# Patient Record
Sex: Male | Born: 1955 | Race: White | Hispanic: No | Marital: Married | Smoking: Never smoker
Health system: Southern US, Community
[De-identification: ages and names within clinical notes are randomized; demographics above are authoritative.]

## PROBLEM LIST (undated history)

## (undated) DIAGNOSIS — Z974 Presence of external hearing-aid: Secondary | ICD-10-CM

## (undated) DIAGNOSIS — N529 Male erectile dysfunction, unspecified: Secondary | ICD-10-CM

## (undated) DIAGNOSIS — Z973 Presence of spectacles and contact lenses: Secondary | ICD-10-CM

## (undated) DIAGNOSIS — K5730 Diverticulosis of large intestine without perforation or abscess without bleeding: Secondary | ICD-10-CM

## (undated) DIAGNOSIS — H905 Unspecified sensorineural hearing loss: Secondary | ICD-10-CM

## (undated) DIAGNOSIS — K649 Unspecified hemorrhoids: Secondary | ICD-10-CM

## (undated) DIAGNOSIS — Z8679 Personal history of other diseases of the circulatory system: Secondary | ICD-10-CM

## (undated) DIAGNOSIS — K4090 Unilateral inguinal hernia, without obstruction or gangrene, not specified as recurrent: Secondary | ICD-10-CM

## (undated) DIAGNOSIS — Z9889 Other specified postprocedural states: Secondary | ICD-10-CM

## (undated) DIAGNOSIS — R011 Cardiac murmur, unspecified: Secondary | ICD-10-CM

## (undated) DIAGNOSIS — I10 Essential (primary) hypertension: Secondary | ICD-10-CM

## (undated) DIAGNOSIS — K642 Third degree hemorrhoids: Secondary | ICD-10-CM

## (undated) DIAGNOSIS — Z8719 Personal history of other diseases of the digestive system: Secondary | ICD-10-CM

## (undated) HISTORY — PX: TRANSTHORACIC ECHOCARDIOGRAM: SHX275

## (undated) HISTORY — PX: MITRAL VALVE ANNULOPLASTY: SHX2038

## (undated) HISTORY — PX: CARDIAC CATHETERIZATION: SHX172

## (undated) HISTORY — DX: Cardiac murmur, unspecified: R01.1

## (undated) HISTORY — DX: Essential (primary) hypertension: I10

---

## 1983-01-18 HISTORY — PX: HEMORRHOID SURGERY: SHX153

## 2003-07-29 ENCOUNTER — Encounter: Admission: RE | Admit: 2003-07-29 | Discharge: 2003-07-29 | Payer: Self-pay | Admitting: Internal Medicine

## 2003-07-31 ENCOUNTER — Inpatient Hospital Stay (HOSPITAL_COMMUNITY): Admission: AD | Admit: 2003-07-31 | Discharge: 2003-08-01 | Payer: Self-pay | Admitting: Internal Medicine

## 2003-07-31 ENCOUNTER — Ambulatory Visit: Payer: Self-pay | Admitting: Internal Medicine

## 2003-08-01 ENCOUNTER — Encounter (INDEPENDENT_AMBULATORY_CARE_PROVIDER_SITE_OTHER): Payer: Self-pay | Admitting: *Deleted

## 2003-08-05 ENCOUNTER — Encounter: Admission: RE | Admit: 2003-08-05 | Discharge: 2003-08-05 | Payer: Self-pay | Admitting: Internal Medicine

## 2003-08-26 ENCOUNTER — Encounter: Admission: RE | Admit: 2003-08-26 | Discharge: 2003-08-26 | Payer: Self-pay | Admitting: Internal Medicine

## 2003-09-10 ENCOUNTER — Encounter: Admission: RE | Admit: 2003-09-10 | Discharge: 2003-09-10 | Payer: Self-pay | Admitting: Internal Medicine

## 2003-09-24 ENCOUNTER — Ambulatory Visit: Payer: Self-pay | Admitting: Internal Medicine

## 2003-10-01 ENCOUNTER — Inpatient Hospital Stay (HOSPITAL_BASED_OUTPATIENT_CLINIC_OR_DEPARTMENT_OTHER): Admission: RE | Admit: 2003-10-01 | Discharge: 2003-10-01 | Payer: Self-pay | Admitting: Cardiovascular Disease

## 2003-10-03 ENCOUNTER — Encounter (INDEPENDENT_AMBULATORY_CARE_PROVIDER_SITE_OTHER): Payer: Self-pay | Admitting: Specialist

## 2003-10-03 ENCOUNTER — Inpatient Hospital Stay (HOSPITAL_COMMUNITY): Admission: RE | Admit: 2003-10-03 | Discharge: 2003-10-07 | Payer: Self-pay | Admitting: Cardiothoracic Surgery

## 2004-07-26 ENCOUNTER — Encounter: Payer: Self-pay | Admitting: Cardiovascular Disease

## 2004-09-22 LAB — HM COLONOSCOPY: HM Colonoscopy: NORMAL

## 2004-09-23 ENCOUNTER — Ambulatory Visit (HOSPITAL_COMMUNITY): Admission: RE | Admit: 2004-09-23 | Discharge: 2004-09-23 | Payer: Self-pay | Admitting: Gastroenterology

## 2005-08-01 ENCOUNTER — Encounter: Admission: RE | Admit: 2005-08-01 | Discharge: 2005-08-01 | Payer: Self-pay | Admitting: Neurology

## 2009-04-22 ENCOUNTER — Ambulatory Visit: Payer: Self-pay | Admitting: Family Medicine

## 2010-02-10 ENCOUNTER — Ambulatory Visit: Payer: Self-pay | Admitting: Cardiovascular Disease

## 2010-02-15 ENCOUNTER — Ambulatory Visit: Payer: Self-pay | Admitting: Cardiovascular Disease

## 2010-04-06 ENCOUNTER — Other Ambulatory Visit: Payer: Self-pay | Admitting: *Deleted

## 2010-04-06 DIAGNOSIS — Z79899 Other long term (current) drug therapy: Secondary | ICD-10-CM

## 2010-04-06 MED ORDER — METOPROLOL SUCCINATE ER 100 MG PO TB24: 50.0000 mg | ORAL_TABLET | Freq: Every day | ORAL | Status: DC

## 2010-06-04 NOTE — H&P (Signed)
NAME:  [name], [name]                           ACCOUNT NO.:  [id number]   MEDICAL RECORD NO.:  [id number]                   PATIENT TYPE:  INP   LOCATION:  NA                                   FACILITY:  [facility]   PHYSICIAN:  Vesta Mixer, M.D.              DATE OF BIRTH:  February 07, 1955   DATE OF ADMISSION:  DATE OF DISCHARGE:                                HISTORY & PHYSICAL   REASON FOR CONSULTATION:  The patient is a 55 year old gentleman with a long  history of mitral valve prolapse.  He recently was admitted to the hospital  with bacterial endocarditis. He has been on a month of IV antibiotics and  his blood has been proven to be sterile with blood cultures.  He now  presents for pre-cath visit prior to his mitral valve replacement surgery.   The patient has a long history of a heart murmur and mitral valve prolapse.  He was seen several months ago and was found to have mitral valve prolapse.  He was admitted to the hospital on July 14 with symptoms of bacterial  endocarditis.  He has made terrific improvement since that time. He now  presents for a pre-cath visit prior to having surgery.   CURRENT MEDICATIONS:  1.  Multivitamin once a day.   ALLERGIES:  He has no known drug allergies.   PAST MEDICAL HISTORY:  1.  Mitral valve prolapse with endocarditis.   SOCIAL HISTORY:  The patient does not smoke and does not drink alcohol to  excess.   FAMILY HISTORY:  His father died at age 58 due to complications related to  Alzheimer's disease.  His mother died at age 25 due to old age.   REVIEW OF SYMPTOMS:  His review of systems was reviewed and is essentially  negative.   PHYSICAL EXAMINATION:  GENERAL:  He is a middle aged gentleman in no acute  distress.  He is alert and oriented times three and his mood and affect are  normal.  VITAL SIGNS:  Weight is 174, blood pressure 120/90 with a heart rate of 76.  HEENT EXAM:  2+ carotids.  He has no JVD and no thyromegaly.  LUNGS:   Clear to auscultation.  CARDIAC:  Heart has a regular rate with S1 and S2.  He has a 3/6 systolic  ejection murmur at the left sternal border.  ABDOMEN:  Good bowel sounds, non-tender.  EXTREMITIES: He has no cyanosis, clubbing or edema.  NEUROLOGIC EXAM:  Nonfocal.  His pulses are intact.   ASSESSMENT AND PLAN:  The patient presents now for a pre-cath visit. We have  discussed the risks, benefits and options. He understands and agrees to  proceed.  Vesta Mixer, M.D.    PJN/MEDQ  D:  09/29/2003  T:  09/29/2003  Job:  [id number]   cc:   Gwenith Daily. Tyrone Sage, M.D.  [address]  [place]  [place] [address]   Teena Irani. Arlyce Dice, M.D.  [address]  [place]  [place] [address]  Fax: [phone]   Fransisco Hertz, M.D.  [address]  [place] [address]  Fax: [phone]

## 2010-06-04 NOTE — Discharge Summary (Signed)
[name], [name]                 ACCOUNT NO.:  [id number]   MEDICAL RECORD NO.:  [id number]          PATIENT TYPE:  INP   LOCATION:  2036                         FACILITY:  [facility]   PHYSICIAN:  Gwenith Daily. Tyrone Sage, M.D.DATE OF BIRTH:  May 14, 1955   DATE OF ADMISSION:  10/03/2003  DATE OF DISCHARGE:  10/07/2003                                 DISCHARGE SUMMARY   ADMITTING DIAGNOSES:  Severe mitral regurgitation.   ADDITIONAL/DISCHARGE DIAGNOSES:  1.  Severe mitral regurgitation status post mitral valve repair completed on      October 03, 2003.  2.  History of hemorrhoid surgery approximately 15 years ago.  3.  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  4.  Short-term anticoagulation therapy for mitral valve repair.   HOSPITAL MANAGEMENT/PROCEDURES:  1.  Mitral valve repair with quadrangle resection of middle scallop of      posterior leaflet and placement of an annuloplasty ring completed on      October 03, 2003 by Dr. Tyrone Sage of [facility].  2.  Transesophageal echocardiogram completed intraoperatively on September      16.  3.  Initiation of cardiac rehabilitation phase I.   CONSULTS:  Cardiac rehabilitation   HISTORY OF PRESENT ILLNESS:  [name] is a pleasant 55 year old white male  who was diagnosed with a heart murmur in March of 2005.  At that time the  patient was noted to have mitral regurgitation.  In June of 2005 the patient  began a fairly sudden onset of fever and chills which were intermittent over  three weeks.  In mid July blood cultures were obtained which grew Strep  viridans and he was evaluated by infectious disease and started on Rocephin.  At that time of that admission the patient was noted to be anemic and had a  significant weight loss of up to 20 pounds.  With the month-long course of  Rocephin the patient regained weight and overall felt much better and  returned to work without difficulty.  Patient had no  specific complaints.  A  TEE was performed and confirmed severe mitral regurgitation.  The patient  was then referred to Dr. Tyrone Sage of [facility] for consideration of mitral valve  repair versus replacement.   Dr. Tyrone Sage saw the patient in consultation on September 23, 2003.  He,  indeed, agreed that the patient had severe mitral regurgitation and had a  history of endocarditis with Strep viridans.  Dr. Tyrone Sage felt that patient  would benefit from mitral valve repair versus replacement.  The risks,  benefits, and alternatives were discussed with the patient and his family at  that time.  The plan was made for the patient to proceed with cardiac  catheterization secondary to the positive family history of cardiac disease.  Patient was in agreement to proceeding with cardiac catheterization and then  ultimately mitral valve repair versus replacement.  Plan was made for the  patient to return to [facility] on Friday, October 03, 2003 for  surgery.   HOSPITAL COURSE:  [name] was admitted  to [facility] on October 03, 2003.  The patient was taken to the operating room and underwent mitral  valve repair with quadrangle resection of middle scallop of posterior  leaflet and placement of an annuloplasty ring.  A transesophageal  echocardiogram was completed intraoperatively and showed that the valve was  functioning well with no evidence of regurgitation.  Overall, the patient  tolerated the procedure well and an On-Q pain medication administration  system was placed prior to closure of the sternotomy incision.  Patient was  then transferred to the surgical intensive care unit in critical, but stable  condition.   Postoperatively the patient made very rapid progress towards recovery.  Patient awoke from anesthesia and was extubated without difficulty on the  night of surgery.  Patient awoke from anesthesia neurologically intact.  He  remained hemodynamically stable and  afebrile.  Patient was initiated on a  regular diet and was tolerating this well.  He resumed normal bowel and  bladder function.  This pain was well controlled with the On-Q system as  well as oral medications.  The patient was initiated on cardiac  rehabilitation phase I and tolerated this quite well.  Patient's incisions  were healing well without evidence of infection.  He was deemed appropriate  for initiation of discharge planning on postoperative day #3 or October 06, 2003.   DISPOSITION:  We will continue his plan with discharge on October 07, 2003  pending a.m. rounds and no change in the patient's clinical status.   PHYSICAL EXAMINATION:  HEART:  Regular rate and rhythm without murmurs,  rubs, or gallops.  He was in a normal sinus rhythm on telemetry.  LUNGS:  Clear to auscultation.  ABDOMEN:  Soft, nontender, nondistended with good bowel sounds.  EXTREMITIES:  Without edema.  He had strong peripheral pulses bilaterally.  His incisions were healing well without evidence of infection.  His sternum  was stable.  The On-Q system was discontinued without difficulty.  External  pacer wires were also discontinued without difficulty.   LABORATORY DATA:  Appropriate laboratory data at the time of discharge are  as follows:  PT/INR from September 19 reads 13.7 and 1.1, respectively.  CBC  from September 19 reads WBC 9.6, hemoglobin 10.1, hematocrit 28.8, platelet  count 112.  BMP from September 19 reads sodium 137, potassium 3.7, chloride  105, CO2 29, glucose 117, BUN 8, creatinine 1.0, calcium 8.2.   Two-view chest x-ray completed on September 19 reads improved aeration of  both lungs with decreased bibasilar atelectasis.  There is decreased  pulmonary vascular congestion.  There were small bilateral pleural effusions  and stable cardiomegaly.   DISCHARGE MEDICATIONS:  1.  Aspirin 81 mg daily.  2.  Lopressor 25 mg b.i.d. 3.  Lasix 40 mg daily for seven days.  4.  K-Dur 20  mEq daily for seven days.  5.  Warfarin 5 mg daily, then as directed by Dr. Harvie Bridge office.  6.  Ultram one to two tablets q.4-6h. as needed for pain.   DISCHARGE INSTRUCTIONS:  1.  Activity:  Patient is to avoid driving.  He should avoid heavy lifting      or strenuous activity.  He should continue to walk daily.  2.  Diet:  The patient should follow a low fat, low salt, heart healthy      diet.  3.  Wound care:  The patient may shower.  He should wash his incisions daily  with soap and water.  He should notify the [facility] office if he has any      redness, swelling, or drainage from his incision sites.   FOLLOWUP APPOINTMENTS:  1.  Patient should plan on having his PT/INR blood work completed on      October 09, 2003 by Dr. Harvie Bridge office.  Patient is to call [phone]      [phone] to make this appointment.  2.  The patient should plan to see Dr. Elease Hashimoto within two weeks of discharge.      The patient should call Dr. Harvie Bridge office at [phone] to make this      appointment date and time.  3.  The patient is scheduled to see Dr. Tyrone Sage on Thursday, October 20 at      11:10 a.m.  Patient should plan on arriving at [facility] at 10:10 a.m. on October 20 to undergo PA and lateral chest x-      ray.  Patient is also instructed to call the [facility] office if he has any      questions or concerns in the meantime.       CAF/MEDQ  D:  10/06/2003  T:  10/07/2003  Job:  [id number]   cc:   Vesta Mixer, M.D.  [address]  [place]  [place] [address]  Fax: [phone]   Madaline Guthrie, M.D.  [address]  [place] [address]  Fax: [phone]   Fransisco Hertz, M.D.  [address]  [place] [address]  Fax: [phone]

## 2010-06-04 NOTE — Op Note (Signed)
NAME:  [name], [name]                           ACCOUNT NO.:  [id number]   MEDICAL RECORD NO.:  [id number]                   PATIENT TYPE:  INP   LOCATION:  2301                                 FACILITY:  [facility]   PHYSICIAN:  Gwenith Daily. Tyrone Sage, M.D.            DATE OF BIRTH:  Oct 31, 1955   DATE OF PROCEDURE:  DATE OF DISCHARGE:                                 OPERATIVE REPORT   PREOPERATIVE DIAGNOSIS:  Severe mitral regurgitation with flail posterior  leaflet and history of endocarditis.   POSTOPERATIVE DIAGNOSIS:  Severe mitral regurgitation with flail posterior  leaflet and history of endocarditis.   OPERATION:  Mitral valve repair with quadrangle resection of middle scallop  of posterior leaflet and placement of an annuloplasty ring.   SURGEON:  Gwenith Daily. Tyrone Sage, M.D.   FIRST ASSISTANT:  Salvatore Decent. Cornelius Moras, M.D.   BRIEF HISTORY:  The patient is a 55 year old male who in the spring of 2005  presented and was noted to have a murmur.  Evaluation of this revealed  significant mitral regurgitation.  Before further evaluation and treatment  was carried out, the patient began having intermittent fevers.  Blood  cultures were obtained and grew Streptococcus viridans.  He was treated for  4 to 6 weeks with IV antibiotics with marked improvement in his overall  symptoms.  Subsequent blood cultures were negative off antibiotics.  Because  of the persistent significant mitral regurgitation, the patient was referred  for surgical treatment and evaluation.  Preoperative transesophageal echo  showed flail posterior leaflet.  The patient had a coronary angiogram, which  showed no evidence of significant coronary obstruction.  Mitral valve repair  and/or replacement was discussed with the patient and recommended.  He  agreed and signed informed consent.   DESCRIPTION OF PROCEDURE:  With Swan-Ganz and arterial line monitors in  place, the patient underwent general endotracheal anesthesia  without  incident.  Skin of chest and legs was prepped with Betadine and draped in  the usual sterile manner.  A median sternotomy was performed; the  pericardium was opened.  Overall ventricular function appeared preserved.  The patient was systemically heparinized.  The ascending aorta was  cannulated.  Superior and inferior vena caval cannulas were placed.  A  retrograde cardioplegia catheter was placed.  The patient was put on  cardiopulmonary bypass, 2.4 L/minute per sq m.  The patient's body  temperature cooled to 30 degrees.  An aortic crossclamp was applied, and 500  mL of cold blood potassium cardioplegia was administered with rapid  diastolic arrest.  The heart and myocardial temperature was monitored  through the crossclamp.  The left atrium was opened along the intra-atrial  groove.  With adequate retraction, good visualization of the mitral valve  was obtained.  There was no evidence of intra-atrial clot.  The left atrial  appendage was sewn shut with a running 4-0 Prolene.  Attention was then  turned to the mitral valve.  The middle scallop of the posterior leaflet was  flail; however, P1 and P3 had good chordal attachments without elongation.  There were no significant vegetations noted.  The anterior leaflet was  slightly thickened with some flecks of calcium in the leaflet, but the  leaflet was very flexible and appear to would coapt well with a repair.  The  middle scallop of the posterior leaflet was excised in a quadrangular  fashion.  A #2 Ti-Cron pledgeted suture was placed in the mitral valve  annulus at the base of the quadrangular resection.  The leaflet edges were  then reapproximated with #5 Ethibond sutures.  After repair of the leaflet,  the heart was passively filled with saline, and there appeared to be good  coaptation and function of the valve.  The space of the anterior leaflet and  size of the anterior leaflet both indicated an annuloplasty ring of 30.   A  Seguin ring, model #SARP-30, serial [id number], was selected.  Using #2 Ti-  Cron sutures, the annuloplasty ring was secured in place.  The repair was  then again tested passively with saline and appeared to be competent.  The  atriotomy was then closed with horizontal mattress 4-0 Prolene sutures.  A  vent was placed across the mitral valve to assist in de-airing the heart.  At completion of the atriotomy, the heart was allowed to passively fill and  de-air through the atriotomy.  The vent was removed and the atriotomy  completed.  Additional cold blood cardioplegia had been administered  intermittently through a retrograde cardioplegia catheter.  At the  completion of the procedure, warm cardioplegia was administered.  The head  was put in a down position, and the aortic crossclamp was removed.  Total  crossclamp time was 107 minutes.  A 16-gauge needle was introduced into the  left ventricular apex to further de-air the heart.  The patient required  electrode defibrillation to return to a sinus rhythm with sinus bradycardia.  Low-dose dopamine was started.  His ventricular rate and sinus rhythm at 80  was maintained.  The patient was then ventilated, and the heart was allowed  to fill with the TEE in place.  Good inspection of the mitral valve was  obtained prior to separation from bypass.  The valve appeared to be  functioning well with no evidence of regurgitation.  During the procedure,  the excised valve was sent to pathology and a portion to microbiology for  further microbiology studies.  Immediate Gram stain revealed no organisms or  white cells.  After separation from bypass, the patient remained  hemodynamically stable.  He was decannulated in the usual fashion.  Protamine sulfate was administered.  With operative field hemostatic, two  atrial and two ventricular pacing wires were __________.  The pericardium was reapproximated.  Two mediastinal tubes were left in place.  The  sternum  was closed with #6 stainless steel wire.  The fascia was closed with  interrupted 0 Vicryl and running 3-0 Vicryl in subcutaneous tissue.  An ON-Q  system was then placed using peel-away sheath in the subcutaneous tissue to  the left and to the right of the incision to assist in postoperative pain  management.  Dry dressings were applied.  The patient tolerated the  procedure without obvious complications.  He was transferred to the surgical  intensive care unit.  He did not require any blood bank blood products  during the operative procedure.  EBG/MEDQ  D:  10/04/2003  T:  10/04/2003  Job:  [id number]   cc:   Vesta Mixer, M.D.  [address], [address]  [place]  [place] [address]  Fax: [phone]   [facility] Office of Infectious Disease

## 2010-06-04 NOTE — Cardiovascular Report (Signed)
NAME:  Ross, Compton                           ACCOUNT NO.:  [id number]   MEDICAL RECORD NO.:  [id number]                   PATIENT TYPE:  OIB   LOCATION:  6501                                 FACILITY:  [facility]   PHYSICIAN:  Vesta Mixer, M.D.              DATE OF BIRTH:  05-23-1955   DATE OF PROCEDURE:  10/01/2003  DATE OF DISCHARGE:                              CARDIAC CATHETERIZATION   [name] is a 55 year old gentleman with a long history of mitral valve  prolapse.  He has known mitral regurgitation.  He recently developed  bacterial endocarditis.  He is now approximately six weeks out from his  diagnosis of bacterial endocarditis and has been treated with appropriate  antibiotics and his blood cultures have been serialized.  He is now referred  for heart catheterization prior to having mitral valve replacement.   PROCEDURE:  Left heart catheterization with coronary angiography and left  ventriculography.   The right femoral artery was easily cannulated using the modified Seldinger  technique.   HEMODYNAMICS:  The left ventricular pressure is 98/7 with an aortic pressure  of 98/60.   CORONARY ANGIOGRAPHY:  1.  Left main coronary artery is smooth and normal.  2.  The left anterior descending artery is smooth and normal.  It is fairly      large in size.  There is several small diagonal branches, all of which      are normal.  3.  The left circumflex artery is a moderate size vessel.  It supplies flow      to a small first obtuse marginal artery and then terminates as it goes      around the AV groove.  It is smooth and normal.  4.  The right coronary artery is extremely large.  It is dominant.  It is      smooth throughout its course.  The right coronary artery supplies      moderate size posterior descending artery and then several large      posterior lateral branches.   LEFT VENTRICULOGRAM:  Left ventriculogram reveals left ventricle that is  mildly enlarged.  There is  overall normal left ventricular systolic  function.  There is at least moderate mitral regurgitation although this  appears to be an under estimation.  There is evidence of mitral valve  prolapse.   COMPLICATIONS:  None.   CONCLUSIONS:  1.  Smooth and normal coronary arteries.  2.  Mildly enlarged left ventricle, but with well preserved left ventricular      systolic function.  We will refer him for mitral valve replacement.                                               Vesta Mixer, M.D.  PJN/MEDQ  D:  10/01/2003  T:  10/01/2003  Job:  [id number]   cc:   Teena Irani. Arlyce Dice, M.D.  [address]  [place]  [place] [address]  Fax: [phone]   Gwenith Daily. Tyrone Sage, M.D.  [address]  [place]  [place] [address]

## 2010-06-04 NOTE — Discharge Summary (Signed)
NAME:  [name], [name]                           ACCOUNT NO.:  [id number]   MEDICAL RECORD NO.:  [id number]                   PATIENT TYPE:  INP   LOCATION:  4711                                 FACILITY:  [facility]   PHYSICIAN:  Madaline Guthrie, M.D.                 DATE OF BIRTH:  06/24/1955   DATE OF ADMISSION:  07/31/2003  DATE OF DISCHARGE:  08/01/2003                                 DISCHARGE SUMMARY   DISCHARGE DIAGNOSES:  1. Bacterial endocarditis.  2. Anemia.  3. History of systolic heart murmur diagnosed in March 2005.  4. Congenital hearing loss.  5. Status post hemorrhoidectomy.   DISCHARGE MEDICATIONS:  1. Ceftriaxone 2 gm IV q.d. x4 weeks.  2. Tylenol p.r.n. fever.   FOLLOW UP:  The patient will return to Dr. Blair Dolphin outpatient clinic on  Tuesday, July 19, at 3 p.m. for followup.  The patient will need to have a  full course of antibiotics and then be seen again by cardiology regarding a  possible mitral valve replacement.  The patient will need to contact his  primary care physician for an appointment to check his hemoglobin within the  next 2-3 weeks.   PROCEDURES:  1. Transesophageal echocardiogram performed on August 01, 2003, showing a     mildly dilated left ventricular with ejection fraction of 65-75%.  There     was marked mitral valve prolapse involving a posterior leaflet and     partially flail, holosystolic mitral valve prolapse involving the lateral     scallop of the posterior leaflet with severe mitral valve regurgitation.     There was a small, 8 mm, mitral valve vegetation.  The left atrium was     mildly to moderately dilated with no atrial appendage thrombus     identified.  2. Peripherally inserted central catheter placed on August 01, 2003, with good     tip placement in the superior vena cava.   CONSULTATIONS:  1. Vesta Mixer, M.D., cardiology.  2. Fransisco Hertz, M.D., infectious disease.   HISTORY OF PRESENT ILLNESS:  The patient is a  55 year old, white male who  presented to Dr. Blair Dolphin clinic with a 4 week history of fever and chills  to 102 degrees and drenching night sweats with associated 12 pound weight  loss over the past month.  On exam, he was noted to have a 4/6 holosystolic  murmur and had an elevated white count of 11.8 with a left shift.  His  hemoglobin was 10.4 and platelets 148.  Blood cultures showed gram-positive  cocci in chains.  He was felt to have bacterial endocarditis.  For a full  H&P, please see the chart and enclosed note.   HOSPITAL COURSE:  Problem 1.  BACTERIAL ENDOCARDITIS:  The patient was  treated empirically with ampicillin and gentamicin until the blood cultures  were identified as a Streptococcus viridans species  susceptible to Rocephin.  With his physical exam findings and the echocardiogram showing a mitral  valve vegetation, he was diagnosed with bacterial endocarditis and set up  with home health for IV antibiotics.  After PICC line placement, it was felt  that he was safe to be discharged for his 4 week course of antibiotics.   Problem 2.  ANEMIA:  The patient presented with a hemoglobin of 10.4.  He  had an elevated ferritin of 831 with low iron of 32, TIBC low at 174 and 18%  saturation.  His Vitamin B12 was normal as well as his TSH.  His red blood  cell folate was 583 in the high normal range.  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   DISCHARGE LABORATORY DATA AND X-RAY FINDINGS:  On the morning of discharge,  the patient's CBC showed a white count of 10.1, hemoglobin 9.6 and platelet  count 154.  His basic metabolic profile showed a sodium of 134, potassium  4.3, chloride 103, CO2 25, BUN 9, creatinine 1.1, glucose 109.  His  urinalysis showed trace blood, but otherwise normal with a specific gravity  of 1.012 and no red cells or white cells on the  microscope.      [name], MD                        Madaline Guthrie, M.D.    WW/MEDQ  D:  08/06/2003  T:  08/07/2003  Job:  [id number]   cc:   Fransisco Hertz, M.D.  [address]  [place] [address]  Fax: [phone]   Cliffton Asters, M.D.  [address] [place]  [place] [address]  Fax: [phone]   Elmore Guise., M.D.  Fax: [phone]   Vesta Mixer, M.D.  [address]  [place]  [place] [address]  Fax: [phone]   Teena Irani. Arlyce Dice, M.D.  [address]  [place]  [place] [address]  Fax: [phone]

## 2010-06-04 NOTE — Op Note (Signed)
NAME:  [name], [name]                           ACCOUNT NO.:  [id number]   MEDICAL RECORD NO.:  [id number]                   PATIENT TYPE:  INP   LOCATION:  2301                                 FACILITY:  [facility]   PHYSICIAN:  Quita Skye. Krista Blue, M.D.               DATE OF BIRTH:  1955-02-03   DATE OF PROCEDURE:  DATE OF DISCHARGE:                                 OPERATIVE REPORT   DATE OF PROCEDURE:  October 03, 2003.   PROCEDURE PERFORMED:  Transesophageal echocardiogram.   SURGEON:  Quita Skye. Krista Blue, MD.   INDICATIONS FOR PROCEDURE:  Mr. [name] is 55 year old white male, who  presents to the operating room for mitral valve repair.  Dr. Sheliah Plane  requested transesophageal echocardiogram for the intraoperative management  of the patient.   PROCEDURE:  Following a routine cardiac induction, the transesophageal probe  was lubricated and covered with a plastic sheath, carefully inserted over a  mouth guard into the patient's esophagus for cardiac imaging.  Overall  images of the heart showed no evidence of pericardial effusion or masses.  The right atrium was evaluated, which was normal in size.  There was no  evidence of atrial septal wall defect by Doppler.  There were no masses or  thrombus noted in the right atrium.  The tricuspid valve appeared normal in  structure with trace tricuspid regurgitation noted.  The pulmonary artery  catheter was noted crossing the valve into the right ventricle.  The right  ventricle appeared to be normal in size without evidence of segmental wall  motion abnormality.  There was no evidence of a ventricular septal wall  defect.  The right atrium was dilated showing 6 x 6 cm.  The left atrial  appendage was normal and there was no evidence of thrombus.  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  The  remainder of the valve also appeared thickened, but there was no evidence of  flail of the anterior  leaflet.  There were noted calcium deposits in both  the anterior and posterior leaflets.  The regurgitant jet was large and  broad trailing back to the posterior wall of the atrium.  Pulmonary vein  demonstrated early systolic reversal in the left upper pulmonary vein.  This  was felt to be severe mitral regurgitation.  The ventricle was then  evaluated, which showed mild enlargement, but normal left ventricular wall  thickness with good overall contractility and a normal ejection fraction  estimated.  The aortic valve had 3 leaflets, appeared structurally normal,  and no evidence of regurg or stenosis.  The aorta was measured and showed  4.4 cm in diameter.  The aorta had no significant atherosclerotic disease  that was noted.  Following repair of the mitral valve, the patient was then  evaluated prior to separation, which showed good function of the mitral  valve repair.  The patient then successfully separated from the bypass  machine, and continued evaluation of the valve showed trace regurgitation  with no evidence of stenosis postoperatively.  The TEE probe remained and  was used to monitor the patient's volume status and was carefully removed at  the end of the surgical case prior to taking the patient to the SICU in good  condition.  Patient tolerated the procedure well.      JDS/MEDQ  D:  10/03/2003  T:  10/05/2003  Job:  [id number]

## 2010-12-22 ENCOUNTER — Telehealth: Payer: Self-pay | Admitting: Family Medicine

## 2010-12-22 NOTE — Telephone Encounter (Signed)
DONE

## 2010-12-23 ENCOUNTER — Encounter (INDEPENDENT_AMBULATORY_CARE_PROVIDER_SITE_OTHER): Payer: Self-pay | Admitting: Surgery

## 2010-12-28 ENCOUNTER — Encounter (INDEPENDENT_AMBULATORY_CARE_PROVIDER_SITE_OTHER): Payer: Self-pay | Admitting: Surgery

## 2011-01-04 ENCOUNTER — Ambulatory Visit (INDEPENDENT_AMBULATORY_CARE_PROVIDER_SITE_OTHER): Payer: Private Health Insurance - Indemnity | Admitting: Surgery

## 2011-01-04 ENCOUNTER — Encounter (INDEPENDENT_AMBULATORY_CARE_PROVIDER_SITE_OTHER): Payer: Self-pay | Admitting: Surgery

## 2011-01-04 VITALS — BP 158/106 | HR 84 | Temp 98.1°F | Resp 16 | Ht 74.0 in | Wt 172.4 lb

## 2011-01-04 DIAGNOSIS — K648 Other hemorrhoids: Secondary | ICD-10-CM

## 2011-01-04 NOTE — Progress Notes (Signed)
Patient ID: [name], male   DOB: 06/05/1955, 55 y.o.   MRN: [id number]  Chief Complaint  Patient presents with  . Other    new pt- eval hems    HPI [name] is a 55 y.o. male.  Referred by Dr.Lalonde for bleeding hemorrhoids HPI   This patient was referred to see Dr. Luisa Hart last year for intermittently bleeding hemorrhoids. He comes back for reevaluation. He did have a hemorrhoidectomy about 20 years ago. He continues to have intermittent problems with painless bleeding with occasional painful bleeding. He denies any constipation. He cannot remember any exacerbating event that causes these flareups. His last colonoscopy was 6 years ago and was normal. He comes in today for surgical evaluation. Past Medical History  Diagnosis Date  . Coronary heart disease   . Hypertension   . Heart murmur   . Hemorrhoids     Past Surgical History  Procedure Date  . Cardiac surgery   . Hemorrhoid surgery     Family History  Problem Relation Age of Onset  . Cancer Brother     pancreatic    Social History History  Substance Use Topics  . Smoking status: Never Smoker   . Smokeless tobacco: Not on file  . Alcohol Use: Yes     social    No Known Allergies  Current Outpatient Prescriptions  Medication Sig Dispense Refill  . aspirin 81 MG tablet Take 81 mg by mouth daily.        . metoprolol (TOPROL XL) 100 MG 24 hr tablet Take 0.5 tablets (50 mg total) by mouth daily.  30 tablet  11    Review of Systems Review of Systems  Constitutional: Negative for fever, chills and unexpected weight change.  HENT: Negative for hearing loss, congestion, sore throat, trouble swallowing and voice change.   Eyes: Negative for visual disturbance.  Respiratory: Negative for cough and wheezing.   Cardiovascular: Negative for chest pain, palpitations and leg swelling.  Gastrointestinal: Positive for blood in stool, anal bleeding and rectal pain. Negative for nausea, vomiting, abdominal pain,  diarrhea, constipation and abdominal distention.  Genitourinary: Negative for hematuria and difficulty urinating.  Musculoskeletal: Negative for arthralgias.  Skin: Negative for rash and wound.  Neurological: Negative for seizures, syncope, weakness and headaches.  Hematological: Negative for adenopathy. Does not bruise/bleed easily.  Psychiatric/Behavioral: Negative for confusion.    Blood pressure 158/106, pulse 84, temperature 98.1 F (36.7 C), temperature source Temporal, resp. rate 16, height 6\' 2"  (1.88 m), weight 172 lb 6.4 oz (78.2 kg).  Physical Exam Physical Exam WDWN in NAD HEENT:  EOMI, sclera anicteric Neck:  No masses, no thyromegaly Lungs:  CTA bilaterally; normal respiratory effort CV:  Regular rate and rhythm; no murmurs Abd:  +bowel sounds, soft, non-tender, no masses Rectal:  Minimal external skin tags; no fistula, abscess or fissure On digital examination, he has a single large prolapsing internal hemorrhoid with some mucosal irritation.  Data Reviewed None  Assessment    Prolapsing internal hemorrhoid    Plan    Recommend hemorrhoidectomy under anesthesia.  The surgical procedure has been discussed with the patient.  Potential risks, benefits, alternative treatments, and expected outcomes have been explained.  All of the patient's questions at this time have been answered.  The likelihood of reaching the patient's treatment goal is good.  The patient understand the proposed surgical procedure and wishes to proceed.        Kyanna Mahrt K. 01/04/2011, 3:24 PM

## 2011-01-04 NOTE — Patient Instructions (Addendum)
Recommend internal hemorrhoidectomy as an outpatient surgery.  Call [phone] to schedule surgery.  [facility]  FULL RECTAL PREP INSTRUCTIONS (for Flexible Sigmoidoscopy / Rectal Surgery)    EVENING PRIOR TO SURGERY:   5:00pm:  Clear Liquid supper (jello-no fruit--, clear soups, tea, coffee)   No milk products.   7:00pm:  Take 2 oz (4 tablespoons) Milk of Magnesia.   DAY OF PROCEDURE:   1-2 hours before leaving house:  Take (2) Fleet enemas.  (These small enemas may be purchased at your local drug store, and may be the "drug store brand".  Do NOT purchase Mineral Oil enemas.  --Try to retain each enema for 5-10 minutes before expelling it.  This should clean your lower colon sufficiently, and the procedure should not need to be repeated.    PATIENTS HAVING SURGERY:  Do not eat or drink anything after midnight the night before your surgery.       If you have questions, please call our office and speak to a member of the clinic staff:  [phone].

## 2011-04-13 ENCOUNTER — Other Ambulatory Visit: Payer: Self-pay | Admitting: *Deleted

## 2011-04-13 MED ORDER — METOPROLOL SUCCINATE ER 100 MG PO TB24: 100.0000 mg | ORAL_TABLET | Freq: Every day | ORAL | Status: DC

## 2011-04-13 NOTE — Telephone Encounter (Signed)
Fax Received. Refill Completed. [name] (R.M.A)   

## 2011-04-19 ENCOUNTER — Telehealth (INDEPENDENT_AMBULATORY_CARE_PROVIDER_SITE_OTHER): Payer: Self-pay | Admitting: Surgery

## 2011-06-09 ENCOUNTER — Encounter (INDEPENDENT_AMBULATORY_CARE_PROVIDER_SITE_OTHER): Payer: Self-pay | Admitting: Surgery

## 2011-06-09 ENCOUNTER — Ambulatory Visit (INDEPENDENT_AMBULATORY_CARE_PROVIDER_SITE_OTHER): Payer: Private Health Insurance - Indemnity | Admitting: Surgery

## 2011-06-09 VITALS — BP 139/86 | HR 76 | Temp 97.4°F | Resp 16 | Ht 74.0 in | Wt 176.2 lb

## 2011-06-09 DIAGNOSIS — K648 Other hemorrhoids: Secondary | ICD-10-CM

## 2011-06-09 NOTE — Progress Notes (Signed)
Patient ID: [name], male   DOB: 07-06-1955, 56 y.o.   MRN: [id number]  Chief Complaint  Patient presents with  . Other    new pt- eval hems    HPI [name] is a 56 y.o. male.  Referred by Dr.Lalonde for bleeding hemorrhoids HPI   This patient was referred to see Dr. Luisa Hart last year for intermittently bleeding hemorrhoids. He comes back for reevaluation. He did have a hemorrhoidectomy about 20 years ago. He continues to have intermittent problems with painless bleeding with occasional painful bleeding. He denies any constipation. He cannot remember any exacerbating event that causes these flareups. His last colonoscopy was 6 years ago and was normal. He comes in today for surgical evaluation.  He was seen in December, but his work schedule precluded surgery, so he comes in today for recheck.  No significant changes in his complaints. Past Medical History  Diagnosis Date  . Coronary heart disease   . Hypertension   . Heart murmur   . Hemorrhoids     Past Surgical History  Procedure Date  . Cardiac surgery   . Hemorrhoid surgery     Family History  Problem Relation Age of Onset  . Cancer Brother     pancreatic    Social History History  Substance Use Topics  . Smoking status: Never Smoker   . Smokeless tobacco: Not on file  . Alcohol Use: Yes     social    No Known Allergies  Current Outpatient Prescriptions  Medication Sig Dispense Refill  . aspirin 81 MG tablet Take 81 mg by mouth daily.        . metoprolol (TOPROL XL) 100 MG 24 hr tablet Take 0.5 tablets (50 mg total) by mouth daily.  30 tablet  11    Review of Systems Review of Systems  Constitutional: Negative for fever, chills and unexpected weight change.  HENT: Negative for hearing loss, congestion, sore throat, trouble swallowing and voice change.   Eyes: Negative for visual disturbance.  Respiratory: Negative for cough and wheezing.   Cardiovascular: Negative for chest pain, palpitations and  leg swelling.  Gastrointestinal: Positive for blood in stool, anal bleeding and rectal pain. Negative for nausea, vomiting, abdominal pain, diarrhea, constipation and abdominal distention.  Genitourinary: Negative for hematuria and difficulty urinating.  Musculoskeletal: Negative for arthralgias.  Skin: Negative for rash and wound.  Neurological: Negative for seizures, syncope, weakness and headaches.  Hematological: Negative for adenopathy. Does not bruise/bleed easily.  Psychiatric/Behavioral: Negative for confusion.    Blood pressure 158/106, pulse 84, temperature 98.1 F (36.7 C), temperature source Temporal, resp. rate 16, height 6\' 2"  (1.88 m), weight 172 lb 6.4 oz (78.2 kg).  Physical Exam Physical Exam WDWN in NAD HEENT:  EOMI, sclera anicteric Neck:  No masses, no thyromegaly Lungs:  CTA bilaterally; normal respiratory effort CV:  Regular rate and rhythm; no murmurs Abd:  +bowel sounds, soft, non-tender, no masses Rectal:  Minimal external skin tags; no fistula, abscess or fissure On digital examination, he has a single large prolapsing internal hemorrhoid with some mucosal irritation.  Data Reviewed None  Assessment    Prolapsing internal hemorrhoid    Plan    Recommend hemorrhoidectomy under anesthesia.  The surgical procedure has been discussed with the patient.  Potential risks, benefits, alternative treatments, and expected outcomes have been explained.  All of the patient's questions at this time have been answered.  The likelihood of reaching the patient's treatment goal is good.  The patient understand the proposed surgical procedure and wishes to proceed.      Wilmon Arms. Corliss Skains, MD, [facility]  06/09/2011 3:10 PM

## 2011-06-09 NOTE — Progress Notes (Signed)
Addended by: Wynona Luna on: 06/09/2011 03:13 PM   Modules accepted: Orders

## 2011-06-09 NOTE — Patient Instructions (Signed)
Call [phone] and ask for our surgery schedulers.

## 2011-08-22 ENCOUNTER — Ambulatory Visit (INDEPENDENT_AMBULATORY_CARE_PROVIDER_SITE_OTHER): Payer: Managed Care, Other (non HMO) | Admitting: Family Medicine

## 2011-08-22 ENCOUNTER — Encounter: Payer: Self-pay | Admitting: Family Medicine

## 2011-08-22 VITALS — BP 110/62 | HR 130 | Temp 98.5°F | Wt 173.0 lb

## 2011-08-22 DIAGNOSIS — K529 Noninfective gastroenteritis and colitis, unspecified: Secondary | ICD-10-CM

## 2011-08-22 DIAGNOSIS — K5289 Other specified noninfective gastroenteritis and colitis: Secondary | ICD-10-CM

## 2011-08-22 NOTE — Progress Notes (Signed)
  Subjective:    Patient ID: [name], male    DOB: 1955-07-30, 56 y.o.   MRN: [id number]  HPI Friday approximately 4 hours after eating chicken and ribs he developed nausea, vomiting, diarrhea followed by fever and chills. Today he is feeling slightly better. He continues to take in plenty of fluids and is urinating regularly.   Review of Systems     Objective:   Physical Exam alert and in no distress. Tympanic membranes and canals are normal. Throat is clear. Tonsils are normal. Neck is supple without adenopathy or thyromegaly. Cardiac exam shows a regular sinus rhythm without murmurs or gallops. Lungs are clear to auscultation. Abdominal exam shows active bowel sounds without masses or tenderness      Assessment & Plan:   1. Acute gastroenteritis    recommend supportive care with Tylenol for fever and chills and Imodium as needed for the diarrhea. He will call if continued difficulty.

## 2011-08-22 NOTE — Patient Instructions (Signed)
Use Tylenol for fever and chills and Imodium for the diarrhea. Call at the end of the week if continued difficulty.

## 2011-08-24 ENCOUNTER — Other Ambulatory Visit: Payer: Self-pay | Admitting: Family Medicine

## 2011-08-24 ENCOUNTER — Encounter: Payer: Self-pay | Admitting: Family Medicine

## 2011-08-24 ENCOUNTER — Ambulatory Visit (INDEPENDENT_AMBULATORY_CARE_PROVIDER_SITE_OTHER): Payer: Managed Care, Other (non HMO) | Admitting: Family Medicine

## 2011-08-24 VITALS — BP 130/80 | HR 124 | Temp 98.2°F | Wt 168.0 lb

## 2011-08-24 DIAGNOSIS — K5289 Other specified noninfective gastroenteritis and colitis: Secondary | ICD-10-CM

## 2011-08-24 DIAGNOSIS — K529 Noninfective gastroenteritis and colitis, unspecified: Secondary | ICD-10-CM

## 2011-08-24 LAB — COMPREHENSIVE METABOLIC PANEL
AST: 57 U/L — ABNORMAL HIGH (ref 0–37)
BUN: 20 mg/dL (ref 6–23)
Creat: 1.29 mg/dL (ref 0.50–1.35)
Glucose, Bld: 113 mg/dL — ABNORMAL HIGH (ref 70–99)
Potassium: 3.8 mEq/L (ref 3.5–5.3)
Sodium: 135 mEq/L (ref 135–145)

## 2011-08-24 LAB — CBC WITH DIFFERENTIAL/PLATELET
Basophils Absolute: 0 10*3/uL (ref 0.0–0.1)
Eosinophils Absolute: 0 10*3/uL (ref 0.0–0.7)
Lymphs Abs: 0.5 10*3/uL — ABNORMAL LOW (ref 0.7–4.0)
MCH: 31 pg (ref 26.0–34.0)
MCV: 88.6 fL (ref 78.0–100.0)
Neutro Abs: 13.4 10*3/uL — ABNORMAL HIGH (ref 1.7–7.7)
Neutrophils Relative %: 93 % — ABNORMAL HIGH (ref 43–77)
Platelets: 186 10*3/uL (ref 150–400)
RBC: 4.9 MIL/uL (ref 4.22–5.81)

## 2011-08-24 MED ORDER — CIPROFLOXACIN HCL 500 MG PO TABS: 500.0000 mg | ORAL_TABLET | Freq: Two times a day (BID) | ORAL | Status: DC

## 2011-08-24 NOTE — Patient Instructions (Signed)
Keep drinking plenty of fluids. Whatever you feel comfortable eating. I will call you tomorrow with the blood results

## 2011-08-24 NOTE — Progress Notes (Signed)
  Subjective:    Patient ID: [name], male    DOB: 1955/12/22, 56 y.o.   MRN: [id number]  HPI He is here for continued difficulty with fever, chills, diarrhea having as many as 6 or more stools per day. He is now experiencing some lower abdominal discomfort. The stool is foul smelling but he is seeing no blood or pus.   Review of Systems     Objective:   Physical Exam alert and in no distress. Tympanic membranes and canals are normal. Throat is clear. Tonsils are normal. Neck is supple without adenopathy or thyromegaly. Cardiac exam shows a regular sinus rhythm without murmurs or gallops. Lungs are clear to auscultation. Donald exam shows decreased bowel sounds with lower abdominal tenderness but no rebound. No masses noted.        Assessment & Plan:   1. Gastroenteritis, acute  CBC with Differential, Comprehensive metabolic panel, ciprofloxacin (CIPRO) 500 MG tablet   I discussed options concerning his diarrhea and treatment. We'll start with Cipro. Discussed possible stool cultures but at this time we'll hold off on it.

## 2011-08-25 ENCOUNTER — Telehealth: Payer: Self-pay | Admitting: Family Medicine

## 2011-08-25 NOTE — Telephone Encounter (Signed)
Talked with his wife Okey Regal gave her word for word of what Dr.Lalonde  Wanted me to let her know she is just very worried because he is so sick I told her as soon as we get results back someone will let her know

## 2011-08-25 NOTE — Telephone Encounter (Signed)
Let him know that his white count is slightly elevated and his liver enzymes are also slightly elevated and that I have asked for further testing concerning the elevated liver enzymes and the possibility of hepatitis. The reason they haven't heard is endoscope and have the results of the hepatitis screen back today

## 2011-08-25 NOTE — Telephone Encounter (Signed)
TALKED WITH WIFE TO INFORM HER HEP LAB IS NOT BACK DR.LALONDE SAID FOR HIM TO KEEP UP ON FLUIDS TYLENOL FOR PAIN EAT WHAT HE WANTS TAKE ANTIBIOTIC AND HE WOULD CHECK LABS IN THE MORNING AND SEND RESULTS TO DIANA TO CALL AND LET HER KNOW RESULTS

## 2011-08-26 ENCOUNTER — Ambulatory Visit (INDEPENDENT_AMBULATORY_CARE_PROVIDER_SITE_OTHER): Payer: Managed Care, Other (non HMO) | Admitting: Medical

## 2011-08-26 ENCOUNTER — Telehealth: Payer: Self-pay | Admitting: Family Medicine

## 2011-08-26 ENCOUNTER — Encounter: Payer: Self-pay | Admitting: Medical

## 2011-08-26 VITALS — BP 108/80 | HR 121 | Temp 98.4°F | Wt 165.0 lb

## 2011-08-26 DIAGNOSIS — R197 Diarrhea, unspecified: Secondary | ICD-10-CM

## 2011-08-26 DIAGNOSIS — R5381 Other malaise: Secondary | ICD-10-CM

## 2011-08-26 DIAGNOSIS — R5383 Other fatigue: Secondary | ICD-10-CM

## 2011-08-26 DIAGNOSIS — R7989 Other specified abnormal findings of blood chemistry: Secondary | ICD-10-CM

## 2011-08-26 DIAGNOSIS — R109 Unspecified abdominal pain: Secondary | ICD-10-CM

## 2011-08-26 LAB — BASIC METABOLIC PANEL
BUN: 24 mg/dL — ABNORMAL HIGH (ref 6–23)
Calcium: 8.9 mg/dL (ref 8.4–10.5)
Chloride: 99 mEq/L (ref 96–112)
Glucose, Bld: 118 mg/dL — ABNORMAL HIGH (ref 70–99)
Potassium: 3.4 mEq/L — ABNORMAL LOW (ref 3.5–5.3)
Sodium: 134 mEq/L — ABNORMAL LOW (ref 135–145)

## 2011-08-26 LAB — HEPATIC FUNCTION PANEL
AST: 38 U/L — ABNORMAL HIGH (ref 0–37)
Albumin: 3 g/dL — ABNORMAL LOW (ref 3.5–5.2)
Alkaline Phosphatase: 103 U/L (ref 39–117)
Total Protein: 6.1 g/dL (ref 6.0–8.3)

## 2011-08-26 LAB — HEPATITIS A ANTIBODY, IGM: Hep A IgM: NEGATIVE

## 2011-08-26 NOTE — Progress Notes (Signed)
St Subjective:    Patient ID: [name], male    DOB: August 04, 1955, 56 y.o.   MRN: [id number]  HPI He is here for recheck.  Has seen Dr. Susann Givens here twice recently for same.  He still has ongoing fatigue, no energy, no appetite, not eating very much.  He was seen recently for above symptoms as well as diarrhea, 6 or more stools daily. Felt like this started after getting food poisoning from something he ate last week out at restaurant.  He has had abdominal bloating, aches in general, night sweats, chills, and not really improving.   At this point he is having less stool as "there is nothing less to poop out."  No urinary urgency, frequency, hematuria or burning.  No chest pain, SOB, blood in stool or urine, no fever.  No sick contacts with the same.  He did begin the Cipro, trying to stay hydrated with water, Gatorade and other liquids.  Denies any recent foreign travel, no sick contacts with the same.  Past Medical History  Diagnosis Date  . Coronary heart disease   . Hypertension   . Heart murmur   . Hemorrhoids    Review of Systems as noted in HPI       Objective:   Physical Exam  Filed Vitals:   08/26/11 1638  BP: 108/80  Pulse: 121  Temp:     General appearance: alert, no distress, WD/WN, fatigued appearing HEENT: normocephalic, sclerae anicteric but conjunctiva somewhat pale, TMs pearly, nares patent, no discharge or erythema, pharynx normal Oral cavity: somewhat dry MM, no lesions Neck: supple, no lymphadenopathy, no thyromegaly, no masses Heart: tachycardic, otherwise RRR, normal S1, S2, no murmurs Lungs: CTA bilaterally, no wheezes, rhonchi, or rales Abdomen: +increased bs, soft, mild to moderate lower abdominal tenderness, non distended, no masses, no hepatomegaly, no splenomegaly Pulses: 2+ symmetric, upper and lower extremities, normal cap refill     Assessment & Plan:   Encounter Diagnoses  Name Primary?  . Diarrhea Yes  . Abdominal pain   . Fatigue   .  Elevated LFTs    Reviewed orthostatic vitals showed no significant orthostatic drop.  Reviewed his recent labs done earlier in the week, hepatitis A lab still pending. Additional stat labs today, c/t Cipro, discussed hydration, BRAT diet, and will call with lab results later today.  Etiology still seems gastroenteritis vs other cause.  In addition to stat labs, stool collection kit sent home for him to collect and return for stool studies.  If worsening, will probably need IV fluids and abdominal imaging for starters.

## 2011-08-26 NOTE — Telephone Encounter (Signed)
Per Dr. Susann Givens, called patient to see how he is doing. Patient states he is not a lot better, extremely weak, still can;t eat, pushing fluids which is causing bloating, urine very dark Gave this update to Dr. Susann Givens via phone, per Dr. Susann Givens scheduled patient to come in this morning to see Vincenza Hews, check for dehydration, check urine, get STAT CMET,  He wants to check liver enzymes. Patient has appointment with Vincenza Hews today

## 2011-08-27 ENCOUNTER — Emergency Department (HOSPITAL_COMMUNITY): Payer: Managed Care, Other (non HMO)

## 2011-08-27 ENCOUNTER — Encounter (HOSPITAL_COMMUNITY): Payer: Self-pay | Admitting: Emergency Medicine

## 2011-08-27 ENCOUNTER — Encounter: Payer: Self-pay | Admitting: Medical

## 2011-08-27 ENCOUNTER — Inpatient Hospital Stay (HOSPITAL_COMMUNITY)
Admission: EM | Admit: 2011-08-27 | Discharge: 2011-09-15 | DRG: 330 | Disposition: A | Discharge status: Home / Self Care | Payer: Managed Care, Other (non HMO) | Attending: Surgery | Admitting: Surgery

## 2011-08-27 DIAGNOSIS — E46 Unspecified protein-calorie malnutrition: Secondary | ICD-10-CM | POA: Diagnosis present

## 2011-08-27 DIAGNOSIS — I10 Essential (primary) hypertension: Secondary | ICD-10-CM | POA: Diagnosis present

## 2011-08-27 DIAGNOSIS — K5792 Diverticulitis of intestine, part unspecified, without perforation or abscess without bleeding: Secondary | ICD-10-CM

## 2011-08-27 DIAGNOSIS — K567 Ileus, unspecified: Secondary | ICD-10-CM | POA: Diagnosis not present

## 2011-08-27 DIAGNOSIS — K631 Perforation of intestine (nontraumatic): Secondary | ICD-10-CM

## 2011-08-27 DIAGNOSIS — K560 Paralytic ileus: Secondary | ICD-10-CM | POA: Diagnosis not present

## 2011-08-27 DIAGNOSIS — K5732 Diverticulitis of large intestine without perforation or abscess without bleeding: Secondary | ICD-10-CM

## 2011-08-27 DIAGNOSIS — IMO0002 Reserved for concepts with insufficient information to code with codable children: Secondary | ICD-10-CM

## 2011-08-27 DIAGNOSIS — K630 Abscess of intestine: Secondary | ICD-10-CM | POA: Diagnosis present

## 2011-08-27 DIAGNOSIS — K5720 Diverticulitis of large intestine with perforation and abscess without bleeding: Secondary | ICD-10-CM | POA: Diagnosis present

## 2011-08-27 DIAGNOSIS — K9189 Other postprocedural complications and disorders of digestive system: Secondary | ICD-10-CM | POA: Diagnosis not present

## 2011-08-27 DIAGNOSIS — I2510 Atherosclerotic heart disease of native coronary artery without angina pectoris: Secondary | ICD-10-CM | POA: Diagnosis present

## 2011-08-27 DIAGNOSIS — Z7982 Long term (current) use of aspirin: Secondary | ICD-10-CM

## 2011-08-27 DIAGNOSIS — A0472 Enterocolitis due to Clostridium difficile, not specified as recurrent: Secondary | ICD-10-CM | POA: Diagnosis present

## 2011-08-27 DIAGNOSIS — Z79899 Other long term (current) drug therapy: Secondary | ICD-10-CM

## 2011-08-27 LAB — URINALYSIS, ROUTINE W REFLEX MICROSCOPIC
Bilirubin Urine: NEGATIVE
Ketones, ur: NEGATIVE mg/dL
Leukocytes, UA: NEGATIVE
Nitrite: NEGATIVE
Protein, ur: 30 mg/dL — AB
Urobilinogen, UA: 0.2 mg/dL (ref 0.0–1.0)
pH: 6 (ref 5.0–8.0)

## 2011-08-27 LAB — COMPREHENSIVE METABOLIC PANEL
ALT: 49 U/L (ref 0–53)
Alkaline Phosphatase: 127 U/L — ABNORMAL HIGH (ref 39–117)
BUN: 25 mg/dL — ABNORMAL HIGH (ref 6–23)
CO2: 27 mEq/L (ref 19–32)
GFR calc Af Amer: 69 mL/min — ABNORMAL LOW (ref >90)
GFR calc non Af Amer: 59 mL/min — ABNORMAL LOW (ref >90)
Glucose, Bld: 132 mg/dL — ABNORMAL HIGH (ref 70–99)
Potassium: 3.7 mEq/L (ref 3.5–5.1)
Total Bilirubin: 0.5 mg/dL (ref 0.3–1.2)
Total Protein: 7.5 g/dL (ref 6.0–8.3)

## 2011-08-27 LAB — CBC WITH DIFFERENTIAL/PLATELET
Basophils Relative: 1 % (ref 0–1)
Eosinophils Absolute: 0 10*3/uL (ref 0.0–0.7)
Eosinophils Relative: 0 % (ref 0–5)
HCT: 44.2 % (ref 39.0–52.0)
Hemoglobin: 16.1 g/dL (ref 13.0–17.0)
Lymphocytes Relative: 4 % — ABNORMAL LOW (ref 12–46)
MCH: 32.2 pg (ref 26.0–34.0)
MCHC: 36.4 g/dL — ABNORMAL HIGH (ref 30.0–36.0)
Monocytes Absolute: 0.7 10*3/uL (ref 0.1–1.0)
Neutro Abs: 15.3 10*3/uL — ABNORMAL HIGH (ref 1.7–7.7)
RBC: 5 MIL/uL (ref 4.22–5.81)

## 2011-08-27 LAB — URINE MICROSCOPIC-ADD ON

## 2011-08-27 MED ORDER — PANTOPRAZOLE SODIUM 40 MG IV SOLR
40.0000 mg | Freq: Every day | INTRAVENOUS | Status: DC
  Administered 2011-08-27 – 2011-08-28 (×2): 40 mg via INTRAVENOUS
  Filled 2011-08-27 (×3): qty 40

## 2011-08-27 MED ORDER — PIPERACILLIN-TAZOBACTAM 3.375 G IVPB
3.3750 g | Freq: Once | INTRAVENOUS | Status: DC
  Administered 2011-08-27: 3.375 g via INTRAVENOUS
  Filled 2011-08-27: qty 50

## 2011-08-27 MED ORDER — MORPHINE SULFATE 2 MG/ML IJ SOLN
2.0000 mg | INTRAMUSCULAR | Status: DC | PRN
  Administered 2011-08-28: 2 mg via INTRAVENOUS
  Administered 2011-08-31: 4 mg via INTRAVENOUS
  Filled 2011-08-27: qty 1
  Filled 2011-08-27: qty 2

## 2011-08-27 MED ORDER — METOCLOPRAMIDE HCL 5 MG/ML IJ SOLN
10.0000 mg | Freq: Once | INTRAMUSCULAR | Status: AC
  Administered 2011-08-27: 10 mg via INTRAVENOUS
  Filled 2011-08-27: qty 2

## 2011-08-27 MED ORDER — SODIUM CHLORIDE 0.9 % IV BOLUS (SEPSIS)
1000.0000 mL | Freq: Once | INTRAVENOUS | Status: AC
  Administered 2011-08-27: 1000 mL via INTRAVENOUS

## 2011-08-27 MED ORDER — SODIUM CHLORIDE 0.9 % IV SOLN
1.0000 g | INTRAVENOUS | Status: DC
  Administered 2011-08-27 – 2011-09-06 (×11): 1 g via INTRAVENOUS
  Filled 2011-08-27 (×13): qty 1

## 2011-08-27 MED ORDER — ONDANSETRON HCL 4 MG/2ML IJ SOLN
4.0000 mg | Freq: Once | INTRAMUSCULAR | Status: AC
  Administered 2011-08-27: 4 mg via INTRAVENOUS
  Filled 2011-08-27: qty 2

## 2011-08-27 MED ORDER — METOPROLOL TARTRATE 1 MG/ML IV SOLN
2.5000 mg | Freq: Four times a day (QID) | INTRAVENOUS | Status: DC
  Administered 2011-08-27 – 2011-08-30 (×11): 2.5 mg via INTRAVENOUS
  Filled 2011-08-27 (×15): qty 5

## 2011-08-27 MED ORDER — DEXTROSE IN LACTATED RINGERS 5 % IV SOLN
INTRAVENOUS | Status: DC
  Administered 2011-08-27 – 2011-08-28 (×4): via INTRAVENOUS

## 2011-08-27 MED ORDER — ONDANSETRON HCL 4 MG/2ML IJ SOLN: 4.0000 mg | INTRAMUSCULAR | Status: DC | PRN

## 2011-08-27 MED ORDER — IOHEXOL 300 MG/ML  SOLN
100.0000 mL | Freq: Once | INTRAMUSCULAR | Status: AC | PRN
  Administered 2011-08-27: 100 mL via INTRAVENOUS

## 2011-08-27 NOTE — ED Notes (Signed)
Pt states he is unable to urinate at this time.

## 2011-08-27 NOTE — ED Provider Notes (Signed)
History     CSN: [id number]  Arrival date & time 08/27/11  1107   First MD Initiated Contact with Patient 08/27/11 1122      Chief Complaint  Patient presents with  . Abdominal Pain    (Consider location/radiation/quality/duration/timing/severity/associated sxs/prior treatment) HPI Comments: 56 y/o male presents with worsening abdominal pain, nausea and vomiting x 1 week. States he thought he had food poisoning after going out to eat with his wife last weekend so we went to PCP who told him to watch for worsening s/s. Over the next few days his nausea and vomiting worsened along with diarrhea. He had a fever and chills which has subsided. Went back to PCP on Wednesday and was prescribed Cipro. Labs according to patient showed elevated white count. Symptoms continued to worsen, he has no appetite, lost 10 lbs in the past week. Went back to PCP yesterday who told him to go to ER if symptoms persist. Admits to feeling very tired and weak. Abdomen only painful when he moves. Nausea is bothering him the most. Denies any blood in stool or hematochezia. No chest pain or sob.  The history is provided by the patient and the spouse.    Past Medical History  Diagnosis Date  . Coronary heart disease   . Hypertension   . Heart murmur   . Hemorrhoids     Past Surgical History  Procedure Date  . Hemorrhoid surgery   . Cardiac surgery 2006    mitral  valve repair    Family History  Problem Relation Age of Onset  . Cancer Brother     pancreatic    History  Substance Use Topics  . Smoking status: Never Smoker   . Smokeless tobacco: Not on file  . Alcohol Use: 7.0 oz/week    14 drink(s) per week     vodka      Review of Systems  Constitutional: Positive for appetite change and fatigue. Fever: subsided.  Gastrointestinal: Positive for nausea, vomiting, abdominal pain and diarrhea. Negative for blood in stool.    Allergies  Review of patient's allergies indicates no known  allergies.  Home Medications   Current Outpatient Rx  Name Route Sig Dispense Refill  . ASPIRIN 81 MG PO TABS Oral Take 81 mg by mouth daily.      [name] CIPROFLOXACIN HCL 500 MG PO TABS Oral Take 500 mg by mouth 2 (two) times daily.    [name] METOPROLOL SUCCINATE ER 100 MG PO TB24 Oral Take 50 mg by mouth daily. Take 1/2 with or immediately following a meal.    . METOPROLOL SUCCINATE ER 100 MG PO TB24 Oral Take 0.5 tablets (50 mg total) by mouth daily. 30 tablet 11    BP 125/75  Pulse 130  Temp 98.1 F (36.7 C) (Oral)  SpO2 98%  Physical Exam  Constitutional: He is oriented to person, place, and time. He appears well-developed and well-nourished. He appears ill.  HENT:  Head: Normocephalic and atraumatic.  Mouth/Throat: Oropharynx is clear and moist and mucous membranes are normal.  Eyes: Conjunctivae are normal. No scleral icterus.  Cardiovascular: Regular rhythm and normal heart sounds.  Tachycardia present.   Pulmonary/Chest: Effort normal and breath sounds normal.  Abdominal: Soft. Normal appearance and bowel sounds are normal. He exhibits no distension. There is tenderness (generalized, but more prominent in LUQ and LLQ) in the left upper quadrant and left lower quadrant. There is guarding. There is no rigidity and no rebound.  Neurological: He is  alert and oriented to person, place, and time.  Skin: Skin is warm and dry. No rash noted. He is not diaphoretic.  Psychiatric: He has a normal mood and affect. His speech is normal and behavior is normal.    ED Course  Procedures (including critical care time)   Labs Reviewed  CBC WITH DIFFERENTIAL  COMPREHENSIVE METABOLIC PANEL  URINALYSIS, ROUTINE W REFLEX MICROSCOPIC  LIPASE, BLOOD   Results for orders placed during the hospital encounter of 08/27/11  CBC WITH DIFFERENTIAL      Component Value Range   WBC 16.9 (*) 4.0 - 10.5 K/uL   RBC 5.00  4.22 - 5.81 MIL/uL   Hemoglobin 16.1  13.0 - 17.0 g/dL   HCT 45.4  09.8 - 11.9 %    MCV 88.4  78.0 - 100.0 fL   MCH 32.2  26.0 - 34.0 pg   MCHC 36.4 (*) 30.0 - 36.0 g/dL   RDW 14.7  82.9 - 56.2 %   Platelets 321  150 - 400 K/uL   Neutrophils Relative 91 (*) 43 - 77 %   Lymphocytes Relative 4 (*) 12 - 46 %   Monocytes Relative 4  3 - 12 %   Eosinophils Relative 0  0 - 5 %   Basophils Relative 1  0 - 1 %   Neutro Abs 15.3 (*) 1.7 - 7.7 K/uL   Lymphs Abs 0.7  0.7 - 4.0 K/uL   Monocytes Absolute 0.7  0.1 - 1.0 K/uL   Eosinophils Absolute 0.0  0.0 - 0.7 K/uL   Basophils Absolute 0.2 (*) 0.0 - 0.1 K/uL   WBC Morphology MILD LEFT SHIFT (1-5% METAS, OCC MYELO, OCC BANDS)    COMPREHENSIVE METABOLIC PANEL      Component Value Range   Sodium 132 (*) 135 - 145 mEq/L   Potassium 3.7  3.5 - 5.1 mEq/L   Chloride 92 (*) 96 - 112 mEq/L   CO2 27  19 - 32 mEq/L   Glucose, Bld 132 (*) 70 - 99 mg/dL   BUN 25 (*) 6 - 23 mg/dL   Creatinine, Ser 1.30  0.50 - 1.35 mg/dL   Calcium 9.7  8.4 - 86.5 mg/dL   Total Protein 7.5  6.0 - 8.3 g/dL   Albumin 2.6 (*) 3.5 - 5.2 g/dL   AST 35  0 - 37 U/L   ALT 49  0 - 53 U/L   Alkaline Phosphatase 127 (*) 39 - 117 U/L   Total Bilirubin 0.5  0.3 - 1.2 mg/dL   GFR calc non Af Amer 59 (*) >90 mL/min   GFR calc Af Amer 69 (*) >90 mL/min  LIPASE, BLOOD      Component Value Range   Lipase 59  11 - 59 U/L    Ct Abdomen Pelvis W Contrast  08/27/2011  *RADIOLOGY REPORT*  Clinical Data: Abdominal pain, nausea/vomiting/diarrhea  CT ABDOMEN AND PELVIS WITH CONTRAST  Technique:  Multidetector CT imaging of the abdomen and pelvis was performed following the standard protocol during bolus administration of intravenous contrast.  Contrast: OMNIPAQUE IOHEXOL 300 MG/ML  SOLN  Comparison: None.  Findings: Lung bases are clear.  Liver, spleen, pancreas, and adrenal glands are within normal limits.  Gallbladder is underdistended.  No intrahepatic or extrahepatic ductal dilatation.  Kidneys are notable for a tiny probable right upper pole cyst.  No hydronephrosis.   The vast majority of the small bowel and colon are diffusely abnormal.  There are multiple loops of  mildly dilated proximal small bowel in the left upper abdomen, likely reflecting ileus. There is mucosal edema involving the mid/distal small bowel diffusely.  There is pancolonic wall thickening.  There are numerous scattered colonic diverticuli.  Scattered foci of free air, most prominently just superior to the sigmoid colon (series 2/images 59 and 62), suggesting sigmoid diverticulitis or colitis with perforation.  There is a 2.1 x 1.6 cm fluid collection in the left pelvis (series 2/image 66), worrisome for early pericolonic abscess.  Scattered inflammatory changes with mucosal edema and small volume abdominopelvic ascites.  No evidence of abdominal aortic aneurysm.  The celiac artery, SMA, and IMA are patent.  No suspicious abdominopelvic lymphadenopathy.  Prostatomegaly, measuring 6.1 cm in transverse dimension.  Bladder is within normal limits.  Mild degenerative changes of the visualized thoracolumbar spine.  IMPRESSION: Findings most suggestive of perforated sigmoid colitis or diverticulitis.  Additional abnormal loops of small bowel and colon with mucosal thickening/edema, as described above.  Possible 2.1 x 1.6 cm early pericolonic abscess.  These results were called by telephone on 08/27/2011 at 1425 hours to Dr. Anitra Lauth, who verbally acknowledged these results.  Original Report Authenticated By: Charline Bills, M.D.     1. Perforated bowel   2. Diverticulitis       MDM  56 y/o male with abdominal pain x 1 week. Symptoms worsening despite being put on cipro by PCP. Will obtain labs, give fluids and zofran, and obtain CT scan for suspected diverticulitis. 2:43 PM CT scan showing perforated sigmoid colitis or diverticulitis. Zosyn started. zofran not helping patient's nausea, switched to reglan. Dr. Anitra Lauth consulted surgery. Patient will be admitted.       Trevor Mace,  PA-C 08/27/11 1505

## 2011-08-27 NOTE — H&P (Signed)
[name] is an 56 y.o. male.   Chief Complaint: Lower abdominal pain with n/v HPI:   He developed the onset of lower abdominal pain with nausea and vomiting about a week ago. At first he had some fever but then this resolved. He saw his primary care physician multiple times over the last 5 days and was eventually put on ciprofloxacin. However, the pain and nausea vomiting continued and he presented to the emergency department for further evaluation. He had a leukocytosis. Evaluation included a CT scan which demonstrated findings consistent with sigmoid diverticulitis with some  foci of air around the sigmoid area and a small area in the pelvis suspicious for an evolving abscess. He states he's not been able to take any this medication since he got sick and so has not been on his metoprolol.  His primary care physician told him that his white blood count was elevated. He has not had anything like this before. He had a colonoscopy at the age of 82 and states nothing suspicious was found. He feels somewhat weak as he is not been able to keep anything down for about a week.  Past Medical History  Diagnosis Date  . Coronary heart disease   . Hypertension   . Heart murmur   . Hemorrhoids     Past Surgical History  Procedure Date  . Hemorrhoid surgery   . Cardiac surgery 2006    mitral  valve repair    Family History  Problem Relation Age of Onset  . Cancer Brother     pancreatic   Social History:  reports that he has never smoked. He does not have any smokeless tobacco history on file. He reports that he drinks about 7 ounces of alcohol per week. He reports that he does not use illicit drugs.  Allergies: No Known Allergies   (Not in a hospital admission)  Results for orders placed during the hospital encounter of 08/27/11 (from the past 48 hour(s))  CBC WITH DIFFERENTIAL     Status: Abnormal   Collection Time   08/27/11 11:36 AM      Component Value Range Comment   WBC 16.9 (*) 4.0  - 10.5 K/uL    RBC 5.00  4.22 - 5.81 MIL/uL    Hemoglobin 16.1  13.0 - 17.0 g/dL    HCT 16.1  09.6 - 04.5 %    MCV 88.4  78.0 - 100.0 fL    MCH 32.2  26.0 - 34.0 pg    MCHC 36.4 (*) 30.0 - 36.0 g/dL    RDW 40.9  81.1 - 91.4 %    Platelets 321  150 - 400 K/uL    Neutrophils Relative 91 (*) 43 - 77 %    Lymphocytes Relative 4 (*) 12 - 46 %    Monocytes Relative 4  3 - 12 %    Eosinophils Relative 0  0 - 5 %    Basophils Relative 1  0 - 1 %    Neutro Abs 15.3 (*) 1.7 - 7.7 K/uL    Lymphs Abs 0.7  0.7 - 4.0 K/uL    Monocytes Absolute 0.7  0.1 - 1.0 K/uL    Eosinophils Absolute 0.0  0.0 - 0.7 K/uL    Basophils Absolute 0.2 (*) 0.0 - 0.1 K/uL    WBC Morphology MILD LEFT SHIFT (1-5% METAS, OCC MYELO, OCC BANDS)     COMPREHENSIVE METABOLIC PANEL     Status: Abnormal   Collection Time  08/27/11 11:36 AM      Component Value Range Comment   Sodium 132 (*) 135 - 145 mEq/L    Potassium 3.7  3.5 - 5.1 mEq/L    Chloride 92 (*) 96 - 112 mEq/L    CO2 27  19 - 32 mEq/L    Glucose, Bld 132 (*) 70 - 99 mg/dL    BUN 25 (*) 6 - 23 mg/dL    Creatinine, Ser 7.82  0.50 - 1.35 mg/dL    Calcium 9.7  8.4 - 95.6 mg/dL    Total Protein 7.5  6.0 - 8.3 g/dL    Albumin 2.6 (*) 3.5 - 5.2 g/dL    AST 35  0 - 37 U/L    ALT 49  0 - 53 U/L    Alkaline Phosphatase 127 (*) 39 - 117 U/L    Total Bilirubin 0.5  0.3 - 1.2 mg/dL    GFR calc non Af Amer 59 (*) >90 mL/min    GFR calc Af Amer 69 (*) >90 mL/min   LIPASE, BLOOD     Status: Normal   Collection Time   08/27/11 11:36 AM      Component Value Range Comment   Lipase 59  11 - 59 U/L    Ct Abdomen Pelvis W Contrast  08/27/2011  *RADIOLOGY REPORT*  Clinical Data: Abdominal pain, nausea/vomiting/diarrhea  CT ABDOMEN AND PELVIS WITH CONTRAST  Technique:  Multidetector CT imaging of the abdomen and pelvis was performed following the standard protocol during bolus administration of intravenous contrast.  Contrast: OMNIPAQUE IOHEXOL 300 MG/ML  SOLN   Comparison: None.  Findings: Lung bases are clear.  Liver, spleen, pancreas, and adrenal glands are within normal limits.  Gallbladder is underdistended.  No intrahepatic or extrahepatic ductal dilatation.  Kidneys are notable for a tiny probable right upper pole cyst.  No hydronephrosis.  The vast majority of the small bowel and colon are diffusely abnormal.  There are multiple loops of mildly dilated proximal small bowel in the left upper abdomen, likely reflecting ileus. There is mucosal edema involving the mid/distal small bowel diffusely.  There is pancolonic wall thickening.  There are numerous scattered colonic diverticuli.  Scattered foci of free air, most prominently just superior to the sigmoid colon (series 2/images 59 and 62), suggesting sigmoid diverticulitis or colitis with perforation.  There is a 2.1 x 1.6 cm fluid collection in the left pelvis (series 2/image 66), worrisome for early pericolonic abscess.  Scattered inflammatory changes with mucosal edema and small volume abdominopelvic ascites.  No evidence of abdominal aortic aneurysm.  The celiac artery, SMA, and IMA are patent.  No suspicious abdominopelvic lymphadenopathy.  Prostatomegaly, measuring 6.1 cm in transverse dimension.  Bladder is within normal limits.  Mild degenerative changes of the visualized thoracolumbar spine.  IMPRESSION: Findings most suggestive of perforated sigmoid colitis or diverticulitis.  Additional abnormal loops of small bowel and colon with mucosal thickening/edema, as described above.  Possible 2.1 x 1.6 cm early pericolonic abscess.  These results were called by telephone on 08/27/2011 at 1425 hours to Dr. Anitra Lauth, who verbally acknowledged these results.  Original Report Authenticated By: Charline Bills, M.D.    Review of Systems  Constitutional: Positive for fever and malaise/fatigue.  Respiratory: Negative for cough and shortness of breath.   Cardiovascular: Negative for chest pain.  Gastrointestinal:  Positive for nausea, vomiting, abdominal pain and diarrhea. Negative for blood in stool.  Genitourinary: Negative for dysuria and hematuria.  Neurological: Positive for weakness.  Negative for seizures and headaches.  Endo/Heme/Allergies: Does not bruise/bleed easily.    Blood pressure 125/75, pulse 130, temperature 98.1 F (36.7 C), temperature source Oral, SpO2 98.00%. Physical Exam  Constitutional: He appears well-developed and well-nourished. No distress.       He is pleasant and cooperative and lying in the bed with his hands behind his head. He does not appear to be uncomfortable.  HENT:  Head: Normocephalic and atraumatic.       Mucous membranes are dry.  Eyes: EOM are normal. No scleral icterus.  Neck: Neck supple.  Cardiovascular:       Increased rate with a regular rhythm.  Respiratory: Effort normal and breath sounds normal.       Mid sternal scar  GI: Soft. He exhibits distension. He exhibits no mass. There is tenderness (in both lower quadrants but not diffusely). There is no guarding.  Genitourinary:       Left inguinal scar  Musculoskeletal: He exhibits no edema.  Lymphadenopathy:    He has no cervical adenopathy.  Neurological: He is alert.  Skin: Skin is warm and dry. No rash noted.  Psychiatric:       He is moderately anxious     Assessment/Plan Acute sigmoid diverticulitis with microperforation that is fairly focal with a possible early abscess formation. He also has a reactive ileus. Clinically, he does not have peritonitis or appear to be septic.  Plan: Admit to the hospital and start broad-spectrum antibiotics. IV fluid hydration. If he does not get better or gets worse he may need a laparoscopy and washout with drain placement or exploratory laparotomy with partial colectomy and colostomy. This has been explained to him in detail. He seems to understand the plan and agrees with it.  Amethyst Gainer J 08/27/2011, 3:36 PM

## 2011-08-27 NOTE — ED Notes (Signed)
Pt sent by PCP, has been sick w/ abd pain, N/V/D since last Saturday. Has seen PCP 3 times this week and placed on cipro and pt also submitted stool samples.

## 2011-08-27 NOTE — ED Provider Notes (Signed)
Medical screening examination/treatment/procedure(s) were conducted as a shared visit with non-physician practitioner(s) and myself.  I personally evaluated the patient during the encounter   Gwyneth Sprout, MD 08/27/11 [phone]

## 2011-08-27 NOTE — ED Notes (Signed)
Pt's wife stated that the pt just told her that he vomited while in the restroom. Pt also stated that he could not give a urine sample at this time.

## 2011-08-27 NOTE — ED Notes (Signed)
Pt reports developing nausea and vomiting a week ago Friday. States that he went to PCP on Wednesday and MD put pt on Cipro for GI infection. Pt reports having profuse, malodorous diarrhea after taking Cipro.

## 2011-08-27 NOTE — ED Notes (Signed)
Patient transported to CT 

## 2011-08-28 ENCOUNTER — Encounter (HOSPITAL_COMMUNITY): Payer: Self-pay | Admitting: *Deleted

## 2011-08-28 LAB — CBC
MCV: 88.8 fL (ref 78.0–100.0)
Platelets: 269 10*3/uL (ref 150–400)
RBC: 3.93 MIL/uL — ABNORMAL LOW (ref 4.22–5.81)
RDW: 14.1 % (ref 11.5–15.5)
WBC: 13 10*3/uL — ABNORMAL HIGH (ref 4.0–10.5)

## 2011-08-28 LAB — BASIC METABOLIC PANEL
CO2: 26 mEq/L (ref 19–32)
Calcium: 8 mg/dL — ABNORMAL LOW (ref 8.4–10.5)
GFR calc Af Amer: 84 mL/min — ABNORMAL LOW (ref >90)
Sodium: 133 mEq/L — ABNORMAL LOW (ref 135–145)

## 2011-08-28 LAB — FECAL LACTOFERRIN, QUANT

## 2011-08-28 MED ORDER — METRONIDAZOLE 250 MG PO TABS
250.0000 mg | ORAL_TABLET | Freq: Three times a day (TID) | ORAL | Status: DC
  Administered 2011-08-28 – 2011-09-01 (×12): 250 mg via ORAL
  Filled 2011-08-28 (×15): qty 1

## 2011-08-28 MED ORDER — KCL IN DEXTROSE-NACL 20-5-0.9 MEQ/L-%-% IV SOLN
INTRAVENOUS | Status: DC
  Administered 2011-08-28 – 2011-08-29 (×5): via INTRAVENOUS
  Filled 2011-08-28 (×7): qty 1000

## 2011-08-28 MED ORDER — SODIUM CHLORIDE 0.9 % IV BOLUS (SEPSIS)
500.0000 mL | Freq: Once | INTRAVENOUS | Status: AC
  Administered 2011-08-28: 500 mL via INTRAVENOUS

## 2011-08-28 NOTE — Progress Notes (Signed)
Subjective: Feels much better.  Less pain.  Less bloating.  Objective: Vital signs in last 24 hours: Temp:  [98.1 F (36.7 C)-99.2 F (37.3 C)] 98.9 F (37.2 C) (08/11 0435) Pulse Rate:  [104-130] 120  (08/11 0658) Resp:  [16-22] 20  (08/11 0435) BP: (112-129)/(75-85) 117/81 mmHg (08/11 0658) SpO2:  [96 %-98 %] 96 % (08/11 0435) Weight:  [165 lb 9.1 oz (75.1 kg)] 165 lb 9.1 oz (75.1 kg) (08/10 2030) Last BM Date: 08/27/11  Intake/Output from previous day: 08/10 0701 - 08/11 0700 In: 3066.7 [P.O.:240; I.V.:2826.7] Out: 200 [Stool:200] Intake/Output this shift:    PE: Abd-soft, nontender this AM, less distended.  Lab Results:   Basename 08/28/11 0435 08/27/11 1136  WBC 13.0* 16.9*  HGB 11.9* 16.1  HCT 34.9* 44.2  PLT 269 321   BMET  Basename 08/28/11 0435 08/27/11 1136  NA 133* 132*  K 3.7 3.7  CL 99 92*  CO2 26 27  GLUCOSE 142* 132*  BUN 18 25*  CREATININE 1.10 1.30  CALCIUM 8.0* 9.7   PT/INR No results found for this basename: LABPROT:2,INR:2 in the last 72 hours Comprehensive Metabolic Panel:    Component Value Date/Time   NA 133* 08/28/2011 0435   K 3.7 08/28/2011 0435   CL 99 08/28/2011 0435   CO2 26 08/28/2011 0435   BUN 18 08/28/2011 0435   CREATININE 1.10 08/28/2011 0435   CREATININE 1.38* 08/26/2011 1222   GLUCOSE 142* 08/28/2011 0435   CALCIUM 8.0* 08/28/2011 0435   AST 35 08/27/2011 1136   ALT 49 08/27/2011 1136   ALKPHOS 127* 08/27/2011 1136   BILITOT 0.5 08/27/2011 1136   PROT 7.5 08/27/2011 1136   ALBUMIN 2.6* 08/27/2011 1136     Studies/Results: Ct Abdomen Pelvis W Contrast  08/27/2011  *RADIOLOGY REPORT*  Clinical Data: Abdominal pain, nausea/vomiting/diarrhea  CT ABDOMEN AND PELVIS WITH CONTRAST  Technique:  Multidetector CT imaging of the abdomen and pelvis was performed following the standard protocol during bolus administration of intravenous contrast.  Contrast: OMNIPAQUE IOHEXOL 300 MG/ML  SOLN  Comparison: None.  Findings: Lung  bases are clear.  Liver, spleen, pancreas, and adrenal glands are within normal limits.  Gallbladder is underdistended.  No intrahepatic or extrahepatic ductal dilatation.  Kidneys are notable for a tiny probable right upper pole cyst.  No hydronephrosis.  The vast majority of the small bowel and colon are diffusely abnormal.  There are multiple loops of mildly dilated proximal small bowel in the left upper abdomen, likely reflecting ileus. There is mucosal edema involving the mid/distal small bowel diffusely.  There is pancolonic wall thickening.  There are numerous scattered colonic diverticuli.  Scattered foci of free air, most prominently just superior to the sigmoid colon (series 2/images 59 and 62), suggesting sigmoid diverticulitis or colitis with perforation.  There is a 2.1 x 1.6 cm fluid collection in the left pelvis (series 2/image 66), worrisome for early pericolonic abscess.  Scattered inflammatory changes with mucosal edema and small volume abdominopelvic ascites.  No evidence of abdominal aortic aneurysm.  The celiac artery, SMA, and IMA are patent.  No suspicious abdominopelvic lymphadenopathy.  Prostatomegaly, measuring 6.1 cm in transverse dimension.  Bladder is within normal limits.  Mild degenerative changes of the visualized thoracolumbar spine.  IMPRESSION: Findings most suggestive of perforated sigmoid colitis or diverticulitis.  Additional abnormal loops of small bowel and colon with mucosal thickening/edema, as described above.  Possible 2.1 x 1.6 cm early pericolonic abscess.  These results were called  by telephone on 08/27/2011 at 1425 hours to Dr. Anitra Lauth, who verbally acknowledged these results.  Original Report Authenticated By: Charline Bills, M.D.    Anti-infectives: Anti-infectives     Start     Dose/Rate Route Frequency Ordered Stop   08/27/11 1600   ertapenem (INVANZ) 1 g in sodium chloride 0.9 % 50 mL IVPB        1 g 100 mL/hr over 30 Minutes Intravenous Every 24 hours  08/27/11 1534     08/27/11 1500   piperacillin-tazobactam (ZOSYN) IVPB 3.375 g  Status:  Discontinued        3.375 g 12.5 mL/hr over 240 Minutes Intravenous  Once 08/27/11 1436 08/27/11 1553          Assessment Active Problems:  Diverticulitis of colon with microperforation-significantly improved clinically  C. Diff pending.   LOS: 1 day   Plan: Continue bowel rest and IV abxs.  Ambulate.  Recheck lab tomorrow.   [name] J 08/28/2011

## 2011-08-28 NOTE — Progress Notes (Signed)
CRITICAL VALUE ALERT  Critical value received: C-Difficile Positive  Date of notification:  08/28/11  Time of notification:  1255  Critical value read back: yes  Nurse who received alert:  G. Nashayla Telleria, RN  MD notified (1st page):  Gerkin, MD  Time of first page:  1257  MD notified (2nd page):  Time of second page:  Responding MD:  Gerrit Friends, MD  Time MD responded:  [phone]

## 2011-08-29 ENCOUNTER — Encounter: Payer: Self-pay | Admitting: Family Medicine

## 2011-08-29 ENCOUNTER — Encounter: Payer: Self-pay | Admitting: Medical

## 2011-08-29 LAB — CBC
Hemoglobin: 11.8 g/dL — ABNORMAL LOW (ref 13.0–17.0)
MCH: 31.4 pg (ref 26.0–34.0)
MCV: 91 fL (ref 78.0–100.0)
Platelets: 316 10*3/uL (ref 150–400)
RBC: 3.76 MIL/uL — ABNORMAL LOW (ref 4.22–5.81)
WBC: 13.9 10*3/uL — ABNORMAL HIGH (ref 4.0–10.5)

## 2011-08-29 LAB — BASIC METABOLIC PANEL
CO2: 24 mEq/L (ref 19–32)
Chloride: 102 mEq/L (ref 96–112)
Glucose, Bld: 127 mg/dL — ABNORMAL HIGH (ref 70–99)
Potassium: 3.3 mEq/L — ABNORMAL LOW (ref 3.5–5.1)
Sodium: 134 mEq/L — ABNORMAL LOW (ref 135–145)

## 2011-08-29 MED ORDER — BOOST / RESOURCE BREEZE PO LIQD: 1.0000 | Freq: Two times a day (BID) | ORAL | Status: DC

## 2011-08-29 MED ORDER — BOOST / RESOURCE BREEZE PO LIQD
1.0000 | Freq: Every day | ORAL | Status: DC
  Administered 2011-08-29 – 2011-09-05 (×4): 1 via ORAL

## 2011-08-29 NOTE — Progress Notes (Signed)
Patient ID: [name], male   DOB: July 13, 1955, 56 y.o.   MRN: [id number]    Subjective: Pt feels well except for having multiple stools.  He has already had around 5 today.  Objective: Vital signs in last 24 hours: Temp:  [98.1 F (36.7 C)-99.5 F (37.5 C)] 98.7 F (37.1 C) (08/12 0609) Pulse Rate:  [100-120] 115  (08/12 0609) Resp:  [16-20] 16  (08/12 0609) BP: (107-120)/(60-78) 120/78 mmHg (08/12 0609) SpO2:  [96 %-97 %] 96 % (08/12 0609) Last BM Date: 08/28/11  Intake/Output from previous day: 08/11 0701 - 08/12 0700 In: 3843.4 [P.O.:510; I.V.:3320.4; IV Piggyback:10] Out: 803 [Urine:800; Stool:3] Intake/Output this shift: Total I/O In: 0  Out: 200 [Urine:200]  PE: Abd: soft, only mildly tender in suprapubic region.  Otherwise, NT, ND, +BS Ht: reg Lungs: CTAB  Lab Results:   Basename 08/29/11 0407 08/28/11 0435  WBC 13.9* 13.0*  HGB 11.8* 11.9*  HCT 34.2* 34.9*  PLT 316 269   BMET  Basename 08/29/11 0407 08/28/11 0435  NA 134* 133*  K 3.3* 3.7  CL 102 99  CO2 24 26  GLUCOSE 127* 142*  BUN 16 18  CREATININE 1.07 1.10  CALCIUM 7.9* 8.0*   PT/INR No results found for this basename: LABPROT:2,INR:2 in the last 72 hours CMP     Component Value Date/Time   NA 134* 08/29/2011 0407   K 3.3* 08/29/2011 0407   CL 102 08/29/2011 0407   CO2 24 08/29/2011 0407   GLUCOSE 127* 08/29/2011 0407   BUN 16 08/29/2011 0407   CREATININE 1.07 08/29/2011 0407   CREATININE 1.38* 08/26/2011 1222   CALCIUM 7.9* 08/29/2011 0407   PROT 7.5 08/27/2011 1136   ALBUMIN 2.6* 08/27/2011 1136   AST 35 08/27/2011 1136   ALT 49 08/27/2011 1136   ALKPHOS 127* 08/27/2011 1136   BILITOT 0.5 08/27/2011 1136   GFRNONAA 75* 08/29/2011 0407   GFRAA 87* 08/29/2011 0407   Lipase     Component Value Date/Time   LIPASE 59 08/27/2011 1136       Studies/Results: Ct Abdomen Pelvis W Contrast  08/27/2011  *RADIOLOGY REPORT*  Clinical Data: Abdominal pain, nausea/vomiting/diarrhea  CT ABDOMEN AND  PELVIS WITH CONTRAST  Technique:  Multidetector CT imaging of the abdomen and pelvis was performed following the standard protocol during bolus administration of intravenous contrast.  Contrast: OMNIPAQUE IOHEXOL 300 MG/ML  SOLN  Comparison: None.  Findings: Lung bases are clear.  Liver, spleen, pancreas, and adrenal glands are within normal limits.  Gallbladder is underdistended.  No intrahepatic or extrahepatic ductal dilatation.  Kidneys are notable for a tiny probable right upper pole cyst.  No hydronephrosis.  The vast majority of the small bowel and colon are diffusely abnormal.  There are multiple loops of mildly dilated proximal small bowel in the left upper abdomen, likely reflecting ileus. There is mucosal edema involving the mid/distal small bowel diffusely.  There is pancolonic wall thickening.  There are numerous scattered colonic diverticuli.  Scattered foci of free air, most prominently just superior to the sigmoid colon (series 2/images 59 and 62), suggesting sigmoid diverticulitis or colitis with perforation.  There is a 2.1 x 1.6 cm fluid collection in the left pelvis (series 2/image 66), worrisome for early pericolonic abscess.  Scattered inflammatory changes with mucosal edema and small volume abdominopelvic ascites.  No evidence of abdominal aortic aneurysm.  The celiac artery, SMA, and IMA are patent.  No suspicious abdominopelvic lymphadenopathy.  Prostatomegaly, measuring 6.1  cm in transverse dimension.  Bladder is within normal limits.  Mild degenerative changes of the visualized thoracolumbar spine.  IMPRESSION: Findings most suggestive of perforated sigmoid colitis or diverticulitis.  Additional abnormal loops of small bowel and colon with mucosal thickening/edema, as described above.  Possible 2.1 x 1.6 cm early pericolonic abscess.  These results were called by telephone on 08/27/2011 at 1425 hours to Dr. Anitra Lauth, who verbally acknowledged these results.  Original Report  Authenticated By: Charline Bills, M.D.    Anti-infectives: Anti-infectives     Start     Dose/Rate Route Frequency Ordered Stop   08/28/11 1400   metroNIDAZOLE (FLAGYL) tablet 250 mg        250 mg Oral 3 times per day 08/28/11 1318     08/27/11 1600   ertapenem (INVANZ) 1 g in sodium chloride 0.9 % 50 mL IVPB        1 g 100 mL/hr over 30 Minutes Intravenous Every 24 hours 08/27/11 1534     08/27/11 1500   piperacillin-tazobactam (ZOSYN) IVPB 3.375 g  Status:  Discontinued        3.375 g 12.5 mL/hr over 240 Minutes Intravenous  Once 08/27/11 1436 08/27/11 1553           Assessment/Plan  1. Diverticulitis with microperforation 2. C. Diff colitis  Plan: 1. Cont Invanz and Flagyl 2. Will allow clear liquids 3. Recheck labs in the morning.   LOS: 2 days    Tanya Crothers E 08/29/2011

## 2011-08-29 NOTE — Progress Notes (Signed)
INITIAL ADULT NUTRITION ASSESSMENT Date: 08/29/2011   Time: 1:40 PM Reason for Assessment: Nutrition Risk for unintentional weight loss  ASSESSMENT: Male 56 y.o.  Dx: Lower abdominal pain with nausea and vomiting   Hx:  Past Medical History  Diagnosis Date  . Coronary heart disease   . Hypertension   . Heart murmur   . Hemorrhoids     Related Meds:  Scheduled Meds:   . ertapenem (INVANZ) IV  1 g Intravenous Q24H  . metoprolol  2.5 mg Intravenous Q6H  . metroNIDAZOLE  250 mg Oral Q8H  . sodium chloride  500 mL Intravenous Once  . DISCONTD: pantoprazole (PROTONIX) IV  40 mg Intravenous QHS   Continuous Infusions:   . dextrose 5 % and 0.9 % NaCl with KCl 20 mEq/L 125 mL/hr at 08/29/11 1158   PRN Meds:.morphine injection, ondansetron   Ht: 6\' 2"  (188 cm)  Wt: 165 lb 9.1 oz (75.1 kg)  Ideal Wt: 86.36 kg % Ideal Wt: 87% Wt Readings from Last 10 Encounters:  08/27/11 165 lb 9.1 oz (75.1 kg)  08/26/11 165 lb (74.844 kg)  08/24/11 168 lb (76.204 kg)  08/22/11 173 lb (78.472 kg)  06/09/11 176 lb 3.2 oz (79.924 kg)  01/04/11 172 lb 6.4 oz (78.2 kg)  *Unintentional weight loss of 8 lb over 7 days, 4.6% from baseline.   Usual Wt: 175 lb per patient 1.5 weeks ago.  % Usual Wt: 94%  Body mass index is 21.26 kg/(m^2). (WNL)  Food/Nutrition Related Hx: Patient reported he has had nausea and vomiting for the past week. He reported he has not been able to take PO until today. He was eating a popsickle at time of RD visit. He agreed to try [employer] supplement.   Labs:  CMP     Component Value Date/Time   NA 134* 08/29/2011 0407   K 3.3* 08/29/2011 0407   CL 102 08/29/2011 0407   CO2 24 08/29/2011 0407   GLUCOSE 127* 08/29/2011 0407   BUN 16 08/29/2011 0407   CREATININE 1.07 08/29/2011 0407   CREATININE 1.38* 08/26/2011 1222   CALCIUM 7.9* 08/29/2011 0407   PROT 7.5 08/27/2011 1136   ALBUMIN 2.6* 08/27/2011 1136   AST 35 08/27/2011 1136   ALT 49 08/27/2011 1136     ALKPHOS 127* 08/27/2011 1136   BILITOT 0.5 08/27/2011 1136   GFRNONAA 75* 08/29/2011 0407   GFRAA 87* 08/29/2011 0407    Intake/Output Summary (Last 24 hours) at 08/29/11 1348 Last data filed at 08/29/11 1191  Gross per 24 hour  Intake 2660.92 ml  Output    928 ml  Net 1732.92 ml     Diet Order: Clear Liquid  Supplements/Tube Feeding: none at this time.   IVF:    dextrose 5 % and 0.9 % NaCl with KCl 20 mEq/L Last Rate: 125 mL/hr at 08/29/11 1158    Estimated Nutritional Needs:   Kcal: 4782-9562 Protein: 90-112.5 grams  Fluid: 1 ml per kcal intake  NUTRITION DIAGNOSIS: -Inadequate oral intake (NI-2.1).  Status: Ongoing  RELATED TO: poor appetite, nausea and vomiting  AS EVIDENCE BY: pt with poor PO intake and weight loss of 7 lb over the past week.   MONITORING/EVALUATION(Goals): Diet advancements/ tolerance, PO intake, weights, labs 1. PO intake > 75% at meals and supplements.  2. Diet advancement as medically able with positive tolerance.   EDUCATION NEEDS: -No education needs identified at this time  INTERVENTION: 1. Will order patient resource breeze once  daily. Provides 250 kcal and 9 grams of protein daily.  2. RD to follow for nutrition plan of care.   Dietitian [phone]  DOCUMENTATION CODES Per approved criteria  -Not Applicable    Iven Finn [facility] 08/29/2011, 1:40 PM

## 2011-08-29 NOTE — Progress Notes (Signed)
He continues to feel better. He thinks his loose stools may have early slow down a little bit. His pain is clearly improved.  On exam his abdomen is soft with very minimal left lower quadrant tenderness. No rebound.  Lab data reviewed  Impression improving diverticulitis/C. Difficile colitis  Plan: See notes by KO,PA

## 2011-08-30 DIAGNOSIS — I10 Essential (primary) hypertension: Secondary | ICD-10-CM

## 2011-08-30 LAB — BASIC METABOLIC PANEL
BUN: 14 mg/dL (ref 6–23)
Creatinine, Ser: 0.99 mg/dL (ref 0.50–1.35)
GFR calc Af Amer: >90 mL/min (ref >90)
GFR calc non Af Amer: 89 mL/min — ABNORMAL LOW (ref >90)
Potassium: 3.4 mEq/L — ABNORMAL LOW (ref 3.5–5.1)

## 2011-08-30 LAB — CBC
HCT: 34.2 % — ABNORMAL LOW (ref 39.0–52.0)
MCHC: 34.2 g/dL (ref 30.0–36.0)
Platelets: 354 10*3/uL (ref 150–400)
RDW: 14.6 % (ref 11.5–15.5)

## 2011-08-30 MED ORDER — METOPROLOL SUCCINATE ER 50 MG PO TB24
50.0000 mg | ORAL_TABLET | Freq: Every day | ORAL | Status: DC
  Administered 2011-08-30 – 2011-09-07 (×9): 50 mg via ORAL
  Filled 2011-08-30 (×11): qty 1

## 2011-08-30 MED ORDER — SODIUM CHLORIDE 0.9 % IJ SOLN: 3.0000 mL | INTRAMUSCULAR | Status: DC | PRN

## 2011-08-30 NOTE — Progress Notes (Signed)
Clearly improved today, mild LLQ tender no rebound. Agree with plans to advance diet. May be ready to dc on Thursday

## 2011-08-30 NOTE — Progress Notes (Signed)
Patient ID: [name], male   DOB: 1955/03/21, 56 y.o.   MRN: [id number]    Subjective: Pt feels well.  No pain.  BMs are starting to slow down.  Objective: Vital signs in last 24 hours: Temp:  [98.7 F (37.1 C)-100.3 F (37.9 C)] 98.7 F (37.1 C) (08/13 0636) Pulse Rate:  [84-119] 96  (08/13 0636) Resp:  [14-16] 16  (08/13 0636) BP: (109-117)/(71-74) 109/73 mmHg (08/13 0636) SpO2:  [93 %-96 %] 95 % (08/13 0636) Last BM Date: 08/29/11  Intake/Output from previous day: 08/12 0701 - 08/13 0700 In: 1752.1 [P.O.:300; I.V.:1302.1; IV Piggyback:150] Out: 700 [Urine:700] Intake/Output this shift:    PE: Abd: soft, essentially NT, mild LLQ, ND, +BS Ext: left forearm with some edema secondary to IV infiltration  Lab Results:   Basename 08/30/11 0422 08/29/11 0407  WBC 13.3* 13.9*  HGB 11.7* 11.8*  HCT 34.2* 34.2*  PLT 354 316   BMET  Basename 08/30/11 0422 08/29/11 0407  NA 134* 134*  K 3.4* 3.3*  CL 104 102  CO2 22 24  GLUCOSE 119* 127*  BUN 14 16  CREATININE 0.99 1.07  CALCIUM 7.8* 7.9*   PT/INR No results found for this basename: LABPROT:2,INR:2 in the last 72 hours CMP     Component Value Date/Time   NA 134* 08/30/2011 0422   K 3.4* 08/30/2011 0422   CL 104 08/30/2011 0422   CO2 22 08/30/2011 0422   GLUCOSE 119* 08/30/2011 0422   BUN 14 08/30/2011 0422   CREATININE 0.99 08/30/2011 0422   CREATININE 1.38* 08/26/2011 1222   CALCIUM 7.8* 08/30/2011 0422   PROT 7.5 08/27/2011 1136   ALBUMIN 2.6* 08/27/2011 1136   AST 35 08/27/2011 1136   ALT 49 08/27/2011 1136   ALKPHOS 127* 08/27/2011 1136   BILITOT 0.5 08/27/2011 1136   GFRNONAA 89* 08/30/2011 0422   GFRAA >90 08/30/2011 0422   Lipase     Component Value Date/Time   LIPASE 59 08/27/2011 1136       Studies/Results: No results found.  Anti-infectives: Anti-infectives     Start     Dose/Rate Route Frequency Ordered Stop   08/28/11 1400   metroNIDAZOLE (FLAGYL) tablet 250 mg        250 mg Oral 3 times per  day 08/28/11 1318     08/27/11 1600   ertapenem (INVANZ) 1 g in sodium chloride 0.9 % 50 mL IVPB        1 g 100 mL/hr over 30 Minutes Intravenous Every 24 hours 08/27/11 1534     08/27/11 1500   piperacillin-tazobactam (ZOSYN) IVPB 3.375 g  Status:  Discontinued        3.375 g 12.5 mL/hr over 240 Minutes Intravenous  Once 08/27/11 1436 08/27/11 1553           Assessment/Plan  1. Diverticulitis with microperforation 2. C. Diff colitis 3. HTN  Plan: 1. Advance diet 2. Cont  abx therapy.  Already on PO flagyl.  Consider switch to oral Invanz equivalent 3. Restart home BP meds and stop IV meds, since we will saline lock his IV. 4. Repeat CT scan in next 1-2 days   LOS: 3 days    Sabrin Dunlevy E 08/30/2011

## 2011-08-31 ENCOUNTER — Inpatient Hospital Stay (HOSPITAL_COMMUNITY): Payer: Managed Care, Other (non HMO)

## 2011-08-31 ENCOUNTER — Encounter (HOSPITAL_COMMUNITY): Payer: Self-pay | Admitting: Radiology

## 2011-08-31 DIAGNOSIS — A0472 Enterocolitis due to Clostridium difficile, not specified as recurrent: Secondary | ICD-10-CM | POA: Diagnosis present

## 2011-08-31 LAB — STOOL CULTURE

## 2011-08-31 LAB — OVA AND PARASITE EXAMINATION

## 2011-08-31 MED ORDER — IOHEXOL 300 MG/ML  SOLN
100.0000 mL | Freq: Once | INTRAMUSCULAR | Status: AC | PRN
  Administered 2011-08-31: 100 mL via INTRAVENOUS

## 2011-08-31 MED ORDER — FENTANYL CITRATE 0.05 MG/ML IJ SOLN
INTRAMUSCULAR | Status: AC | PRN
  Administered 2011-08-31 (×2): 100 ug via INTRAVENOUS

## 2011-08-31 MED ORDER — MIDAZOLAM HCL 5 MG/5ML IJ SOLN
INTRAMUSCULAR | Status: AC | PRN
  Administered 2011-08-31: 2 mg via INTRAVENOUS

## 2011-08-31 NOTE — H&P (Signed)
Agree with above.  Multiple intra-abdominal fluid collections.  The largest is located in the LLQ and extends into the pelvis.  Several smaller collections appear to either contain mostly air and little fluid, or communicate with the large collection.  Will proceed with drainage of the dominant collection.  If additional collections become loculated and do not respond to IV abx, we may have to place additional drains.   Signed,  Sterling Big, MD Vascular & Interventional Radiologist [facility]

## 2011-08-31 NOTE — Progress Notes (Addendum)
Diverticulitis of colon with perforation  Subjective: Feels better, no BM since yesterday, pain much improved  Objective: Vital signs in last 24 hours: Temp:  [98.5 F (36.9 C)-99.9 F (37.7 C)] 98.5 F (36.9 C) (08/14 0520) Pulse Rate:  [103-108] 107  (08/14 0520) Resp:  [16] 16  (08/14 0520) BP: (103-107)/(70-72) 103/70 mmHg (08/14 0520) SpO2:  [97 %-98 %] 97 % (08/14 0520) Last BM Date: 08/30/11  Intake/Output from previous day: 08/13 0701 - 08/14 0700 In: 3127 [P.O.:1200; I.V.:1875; IV Piggyback:50] Out: 495 [Urine:495] Intake/Output this shift:    General appearance: alert, cooperative and no distress GI: Minimal distention persists, otherwise normal exam - not tender  Lab Results:  No results found for this or any previous visit (from the past 24 hour(s)).   Studies/Results Radiology     MEDS, Scheduled    . ertapenem [employer]) IV  1 g Intravenous Q24H  . feeding supplement  1 Container Oral QPC supper  . metoprolol succinate  50 mg Oral Daily  . metroNIDAZOLE  250 mg Oral Q8H     Assessment: Diverticulitis of colon with perforation Improving  Plan: Repeat CT/labs in AM May D/C tomorrow  LOS: 4 days    Currie Paris, MD, [facility], [place] [phone]   08/31/2011 8:52 AM   CT reviewed with radiologist and clearly worse, now with large abscess, air collection and prob small pelvic collection with some contrast. Despite the radiology findings, he continues to deny abd pain, and wants to be able to go home. His abd is still a bit distended, but soft without guarding or rebound.  I think he is going to need surgery sooner rather than later, but given his clinical appearance, we can attempt perc drain and see if he progresses. If he has to have surgery he will need ostomy and he is reluctant to consider surgery since he feels fairly well. However, I explained to him that he may well come to surgery this admission if he has any  decline in overall condition.   Will keep NPO and consider TNA tomorrow, depending on results of IR attempts to drain

## 2011-08-31 NOTE — H&P (Signed)
Chief Complaint: Diverticular abscess Referring Physician:Streck HPI: [name] is an 56 y.o. male who was admitted for diverticulitis with microperforation. He was followed with IV abx and was making progress. However, a repeat CT scan today shows marked change with a large 8x11cm abscess containing contrast and air suggestive of more significant perforated viscus. Dr. Jamey Ripa has reviewed CT with IR MD Archer Asa and has ordered CT guided drain placement. The pt feels ok, but hasn't been able to eat much, only had a few sips of soup earlier today. He is also positive for C.Diff  Past Medical History:  Past Medical History  Diagnosis Date  . Coronary heart disease   . Hypertension   . Heart murmur   . Hemorrhoids     Past Surgical History:  Past Surgical History  Procedure Date  . Hemorrhoid surgery   . Cardiac surgery 2006    mitral  valve repair    Family History:  Family History  Problem Relation Age of Onset  . Cancer Brother     pancreatic    Social History:  reports that he has never smoked. He does not have any smokeless tobacco history on file. He reports that he drinks about 7 ounces of alcohol per week. He reports that he does not use illicit drugs.  Allergies: No Known Allergies  Medications: Medications Prior to Admission  Medication Sig Dispense Refill  . aspirin 81 MG tablet Take 81 mg by mouth daily.        . ciprofloxacin (CIPRO) 500 MG tablet Take 500 mg by mouth 2 (two) times daily.      . metoprolol succinate (TOPROL-XL) 100 MG 24 hr tablet Take 50 mg by mouth daily. Take 1/2 with or immediately following a meal.      . metoprolol (TOPROL XL) 100 MG 24 hr tablet Take 0.5 tablets (50 mg total) by mouth daily.  30 tablet  11    Please HPI for pertinent positives, otherwise complete 10 system ROS negative.  Physical Exam: Blood pressure 119/80, pulse 106, temperature 98.8 F (37.1 C), temperature source Oral, resp. rate 16, height 6\' 2"  (1.88 m),  weight 165 lb 9.1 oz (75.1 kg), SpO2 95.00%. Body mass index is 21.26 kg/(m^2).   General Appearance:  Alert, cooperative, no distress, appears stated age  Head:  Normocephalic, without obvious abnormality, atraumatic  ENT: Unremarkable  Neck: Supple, symmetrical, trachea midline, no adenopathy, thyroid: not enlarged, symmetric, no tenderness/mass/nodules  Lungs:   Clear to auscultation bilaterally, no w/r/r, respirations unlabored without use of accessory muscles.  Chest Wall:  No tenderness or deformity  Heart:  Mildly tachy rate and reg rhythm, S1, S2 normal, no murmur, rub or gallop. Carotids 2+ without bruit.  Abdomen:   Soft, non-distended. Diffuse tenderness but no peritoneal signs.  Neurologic: Normal affect, no gross deficits.   Results for orders placed during the hospital encounter of 08/27/11 (from the past 48 hour(s))  CBC     Status: Abnormal   Collection Time   08/30/11  4:22 AM      Component Value Range Comment   WBC 13.3 (*) 4.0 - 10.5 K/uL    RBC 3.80 (*) 4.22 - 5.81 MIL/uL    Hemoglobin 11.7 (*) 13.0 - 17.0 g/dL    HCT 16.1 (*) 09.6 - 52.0 %    MCV 90.0  78.0 - 100.0 fL    MCH 30.8  26.0 - 34.0 pg    MCHC 34.2  30.0 - 36.0 g/dL  RDW 14.6  11.5 - 15.5 %    Platelets 354  150 - 400 K/uL   BASIC METABOLIC PANEL     Status: Abnormal   Collection Time   08/30/11  4:22 AM      Component Value Range Comment   Sodium 134 (*) 135 - 145 mEq/L    Potassium 3.4 (*) 3.5 - 5.1 mEq/L    Chloride 104  96 - 112 mEq/L    CO2 22  19 - 32 mEq/L    Glucose, Bld 119 (*) 70 - 99 mg/dL    BUN 14  6 - 23 mg/dL    Creatinine, Ser 4.54  0.50 - 1.35 mg/dL    Calcium 7.8 (*) 8.4 - 10.5 mg/dL    GFR calc non Af Amer 89 (*) >90 mL/min    GFR calc Af Amer >90  >90 mL/min    Ct Abdomen Pelvis W Contrast  08/31/2011  *RADIOLOGY REPORT*  Clinical Data: Diverticulitis.  CT ABDOMEN AND PELVIS WITH CONTRAST  Technique:  Multidetector CT imaging of the abdomen and pelvis was performed  following the standard protocol during bolus administration of intravenous contrast.  Contrast: OMNIPAQUE IOHEXOL 300 MG/ML  SOLN  Comparison: 08/27/2011.  Findings: Lung bases show new tiny bilateral pleural effusions, right greater than left.  Heart size normal.  No pericardial effusion.  Small hiatal hernia.  There may be thickening of the distal esophageal wall, which can be seen with gastroesophageal reflux disease.  A 9 mm low attenuation lesion in the right hepatic lobe is unchanged, as is a mixed attenuation lesion in the peripheral right hepatic lobe, measuring 12 mm.  The latter likely represents a hemangioma.  Gallbladder and adrenal glands are unremarkable. Early excretion of contrast is seen in the kidneys.  Spleen, pancreas and stomach are otherwise unremarkable.  Fairly diffuse prominence of the small bowel wall with extensive mesenteric and omental edema.  There are areas of free fluid and locules of air.  High attenuation is seen along the peritoneal border of the right paracolic gutter.  An air fluid collection in the central small bowel mesentery measures 5.0 x 5.6 cm (image 46) and is new.  There is a large collection of fluid and air extending from the left anatomic pelvis into the lower central anatomic pelvis, measuring approximately 8.1 x 11.8 cm, new from 08/27/2011. There are locules of air in the central anatomic pelvis as well. Extensive thickening of the sigmoid colon is seen with extraluminal contrast (example images 69 and 75). Prostate is enlarged.  There is sclerosis involving the left eight posterolateral rib (image 6), which may be post-traumatic in etiology.  IMPRESSION:  1. Progression of perforated sigmoid diverticulitis with extraluminal contrast and air, as well as formation of two large abscesses in the anatomic pelvis. These results were called by telephone on 08/31/2011 at 1448 hours to Dr. Jamey Ripa, who verbally acknowledged these results. 2.  Extensive inflammatory  changes and fluid within the peritoneum. Hyper attenuation along the peritoneal border is indicative of peritonitis. 3.  Apparent thickening of the small bowel wall is likely secondary to adjacent inflammatory changes. 4.  New tiny bilateral pleural effusions.  Original Report Authenticated By: Reyes Ivan, M.D.    Assessment/Plan Diverticulitis with perforation and large abscess Discussed perc drainage by CT guidance. Discussed risks and complications of procedure. Discussed possibility of fistula or failure of treatment with drain, which would require surgical intervention. Labs reviewed. Consent signed in chart.  Brayton El  PA-C 08/31/2011, 3:46 PM

## 2011-08-31 NOTE — Procedures (Signed)
Interventional Radiology Procedure Note  Procedure: CT guided drainage of LLQ abscess.  A multi sidehole 46F biliary drain was placed.  Nearly 300 mL yellow-tan, foul smelling fluid aspirated.  A sample was sent for Cx. Complications: None Recommendations: - Maintain to bulb suction for now - Follow Cx results, adjust Abx as needed - Pt may ultimately need additional drains if other collects do not communicate with this largest collection - We will follow with you  Signed,  Sterling Big, MD Vascular & Interventional Radiologist [facility]

## 2011-09-01 DIAGNOSIS — A0472 Enterocolitis due to Clostridium difficile, not specified as recurrent: Secondary | ICD-10-CM

## 2011-09-01 LAB — CBC
Hemoglobin: 11.3 g/dL — ABNORMAL LOW (ref 13.0–17.0)
MCHC: 33.7 g/dL (ref 30.0–36.0)
RDW: 14.4 % (ref 11.5–15.5)

## 2011-09-01 MED ORDER — METRONIDAZOLE 500 MG PO TABS
500.0000 mg | ORAL_TABLET | Freq: Three times a day (TID) | ORAL | Status: DC
  Administered 2011-09-01 – 2011-09-08 (×21): 500 mg via ORAL
  Filled 2011-09-01 (×28): qty 1

## 2011-09-01 NOTE — Progress Notes (Signed)
Subjective: Pt states he feels much better since pelvic drainage procedure yesterday.  Objective: Vital signs in last 24 hours: Temp:  [97.9 F (36.6 C)-98.9 F (37.2 C)] 97.9 F (36.6 C) (08/15 0706) Pulse Rate:  [100-111] 111  (08/15 1256) Resp:  [16-29] 20  (08/15 0706) BP: (100-119)/(63-80) 112/71 mmHg (08/15 1256) SpO2:  [94 %-98 %] 97 % (08/15 0706) Last BM Date: 09/01/11 (1 this morning)  Intake/Output from previous day: 08/14 0701 - 08/15 0700 In: 110 [IV Piggyback:110] Out: 740 [Urine:325; Drains:415] Intake/Output this shift:    LLQ drain intact, insertion site ok, mildly tender, output 415 cc's feculent- like fluid today, cx's pend  Lab Results:   Basename 09/01/11 0430 08/30/11 0422  WBC 10.8* 13.3*  HGB 11.3* 11.7*  HCT 33.5* 34.2*  PLT 353 354   BMET  Basename 08/30/11 0422  NA 134*  K 3.4*  CL 104  CO2 22  GLUCOSE 119*  BUN 14  CREATININE 0.99  CALCIUM 7.8*   PT/INR No results found for this basename: LABPROT:2,INR:2 in the last 72 hours ABG No results found for this basename: PHART:2,PCO2:2,PO2:2,HCO3:2 in the last 72 hours Results for orders placed during the hospital encounter of 08/27/11  CLOSTRIDIUM DIFFICILE BY PCR     Status: Abnormal   Collection Time   08/27/11  8:50 PM      Component Value Range Status Comment   C difficile by pcr POSITIVE (*) NEGATIVE Final   STOOL CULTURE     Status: Normal   Collection Time   08/27/11  8:50 PM      Component Value Range Status Comment   Specimen Description STOOL   Final    Special Requests NONE   Final    Culture     Final    Value: NO SALMONELLA, SHIGELLA, CAMPYLOBACTER, YERSINIA, OR E.COLI 0157:H7 ISOLATED     Note: REDUCED NORMAL FLORA PRESENT   Report Status 08/31/2011 FINAL   Final   OVA AND PARASITE EXAMINATION     Status: Normal   Collection Time   08/27/11  8:50 PM      Component Value Range Status Comment   Specimen Description STOOL   Final    Special Requests NONE   Final      Ova and parasites NO OVA OR PARASITES SEEN   Final    Report Status 08/31/2011 FINAL   Final   CULTURE, ROUTINE-ABSCESS     Status: Normal (Preliminary result)   Collection Time   08/31/11  5:09 PM      Component Value Range Status Comment   Specimen Description PERITONEAL CAVITY   Final    Special Requests Normal   Final    Gram Stain     Final    Value: ABUNDANT WBC PRESENT,BOTH PMN AND MONONUCLEAR     NO SQUAMOUS EPITHELIAL CELLS SEEN     NO ORGANISMS SEEN   Culture NO GROWTH   Final    Report Status PENDING   Incomplete   ANAEROBIC CULTURE     Status: Normal (Preliminary result)   Collection Time   08/31/11  5:09 PM      Component Value Range Status Comment   Specimen Description ABSCESS   Final    Special Requests Normal   Final    Gram Stain     Final    Value: ABUNDANT WBC PRESENT,BOTH PMN AND MONONUCLEAR     NO SQUAMOUS EPITHELIAL CELLS SEEN     NO ORGANISMS SEEN  Culture     Final    Value: NO ANAEROBES ISOLATED; CULTURE IN PROGRESS FOR 5 DAYS   Report Status PENDING   Incomplete     Studies/Results: Ct Guided Abscess Drain  08/31/2011  *RADIOLOGY REPORT*  CT GUIDED ABSCESS DRAINAGE  Date: 08/31/2011  Clinical History: 56 year old male with diverticulitis and C difficile colitis now with intra-abdominal abscess  Procedures Performed: 1. CT guided abscess drainage and placement of a 12-French biliary drain into the left lower quadrant fluid collection  Interventional Radiologist:  Sterling Big, MD  Sedation: Moderate (conscious) sedation was used.  2 mg Versed, 200 mcg Fentanyl were administered intravenously.  The patient's vital signs were monitored continuously by radiology nursing throughout the procedure.  Sedation Time: 25 minutes  PROCEDURE/FINDINGS:   Informed consent was obtained from the patient following explanation of the procedure, risks, benefits and alternatives. The patient understands, agrees and consents for the procedure. All questions were  addressed. A time out was performed.  Maximal barrier sterile technique utilized including caps, mask, sterile gowns, sterile gloves, large sterile drape, hand hygiene, and betadine skin prep.  A planning axial CT scan was performed.  The fluid collection in the left lower quadrant was identified.  Appropriate skin site was selected and marked.  Local anesthesia was achieved with infiltration of 1% lidocaine.  Using CT guidance, an 18 gauge needle was advanced into the fluid collection and Amplatz wire advanced through the needle.  The tract was then serially dilated, and a 12-French biliary drainage catheter was placed.  Biliary drain was selected for the additional side holes and the relatively large size of the abscess collection.  Approximately 295 ml of opaque yellow, foul-smelling fluid was successfully aspirated. Sample was sent to the lab for culture.  The drainage catheter was then secured in place of the 2-0 Prolene suture and connected to bulb suction.  The patient tolerated the procedure well.  There was no immediate complication and the patient was returned to his room in stable condition.  IMPRESSION:  Technically successful CT guided placement of 12-French drainage catheter into the left lower quadrant abscess.  Signed,  Sterling Big, MD Vascular & Interventional Radiologist [facility]  Original Report Authenticated By: Threasa Beards Abdomen Pelvis W Contrast  08/31/2011  *RADIOLOGY REPORT*  Clinical Data: Diverticulitis.  CT ABDOMEN AND PELVIS WITH CONTRAST  Technique:  Multidetector CT imaging of the abdomen and pelvis was performed following the standard protocol during bolus administration of intravenous contrast.  Contrast: OMNIPAQUE IOHEXOL 300 MG/ML  SOLN  Comparison: 08/27/2011.  Findings: Lung bases show new tiny bilateral pleural effusions, right greater than left.  Heart size normal.  No pericardial effusion.  Small hiatal hernia.  There may be thickening of the  distal esophageal wall, which can be seen with gastroesophageal reflux disease.  A 9 mm low attenuation lesion in the right hepatic lobe is unchanged, as is a mixed attenuation lesion in the peripheral right hepatic lobe, measuring 12 mm.  The latter likely represents a hemangioma.  Gallbladder and adrenal glands are unremarkable. Early excretion of contrast is seen in the kidneys.  Spleen, pancreas and stomach are otherwise unremarkable.  Fairly diffuse prominence of the small bowel wall with extensive mesenteric and omental edema.  There are areas of free fluid and locules of air.  High attenuation is seen along the peritoneal border of the right paracolic gutter.  An air fluid collection in the central small bowel mesentery measures 5.0 x  5.6 cm (image 46) and is new.  There is a large collection of fluid and air extending from the left anatomic pelvis into the lower central anatomic pelvis, measuring approximately 8.1 x 11.8 cm, new from 08/27/2011. There are locules of air in the central anatomic pelvis as well. Extensive thickening of the sigmoid colon is seen with extraluminal contrast (example images 69 and 75). Prostate is enlarged.  There is sclerosis involving the left eight posterolateral rib (image 6), which may be post-traumatic in etiology.  IMPRESSION:  1. Progression of perforated sigmoid diverticulitis with extraluminal contrast and air, as well as formation of two large abscesses in the anatomic pelvis. These results were called by telephone on 08/31/2011 at 1448 hours to Dr. Jamey Ripa, who verbally acknowledged these results. 2.  Extensive inflammatory changes and fluid within the peritoneum. Hyper attenuation along the peritoneal border is indicative of peritonitis. 3.  Apparent thickening of the small bowel wall is likely secondary to adjacent inflammatory changes. 4.  New tiny bilateral pleural effusions.  Original Report Authenticated By: Reyes Ivan, M.D.     Anti-infectives: Anti-infectives     Start     Dose/Rate Route Frequency Ordered Stop   09/01/11 1400   metroNIDAZOLE (FLAGYL) tablet 500 mg        500 mg Oral 3 times per day 09/01/11 0749     08/28/11 1400   metroNIDAZOLE (FLAGYL) tablet 250 mg  Status:  Discontinued        250 mg Oral 3 times per day 08/28/11 1318 09/01/11 0749   08/27/11 1600   ertapenem (INVANZ) 1 g in sodium chloride 0.9 % 50 mL IVPB        1 g 100 mL/hr over 30 Minutes Intravenous Every 24 hours 08/27/11 1534     08/27/11 1500   piperacillin-tazobactam (ZOSYN) IVPB 3.375 g  Status:  Discontinued        3.375 g 12.5 mL/hr over 240 Minutes Intravenous  Once 08/27/11 1436 08/27/11 1553          Assessment/Plan: S/p LLQ diverticular abscess drainage 8/14; check final cx's, monitor labs, cont NS drain flushes ; per [facility] note f/u CT tent sched for 8/16 to reassess adequacy of drainage   LOS: 5 days    ALLRED,D [facility] 09/01/2011

## 2011-09-01 NOTE — Plan of Care (Signed)
Problem: Discharge Progression Outcomes Goal: Activity appropriate for discharge plan Outcome: Completed/Met Date Met:  09/01/11 Independent

## 2011-09-01 NOTE — Progress Notes (Signed)
Feels much better today. Abd soft and benign still. WBC coming down. So far purulent material from drain, but suspect will become feculent soon. Will repeat CT tomorrow to be sure abscess completely drained and that no other areas need drainage.  Will continue NPO today. If decide not to feed him tomorrow will need TNA.  He knows I think he is going to come to surgery

## 2011-09-01 NOTE — Progress Notes (Signed)
Patient ID: [name], male   DOB: Apr 02, 1955, 56 y.o.   MRN: [id number]    Subjective: Pt feels much better today after drain.  He states he has significantly less pressure in his pelvis.  Objective: Vital signs in last 24 hours: Temp:  [97.9 F (36.6 C)-98.9 F (37.2 C)] 97.9 F (36.6 C) (08/15 0706) Pulse Rate:  [100-117] 110  (08/15 0706) Resp:  [16-29] 20  (08/15 0706) BP: (100-122)/(63-80) 115/77 mmHg (08/15 0706) SpO2:  [94 %-98 %] 97 % (08/15 0706) Last BM Date: 08/31/11  Intake/Output from previous day: 08/14 0701 - 08/15 0700 In: 110 [IV Piggyback:110] Out: 740 [Urine:325; Drains:415] Intake/Output this shift:    PE: Abd: soft, essentially NT, ND, +BS, JP with bloody, cloudy output Heart: tachy Lungs: CTAB  Lab Results:   Basename 09/01/11 0430 08/30/11 0422  WBC 10.8* 13.3*  HGB 11.3* 11.7*  HCT 33.5* 34.2*  PLT 353 354   BMET  Basename 08/30/11 0422  NA 134*  K 3.4*  CL 104  CO2 22  GLUCOSE 119*  BUN 14  CREATININE 0.99  CALCIUM 7.8*   PT/INR No results found for this basename: LABPROT:2,INR:2 in the last 72 hours CMP     Component Value Date/Time   NA 134* 08/30/2011 0422   K 3.4* 08/30/2011 0422   CL 104 08/30/2011 0422   CO2 22 08/30/2011 0422   GLUCOSE 119* 08/30/2011 0422   BUN 14 08/30/2011 0422   CREATININE 0.99 08/30/2011 0422   CREATININE 1.38* 08/26/2011 1222   CALCIUM 7.8* 08/30/2011 0422   PROT 7.5 08/27/2011 1136   ALBUMIN 2.6* 08/27/2011 1136   AST 35 08/27/2011 1136   ALT 49 08/27/2011 1136   ALKPHOS 127* 08/27/2011 1136   BILITOT 0.5 08/27/2011 1136   GFRNONAA 89* 08/30/2011 0422   GFRAA >90 08/30/2011 0422   Lipase     Component Value Date/Time   LIPASE 59 08/27/2011 1136       Studies/Results: Ct Guided Abscess Drain  08/31/2011  *RADIOLOGY REPORT*  CT GUIDED ABSCESS DRAINAGE  Date: 08/31/2011  Clinical History: 56 year old male with diverticulitis and C difficile colitis now with intra-abdominal abscess  Procedures  Performed: 1. CT guided abscess drainage and placement of a 12-French biliary drain into the left lower quadrant fluid collection  Interventional Radiologist:  Sterling Big, MD  Sedation: Moderate (conscious) sedation was used.  2 mg Versed, 200 mcg Fentanyl were administered intravenously.  The patient's vital signs were monitored continuously by radiology nursing throughout the procedure.  Sedation Time: 25 minutes  PROCEDURE/FINDINGS:   Informed consent was obtained from the patient following explanation of the procedure, risks, benefits and alternatives. The patient understands, agrees and consents for the procedure. All questions were addressed. A time out was performed.  Maximal barrier sterile technique utilized including caps, mask, sterile gowns, sterile gloves, large sterile drape, hand hygiene, and betadine skin prep.  A planning axial CT scan was performed.  The fluid collection in the left lower quadrant was identified.  Appropriate skin site was selected and marked.  Local anesthesia was achieved with infiltration of 1% lidocaine.  Using CT guidance, an 18 gauge needle was advanced into the fluid collection and Amplatz wire advanced through the needle.  The tract was then serially dilated, and a 12-French biliary drainage catheter was placed.  Biliary drain was selected for the additional side holes and the relatively large size of the abscess collection.  Approximately 295 ml of opaque yellow, foul-smelling fluid  was successfully aspirated. Sample was sent to the lab for culture.  The drainage catheter was then secured in place of the 2-0 Prolene suture and connected to bulb suction.  The patient tolerated the procedure well.  There was no immediate complication and the patient was returned to his room in stable condition.  IMPRESSION:  Technically successful CT guided placement of 12-French drainage catheter into the left lower quadrant abscess.  Signed,  Sterling Big, MD Vascular &  Interventional Radiologist [facility] Radiology  Original Report Authenticated By: Threasa Beards Abdomen Pelvis W Contrast  08/31/2011  *RADIOLOGY REPORT*  Clinical Data: Diverticulitis.  CT ABDOMEN AND PELVIS WITH CONTRAST  Technique:  Multidetector CT imaging of the abdomen and pelvis was performed following the standard protocol during bolus administration of intravenous contrast.  Contrast: OMNIPAQUE IOHEXOL 300 MG/ML  SOLN  Comparison: 08/27/2011.  Findings: Lung bases show new tiny bilateral pleural effusions, right greater than left.  Heart size normal.  No pericardial effusion.  Small hiatal hernia.  There may be thickening of the distal esophageal wall, which can be seen with gastroesophageal reflux disease.  A 9 mm low attenuation lesion in the right hepatic lobe is unchanged, as is a mixed attenuation lesion in the peripheral right hepatic lobe, measuring 12 mm.  The latter likely represents a hemangioma.  Gallbladder and adrenal glands are unremarkable. Early excretion of contrast is seen in the kidneys.  Spleen, pancreas and stomach are otherwise unremarkable.  Fairly diffuse prominence of the small bowel wall with extensive mesenteric and omental edema.  There are areas of free fluid and locules of air.  High attenuation is seen along the peritoneal border of the right paracolic gutter.  An air fluid collection in the central small bowel mesentery measures 5.0 x 5.6 cm (image 46) and is new.  There is a large collection of fluid and air extending from the left anatomic pelvis into the lower central anatomic pelvis, measuring approximately 8.1 x 11.8 cm, new from 08/27/2011. There are locules of air in the central anatomic pelvis as well. Extensive thickening of the sigmoid colon is seen with extraluminal contrast (example images 69 and 75). Prostate is enlarged.  There is sclerosis involving the left eight posterolateral rib (image 6), which may be post-traumatic in etiology.  IMPRESSION:  1.  Progression of perforated sigmoid diverticulitis with extraluminal contrast and air, as well as formation of two large abscesses in the anatomic pelvis. These results were called by telephone on 08/31/2011 at 1448 hours to Dr. Jamey Ripa, who verbally acknowledged these results. 2.  Extensive inflammatory changes and fluid within the peritoneum. Hyper attenuation along the peritoneal border is indicative of peritonitis. 3.  Apparent thickening of the small bowel wall is likely secondary to adjacent inflammatory changes. 4.  New tiny bilateral pleural effusions.  Original Report Authenticated By: Reyes Ivan, M.D.    Anti-infectives: Anti-infectives     Start     Dose/Rate Route Frequency Ordered Stop   09/01/11 1400   metroNIDAZOLE (FLAGYL) tablet 500 mg        500 mg Oral 3 times per day 09/01/11 0749     08/28/11 1400   metroNIDAZOLE (FLAGYL) tablet 250 mg  Status:  Discontinued        250 mg Oral 3 times per day 08/28/11 1318 09/01/11 0749   08/27/11 1600   ertapenem (INVANZ) 1 g in sodium chloride 0.9 % 50 mL IVPB        1  g 100 mL/hr over 30 Minutes Intravenous Every 24 hours 08/27/11 1534     08/27/11 1500   piperacillin-tazobactam (ZOSYN) IVPB 3.375 g  Status:  Discontinued        3.375 g 12.5 mL/hr over 240 Minutes Intravenous  Once 08/27/11 1436 08/27/11 1553           Assessment/Plan  1. Diverticulitis with multiple intra-abd abscesses, s/p perc drain 2. c diff colitis  Plan: 1. Will cont with drain management for now.  The patient's CT scan definitely looks worse than he does clinically.  The patient would like to avoid surgery if possible at this time.  We will try conservative management, but if he does not make improvements or gets worse he may need surgical intervention. 2. Patient is on a beta-blocker and remains tachy, just FYI 3. Cont abx therapy for both disease processes. 4. Cont NPO for now   LOS: 5 days    Lilyian Quayle E 09/01/2011

## 2011-09-02 ENCOUNTER — Inpatient Hospital Stay (HOSPITAL_COMMUNITY): Payer: Managed Care, Other (non HMO)

## 2011-09-02 ENCOUNTER — Encounter (HOSPITAL_COMMUNITY): Payer: Self-pay | Admitting: Radiology

## 2011-09-02 LAB — CBC
Hemoglobin: 11.5 g/dL — ABNORMAL LOW (ref 13.0–17.0)
RBC: 3.75 MIL/uL — ABNORMAL LOW (ref 4.22–5.81)
WBC: 11.1 10*3/uL — ABNORMAL HIGH (ref 4.0–10.5)

## 2011-09-02 LAB — BASIC METABOLIC PANEL
GFR calc Af Amer: >90 mL/min (ref >90)
GFR calc non Af Amer: 90 mL/min — ABNORMAL LOW (ref >90)
Potassium: 4 mEq/L (ref 3.5–5.1)
Sodium: 133 mEq/L — ABNORMAL LOW (ref 135–145)

## 2011-09-02 MED ORDER — FENTANYL CITRATE 0.05 MG/ML IJ SOLN
INTRAMUSCULAR | Status: AC
  Filled 2011-09-02: qty 6

## 2011-09-02 MED ORDER — IOHEXOL 300 MG/ML  SOLN
100.0000 mL | Freq: Once | INTRAMUSCULAR | Status: AC | PRN
  Administered 2011-09-02: 100 mL via INTRAVENOUS

## 2011-09-02 MED ORDER — FENTANYL CITRATE 0.05 MG/ML IJ SOLN
INTRAMUSCULAR | Status: AC | PRN
  Administered 2011-09-02 (×2): 100 ug via INTRAVENOUS

## 2011-09-02 MED ORDER — MIDAZOLAM HCL 5 MG/5ML IJ SOLN
INTRAMUSCULAR | Status: AC | PRN
  Administered 2011-09-02 (×2): 2 mg via INTRAVENOUS

## 2011-09-02 MED ORDER — SODIUM CHLORIDE 0.9 % IJ SOLN
10.0000 mL | INTRAMUSCULAR | Status: DC | PRN
  Administered 2011-09-03: 10 mL
  Administered 2011-09-03: 20 mL
  Administered 2011-09-04 – 2011-09-15 (×7): 10 mL

## 2011-09-02 MED ORDER — MIDAZOLAM HCL 2 MG/2ML IJ SOLN
INTRAMUSCULAR | Status: AC
  Filled 2011-09-02: qty 6

## 2011-09-02 NOTE — Progress Notes (Signed)
Patient ID: [name], male   DOB: 04-06-1955, 56 y.o.   MRN: [id number]    Subjective: Pt feels well.  No complaints  Objective: Vital signs in last 24 hours: Temp:  [98.4 F (36.9 C)-99.3 F (37.4 C)] 98.4 F (36.9 C) (08/16 0600) Pulse Rate:  [99-111] 103  (08/16 0600) Resp:  [16-18] 18  (08/16 0600) BP: (103-115)/(71-74) 103/71 mmHg (08/16 0600) SpO2:  [95 %-97 %] 95 % (08/16 0600) Last BM Date: 09/01/11  Intake/Output from previous day: 08/15 0701 - 08/16 0700 In: -  Out: 470 [Urine:325; Drains:145] Intake/Output this shift:    PE: Abd: soft, NT except mildly around his drain, +BS, ND, drain with light brown output, possible stoolish output  Lab Results:   Basename 09/02/11 0407 09/01/11 0430  WBC 11.1* 10.8*  HGB 11.5* 11.3*  HCT 34.2* 33.5*  PLT 412* 353   BMET  Basename 09/02/11 0407  NA 133*  K 4.0  CL 99  CO2 25  GLUCOSE 91  BUN 19  CREATININE 0.98  CALCIUM 7.6*   PT/INR No results found for this basename: LABPROT:2,INR:2 in the last 72 hours CMP     Component Value Date/Time   NA 133* 09/02/2011 0407   K 4.0 09/02/2011 0407   CL 99 09/02/2011 0407   CO2 25 09/02/2011 0407   GLUCOSE 91 09/02/2011 0407   BUN 19 09/02/2011 0407   CREATININE 0.98 09/02/2011 0407   CREATININE 1.38* 08/26/2011 1222   CALCIUM 7.6* 09/02/2011 0407   PROT 7.5 08/27/2011 1136   ALBUMIN 2.6* 08/27/2011 1136   AST 35 08/27/2011 1136   ALT 49 08/27/2011 1136   ALKPHOS 127* 08/27/2011 1136   BILITOT 0.5 08/27/2011 1136   GFRNONAA 90* 09/02/2011 0407   GFRAA >90 09/02/2011 0407   Lipase     Component Value Date/Time   LIPASE 59 08/27/2011 1136       Studies/Results: Ct Guided Abscess Drain  08/31/2011  *RADIOLOGY REPORT*  CT GUIDED ABSCESS DRAINAGE  Date: 08/31/2011  Clinical History: 56 year old male with diverticulitis and C difficile colitis now with intra-abdominal abscess  Procedures Performed: 1. CT guided abscess drainage and placement of a 12-French biliary drain  into the left lower quadrant fluid collection  Interventional Radiologist:  Sterling Big, MD  Sedation: Moderate (conscious) sedation was used.  2 mg Versed, 200 mcg Fentanyl were administered intravenously.  The patient's vital signs were monitored continuously by radiology nursing throughout the procedure.  Sedation Time: 25 minutes  PROCEDURE/FINDINGS:   Informed consent was obtained from the patient following explanation of the procedure, risks, benefits and alternatives. The patient understands, agrees and consents for the procedure. All questions were addressed. A time out was performed.  Maximal barrier sterile technique utilized including caps, mask, sterile gowns, sterile gloves, large sterile drape, hand hygiene, and betadine skin prep.  A planning axial CT scan was performed.  The fluid collection in the left lower quadrant was identified.  Appropriate skin site was selected and marked.  Local anesthesia was achieved with infiltration of 1% lidocaine.  Using CT guidance, an 18 gauge needle was advanced into the fluid collection and Amplatz wire advanced through the needle.  The tract was then serially dilated, and a 12-French biliary drainage catheter was placed.  Biliary drain was selected for the additional side holes and the relatively large size of the abscess collection.  Approximately 295 ml of opaque yellow, foul-smelling fluid was successfully aspirated. Sample was sent to the lab for  culture.  The drainage catheter was then secured in place of the 2-0 Prolene suture and connected to bulb suction.  The patient tolerated the procedure well.  There was no immediate complication and the patient was returned to his room in stable condition.  IMPRESSION:  Technically successful CT guided placement of 12-French drainage catheter into the left lower quadrant abscess.  Signed,  Sterling Big, MD Vascular & Interventional Radiologist [facility]  Original Report Authenticated By:  Threasa Beards Abdomen Pelvis W Contrast  08/31/2011  *RADIOLOGY REPORT*  Clinical Data: Diverticulitis.  CT ABDOMEN AND PELVIS WITH CONTRAST  Technique:  Multidetector CT imaging of the abdomen and pelvis was performed following the standard protocol during bolus administration of intravenous contrast.  Contrast: OMNIPAQUE IOHEXOL 300 MG/ML  SOLN  Comparison: 08/27/2011.  Findings: Lung bases show new tiny bilateral pleural effusions, right greater than left.  Heart size normal.  No pericardial effusion.  Small hiatal hernia.  There may be thickening of the distal esophageal wall, which can be seen with gastroesophageal reflux disease.  A 9 mm low attenuation lesion in the right hepatic lobe is unchanged, as is a mixed attenuation lesion in the peripheral right hepatic lobe, measuring 12 mm.  The latter likely represents a hemangioma.  Gallbladder and adrenal glands are unremarkable. Early excretion of contrast is seen in the kidneys.  Spleen, pancreas and stomach are otherwise unremarkable.  Fairly diffuse prominence of the small bowel wall with extensive mesenteric and omental edema.  There are areas of free fluid and locules of air.  High attenuation is seen along the peritoneal border of the right paracolic gutter.  An air fluid collection in the central small bowel mesentery measures 5.0 x 5.6 cm (image 46) and is new.  There is a large collection of fluid and air extending from the left anatomic pelvis into the lower central anatomic pelvis, measuring approximately 8.1 x 11.8 cm, new from 08/27/2011. There are locules of air in the central anatomic pelvis as well. Extensive thickening of the sigmoid colon is seen with extraluminal contrast (example images 69 and 75). Prostate is enlarged.  There is sclerosis involving the left eight posterolateral rib (image 6), which may be post-traumatic in etiology.  IMPRESSION:  1. Progression of perforated sigmoid diverticulitis with extraluminal contrast and air,  as well as formation of two large abscesses in the anatomic pelvis. These results were called by telephone on 08/31/2011 at 1448 hours to Dr. Jamey Ripa, who verbally acknowledged these results. 2.  Extensive inflammatory changes and fluid within the peritoneum. Hyper attenuation along the peritoneal border is indicative of peritonitis. 3.  Apparent thickening of the small bowel wall is likely secondary to adjacent inflammatory changes. 4.  New tiny bilateral pleural effusions.  Original Report Authenticated By: Reyes Ivan, M.D.    Anti-infectives: Anti-infectives     Start     Dose/Rate Route Frequency Ordered Stop   09/01/11 1400   metroNIDAZOLE (FLAGYL) tablet 500 mg        500 mg Oral 3 times per day 09/01/11 0749     08/28/11 1400   metroNIDAZOLE (FLAGYL) tablet 250 mg  Status:  Discontinued        250 mg Oral 3 times per day 08/28/11 1318 09/01/11 0749   08/27/11 1600   ertapenem (INVANZ) 1 g in sodium chloride 0.9 % 50 mL IVPB        1 g 100 mL/hr over 30 Minutes Intravenous Every 24 hours  08/27/11 1534     08/27/11 1500   piperacillin-tazobactam (ZOSYN) IVPB 3.375 g  Status:  Discontinued        3.375 g 12.5 mL/hr over 240 Minutes Intravenous  Once 08/27/11 1436 08/27/11 1553           Assessment/Plan  1. c diff colitis 2. Diverticulitis with abscess, s/p perc drain  Plan: 1. Repeat CT scan to eval if these fluid collections in the pelvis are connected or separate and to make sure he doesn't have a need for further drainage. 2. Keep an eye on his drainage output.  It has changed in color, to a little more stool appearing.  The CT scan may show whether he has a fistula or not if contrast goes into his drain. 3. Cont flagyl for c diff.  Cont invanz for now per Dr. Jamey Ripa.  Awaiting abscess cultures, which are pending right now.   LOS: 6 days    Vernessa Likes E 09/02/2011

## 2011-09-02 NOTE — Procedures (Signed)
Successful placement of a 10 French drain into the abscess/fluid collection within the right lower abdomen Approximately 95 mL of serous, slightly bloody fluid aspirated. No immediate post procedural complications.

## 2011-09-02 NOTE — Progress Notes (Signed)
Peripherally Inserted Central Catheter/Midline Placement  The IV Nurse has discussed with the patient and/or persons authorized to consent for the patient, the purpose of this procedure and the potential benefits and risks involved with this procedure.  The benefits include less needle sticks, lab draws from the catheter and patient may be discharged home with the catheter.  Risks include, but not limited to, infection, bleeding, blood clot (thrombus formation), and puncture of an artery; nerve damage and irregular heat beat.  Alternatives to this procedure were also discussed.  PICC/Midline Placement Documentation        Mellissa Kohut 09/02/2011, 6:34 PM

## 2011-09-02 NOTE — Progress Notes (Signed)
Utilization review completed.  

## 2011-09-02 NOTE — Progress Notes (Signed)
PARENTERAL NUTRITION CONSULT NOTE - INITIAL  Pharmacy Consult for TNA Indication: Prolonged Ileus  No Known Allergies  Patient Measurements: Height: 6\' 2"  (188 cm) Weight: 165 lb 9.1 oz (75.1 kg) IBW/kg (Calculated) : 82.2  Usual Weight: was 78.5 kg 08/22/11  Vital Signs: Temp: 98.4 F (36.9 C) (08/16 0600) Temp src: Oral (08/16 0600) BP: 103/71 mmHg (08/16 0600) Pulse Rate: 103  (08/16 0600) Intake/Output from previous day: 08/15 0701 - 08/16 0700 In: -  Out: 470 [Urine:325; Drains:145] Intake/Output from this shift: Total I/O In: -  Out: 35 [Drains:35]  Labs:  [facility] 09/02/11 0407 09/01/11 0430  WBC 11.1* 10.8*  HGB 11.5* 11.3*  HCT 34.2* 33.5*  PLT 412* 353  APTT -- --  INR -- --     Basename 09/02/11 0407  NA 133*  K 4.0  CL 99  CO2 25  GLUCOSE 91  BUN 19  CREATININE 0.98  LABCREA --  CREAT24HRUR --  CALCIUM 7.6*  MG --  PHOS --  PROT --  ALBUMIN --  AST --  ALT --  ALKPHOS --  BILITOT --  BILIDIR --  IBILI --  PREALBUMIN --  TRIG --  CHOLHDL --  CHOL --   Estimated Creatinine Clearance: 88.3 ml/min (by C-G formula based on Cr of 0.98).   No results found for this basename: GLUCAP:3 in the last 72 hours  Medical History: Past Medical History  Diagnosis Date  . Coronary heart disease   . Hypertension   . Heart murmur   . Hemorrhoids     CBGs & Insulin Requirements in the past 24 hours:  none  Nutritional Goals:  Per RD recommendation (8/16): 1875-2250 kcal/day, 90-110 gm/day protein Clinimix E 5/20 at a goal rate of 80 ml/hr + lipids at 10 ml/hr will provide: 96 g/day protein and 2169 Kcal/day MWF, 1689 Kcal/day STTHS (Avg. 1896 Kcal/day weekly).  Current Nutrition:  NPO IVF: none  Assessment: 21 YOM with perforated diverticulitis, with possible new abscess, prolonged ileus.  Surgery recommending to start parenteral nutrition since patient will need to remain NPO for several days.   Plan:  Patient does not have central  access yet and TNA's have already been prepared by pharmacy for the day so will start TNA tomorrow at 1800 once central line placed.  Will order TNA labs for AM.  Clance Boll 09/02/2011,1:55 PM

## 2011-09-02 NOTE — Progress Notes (Signed)
Patient continues to feel well. No abd pain feels weak from lack of food. Only one BM No fever, P still about 100 Alert and very comfortable. Abdomen soft, not tender except at drain site. No rebound, no guarding. Drain now looks a bit feculent.  Labs wbc stable  CT reviewed with radiologist - a new right collection is present. The abscess with the drain is almost completely resolves - a little bit of residual air. Still some pelvic fluid but no worse and may communicate with the current drain.   Imp: perforated diverticulitis, with possible new abscess, but clinically improving.  Plan: Long discussion with the patient. Told him the standard would be to operate on him, resect his colon and do a colostomy. He has been reluctant, and we have not pushed him this way since his exam has been so benign.  I told him an alternative would be to do a perc drain of the new area, and follow with a CT in a few days. 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 He might be able to recover from the current situation enough to have an elective one stage procedure, but if surgery now he would need colostomy.  He has given this a lot of thought and for now would prefer to get a new drain and see if he can avoid surgery. He knows that this may not work and that he will need surgery early next week.  He also needs TNA as he needs to stay NPO and no nutrition for several days.

## 2011-09-02 NOTE — Progress Notes (Signed)
Subjective: Pt ok. Still feels decent. Repeat CT today  Objective: Physical Exam: BP 103/71  Pulse 103  Temp 98.4 F (36.9 C) (Oral)  Resp 18  Ht 6\' 2"  (1.88 m)  Wt 165 lb 9.1 oz (75.1 kg)  BMI 21.26 kg/m2  SpO2 95% LLQ drain intact, minimally tender st site, but no erythema. Drain output greenish with purulent stranding. 145cc recorded yesterday  Labs: CBC  Basename 09/02/11 0407 09/01/11 0430  WBC 11.1* 10.8*  HGB 11.5* 11.3*  HCT 34.2* 33.5*  PLT 412* 353   BMET  Basename 09/02/11 0407  NA 133*  K 4.0  CL 99  CO2 25  GLUCOSE 91  BUN 19  CREATININE 0.98  CALCIUM 7.6*   LFT No results found for this basename: PROT,ALBUMIN,AST,ALT,ALKPHOS,BILITOT,BILIDIR,IBILI,LIPASE in the last 72 hours PT/INR No results found for this basename: LABPROT:2,INR:2 in the last 72 hours   Studies/Results: Ct Guided Abscess Drain  08/31/2011  *RADIOLOGY REPORT*  CT GUIDED ABSCESS DRAINAGE  Date: 08/31/2011  Clinical History: 56 year old male with diverticulitis and C difficile colitis now with intra-abdominal abscess  Procedures Performed: 1. CT guided abscess drainage and placement of a 12-French biliary drain into the left lower quadrant fluid collection  Interventional Radiologist:  Sterling Big, MD  Sedation: Moderate (conscious) sedation was used.  2 mg Versed, 200 mcg Fentanyl were administered intravenously.  The patient's vital signs were monitored continuously by radiology nursing throughout the procedure.  Sedation Time: 25 minutes  PROCEDURE/FINDINGS:   Informed consent was obtained from the patient following explanation of the procedure, risks, benefits and alternatives. The patient understands, agrees and consents for the procedure. All questions were addressed. A time out was performed.  Maximal barrier sterile technique utilized including caps, mask, sterile gowns, sterile gloves, large sterile drape, hand hygiene, and betadine skin prep.  A planning axial CT scan was  performed.  The fluid collection in the left lower quadrant was identified.  Appropriate skin site was selected and marked.  Local anesthesia was achieved with infiltration of 1% lidocaine.  Using CT guidance, an 18 gauge needle was advanced into the fluid collection and Amplatz wire advanced through the needle.  The tract was then serially dilated, and a 12-French biliary drainage catheter was placed.  Biliary drain was selected for the additional side holes and the relatively large size of the abscess collection.  Approximately 295 ml of opaque yellow, foul-smelling fluid was successfully aspirated. Sample was sent to the lab for culture.  The drainage catheter was then secured in place of the 2-0 Prolene suture and connected to bulb suction.  The patient tolerated the procedure well.  There was no immediate complication and the patient was returned to his room in stable condition.  IMPRESSION:  Technically successful CT guided placement of 12-French drainage catheter into the left lower quadrant abscess.  Signed,  Sterling Big, MD Vascular & Interventional Radiologist [facility]  Original Report Authenticated By: Threasa Beards Abdomen Pelvis W Contrast  09/02/2011  *RADIOLOGY REPORT*  Clinical Data: Abdominal abscesses.  CT ABDOMEN AND PELVIS WITH CONTRAST  Technique:  Multidetector CT imaging of the abdomen and pelvis was performed following the standard protocol during bolus administration of intravenous contrast.  Contrast: OMNIPAQUE IOHEXOL 300 MG/ML  SOLN  Comparison: 08/31/2011 and 08/27/2011  Findings: Abscess drainage catheter remains in the mid pelvis. There is no significant fluid remain in that part of the abscess although there is some air in the abscess in the left  lower quadrant.  The patient has a second abscess in the right pericolic gutter adjacent to the tip of the right lobe of the liver and extending inferiorly around the tip of the cecum.  There is a 2.4 cm abscess in  the pelvic cul-de-sac to the left of midline visible on image number 69 of series 2.  There is a 4.6 cm collection of fluid in the right upper quadrant just anterior and inferior to the gallbladder which is probably an abscess although the periphery of this fluid collection does not enhance. There are small amounts of fluid tracking along the mesentery with peripheral enhancement consistent with pus.  There is an abscess is seen are in puffs in the midline just below the third portion of the duodenum just anterior to the inferior mesenteric artery and abdominal aorta as seen on image number 45 and 46.  There are several small low-density lesions in the liver which appear to enhance after contrast consistent with small hemangiomas. There is more on the periphery of the right lobe seen on image number 18 of series 2 with typical enhancement for hemangioma.  Spleen, pancreas, adrenal glands, and kidneys are normal. Abdominal aorta is normal.  Again under the numerous diverticula in the distal colon.  There is marked enlargement of the prostate gland.  Seminal vesicles are also prominent.  No osseous abnormality.  IMPRESSION:  1.  Extensive right-sided abdominal abscess extending from the right upper quadrant anteriorly into the right pericolic gutter around the tip of the right lobe of the liver extending inferiorly around the tip of the cecum. 2.  Small abscess just inferior to the third portion of the duodenum anterior to the aorta.  3.  Small left pelvic abscess. 4.  Small residual abscess at the site of the drainage catheter in the mid pelvis. 5.  Multiple small abscesses in the mesentery with small amount of fluid in the left pericolic gutter extending into the left side of the pelvis which is probably pus as well.  Original Report Authenticated By: Gwynn Burly, M.D.    Assessment/Plan: Perforated diverticulitis with mult abscesses S/p perc drain to dominant abscess on 8/14 Repeat CT today overall  better but several smaller residual abscesses remain.  Rt pericolic gutter collection is amenable to perc drainage. Have d/w surgery who requests 2nd drain be placed. D/W pt regarding procedure. Risks and benefits explained. Labs ok, he has remained NPO Consent signed in chart.   LOS: 6 days    Brayton El PA-C 09/02/2011 2:13 PM

## 2011-09-02 NOTE — Progress Notes (Signed)
Nutrition Follow-up  Intervention:  Rec TPN   Assessment:  Pt concerned about his nutrition.  Reports not eating solids for 2 weeks.  Pt with diverticulitis with abscess, s/p perc drain. C.diff. Colitis and concern for fistula.  Diet Order:  NPO  Meds: Scheduled Meds:   . ertapenem (INVANZ) IV  1 g Intravenous Q24H  . feeding supplement  1 Container Oral QPC supper  . metoprolol succinate  50 mg Oral Daily  . metroNIDAZOLE  500 mg Oral Q8H   Continuous Infusions:  PRN Meds:.iohexol, morphine injection, ondansetron, sodium chloride  Labs:  CMP     Component Value Date/Time   NA 133* 09/02/2011 0407   K 4.0 09/02/2011 0407   CL 99 09/02/2011 0407   CO2 25 09/02/2011 0407   GLUCOSE 91 09/02/2011 0407   BUN 19 09/02/2011 0407   CREATININE 0.98 09/02/2011 0407   CREATININE 1.38* 08/26/2011 1222   CALCIUM 7.6* 09/02/2011 0407   PROT 7.5 08/27/2011 1136   ALBUMIN 2.6* 08/27/2011 1136   AST 35 08/27/2011 1136   ALT 49 08/27/2011 1136   ALKPHOS 127* 08/27/2011 1136   BILITOT 0.5 08/27/2011 1136   GFRNONAA 90* 09/02/2011 0407   GFRAA >90 09/02/2011 0407     Intake/Output Summary (Last 24 hours) at 09/02/11 1249 Last data filed at 09/02/11 0556  Gross per 24 hour  Intake      0 ml  Output    470 ml  Net   -470 ml    Weight Status:  No new weight Wt Readings from Last 3 Encounters:  08/27/11 165 lb 9.1 oz (75.1 kg)  08/26/11 165 lb (74.844 kg)  08/24/11 168 lb (76.204 kg)  '  Re-estimated needs:  1875-2250 kcal, 90-110 gm protein  Nutrition Dx:  Inadequate oral intake ongoing  Goal:  Diet advancement-not met  Monitor:  Diet advancement, plan of care, weight, labs, i/o   [name] Rein, RD [phone]

## 2011-09-03 LAB — COMPREHENSIVE METABOLIC PANEL
AST: 100 U/L — ABNORMAL HIGH (ref 0–37)
Albumin: 1.8 g/dL — ABNORMAL LOW (ref 3.5–5.2)
Alkaline Phosphatase: 117 U/L (ref 39–117)
Chloride: 97 mEq/L (ref 96–112)
Creatinine, Ser: 0.87 mg/dL (ref 0.50–1.35)
Potassium: 3.7 mEq/L (ref 3.5–5.1)
Sodium: 131 mEq/L — ABNORMAL LOW (ref 135–145)
Total Bilirubin: 0.3 mg/dL (ref 0.3–1.2)

## 2011-09-03 LAB — CBC
HCT: 35.6 % — ABNORMAL LOW (ref 39.0–52.0)
Hemoglobin: 12.1 g/dL — ABNORMAL LOW (ref 13.0–17.0)
MCHC: 34 g/dL (ref 30.0–36.0)
MCV: 89.7 fL (ref 78.0–100.0)
WBC: 9.8 10*3/uL (ref 4.0–10.5)

## 2011-09-03 LAB — DIFFERENTIAL
Basophils Relative: 1 % (ref 0–1)
Lymphocytes Relative: 10 % — ABNORMAL LOW (ref 12–46)
Monocytes Relative: 12 % (ref 3–12)
Neutro Abs: 7.5 10*3/uL (ref 1.7–7.7)

## 2011-09-03 LAB — GLUCOSE, CAPILLARY

## 2011-09-03 LAB — MAGNESIUM: Magnesium: 2 mg/dL (ref 1.5–2.5)

## 2011-09-03 LAB — CULTURE, ROUTINE-ABSCESS

## 2011-09-03 LAB — TRIGLYCERIDES: Triglycerides: 115 mg/dL (ref <150)

## 2011-09-03 MED ORDER — ENOXAPARIN SODIUM 40 MG/0.4ML ~~LOC~~ SOLN
40.0000 mg | SUBCUTANEOUS | Status: DC
  Administered 2011-09-03 – 2011-09-07 (×5): 40 mg via SUBCUTANEOUS
  Filled 2011-09-03 (×6): qty 0.4

## 2011-09-03 MED ORDER — INSULIN ASPART 100 UNIT/ML ~~LOC~~ SOLN
0.0000 [IU] | SUBCUTANEOUS | Status: DC
  Administered 2011-09-04 – 2011-09-08 (×21): 1 [IU] via SUBCUTANEOUS
  Administered 2011-09-08: 3 [IU] via SUBCUTANEOUS
  Administered 2011-09-08: 1 [IU] via SUBCUTANEOUS
  Administered 2011-09-08: 3 [IU] via SUBCUTANEOUS
  Administered 2011-09-09 (×2): 2 [IU] via SUBCUTANEOUS

## 2011-09-03 MED ORDER — CLINIMIX E/DEXTROSE (5/20) 5 % IV SOLN
INTRAVENOUS | Status: AC
  Administered 2011-09-03: 18:00:00 via INTRAVENOUS
  Filled 2011-09-03: qty 1000

## 2011-09-03 NOTE — Progress Notes (Signed)
Cm spoke with patient with spouse & adult son at bedside concerning dc planning. Pt has picc in [facility]. Per pt unsure if discharge plan includes home with PICC. Patient states MD to assess him in office 3x weekly for abdominal drainage tube, feels at this time no HH services needed. CM provided pt with [facility] agency lsit for [facility]. Spouse to assist in home care. No needs specified at this time.    [name] [phone]

## 2011-09-03 NOTE — Progress Notes (Signed)
General Surgery Note  LOS: 7 days  Room - 1604 Perc drain - #3 and 1 PCP - Dr. Malva Limes  Assessment/Plan: 1. Diverticulitis of colon with microperforation  Perc drain - Dr. Odis Luster - 09/02/2011 - Right lower abdomen   And Dr. Archer Asa - 08/31/2011 - LLQ  WBC - 9,800 - 09/03/2011.  On Invanz.  Seems to be slowly progressing.   2.  C. difficile colitis   On po Flagyl. 3.  Malnutrition - to start TPN today  Prealb - 6.8 - 09/03/2011 4.  DVT - will start Lovenox  Subjective:  Looks good and getting hungry.  He is also bored being in the hospital.  He seems to be going in the right direction. Objective:   Filed Vitals:   09/02/11 2156  BP: 111/71  Pulse: 98  Temp: 98.8 F (37.1 C)  Resp: 18     Intake/Output from previous day:  08/16 0701 - 08/17 0700 In: 100 [IV Piggyback:100] Out: 365 [Urine:250; Drains:115]  Intake/Output this shift:      Physical Exam:   General: [name] who is alert and oriented.    HEENT: Normal. Pupils equal. .   Lungs: Clear   Abdomen: Soft.  Has drains coming out of LLQ (feculent) and RLQ (straw colored).   Psychiatric: Has normal mood and affect.   Lab Results:    Basename 09/03/11 0440 09/02/11 0407  WBC 9.8 11.1*  HGB 12.1* 11.5*  HCT 35.6* 34.2*  PLT 364 412*    BMET   Basename 09/03/11 0440 09/02/11 0407  NA 131* 133*  K 3.7 4.0  CL 97 99  CO2 26 25  GLUCOSE 90 91  BUN 18 19  CREATININE 0.87 0.98  CALCIUM 7.6* 7.6*    PT/INR  No results found for this basename: LABPROT:2,INR:2 in the last 72 hours  ABG  No results found for this basename: PHART:2,PCO2:2,PO2:2,HCO3:2 in the last 72 hours   Studies/Results:  Ct Guided Abscess Drain  09/02/2011  *RADIOLOGY REPORT*  Indication: Perforated sigmoid diverticulitis with new/increasing intra-abdominal abscess  CT GUIDED RIGHT LOWER ABDOMINAL DRAINAGE CATHETER PLACEMENT  Comparison: CT abdomen pelvis - 08/31/2011; 08/27/2011; CT guided drainage catheter placement -  08/31/2011  Medications: Fentanyl 200 mcg IV; Versed 4 mg IV  Total Moderate Sedation time: 15 minutes  Contrast: None  Complications: None immediate  Technique / Findings:  Informed written consent was obtained from the patient after a discussion of the risks, benefits and alternatives to treatment. The patient was placed supine on the CT gantry and a pre procedural CT was performed re-demonstrating the known abscess/fluid collection within the right pericolic gutter.  The procedure was planned.   A timeout was performed prior to the initiation of the procedure.  The skin overlying the right lower abdomen was prepped and draped in the usual sterile fashion.   The overlying soft tissues were anesthetized with 1% lidocaine with epinephrine.  An 18 gauge trocar needle was advanced in to the abscess/fluid collection and a short Amplatz super stiff wire was coiled within the abscess/fluid collection.   Appropriate positioning was confirmed with a limited CT scan.  The tract was serially dilated allowing placement of a 10 [place] all-purpose drainage catheter.  Appropriate positioning was confirmed with a limited postprocedural CT scan.  90 ml of serous, slightly bloody fluid was aspirated.  The tube was connected to a drainage bag and sutured in place.  A dressing was placed.  The patient tolerated the procedure  well without immediate post procedural complication.  Impression:  Successful CT guided placement of a 10 [place] all purpose drain catheter into the right lower abdomen with aspiration of 95 mL of serous, slightly bloody fluid.  Original Report Authenticated By: Waynard Reeds, M.D.   Ct Abdomen Pelvis W Contrast  09/02/2011  *RADIOLOGY REPORT*  Clinical Data: Abdominal abscesses.  CT ABDOMEN AND PELVIS WITH CONTRAST  Technique:  Multidetector CT imaging of the abdomen and pelvis was performed following the standard protocol during bolus administration of intravenous contrast.  Contrast: OMNIPAQUE  IOHEXOL 300 MG/ML  SOLN  Comparison: 08/31/2011 and 08/27/2011  Findings: Abscess drainage catheter remains in the mid pelvis. There is no significant fluid remain in that part of the abscess although there is some air in the abscess in the left lower quadrant.  The patient has a second abscess in the right pericolic gutter adjacent to the tip of the right lobe of the liver and extending inferiorly around the tip of the cecum.  There is a 2.4 cm abscess in the pelvic cul-de-sac to the left of midline visible on image number 69 of series 2.  There is a 4.6 cm collection of fluid in the right upper quadrant just anterior and inferior to the gallbladder which is probably an abscess although the periphery of this fluid collection does not enhance. There are small amounts of fluid tracking along the mesentery with peripheral enhancement consistent with pus.  There is an abscess is seen are in puffs in the midline just below the third portion of the duodenum just anterior to the inferior mesenteric artery and abdominal aorta as seen on image number 45 and 46.  There are several small low-density lesions in the liver which appear to enhance after contrast consistent with small hemangiomas. There is more on the periphery of the right lobe seen on image number 18 of series 2 with typical enhancement for hemangioma.  Spleen, pancreas, adrenal glands, and kidneys are normal. Abdominal aorta is normal.  Again under the numerous diverticula in the distal colon.  There is marked enlargement of the prostate gland.  Seminal vesicles are also prominent.  No osseous abnormality.  IMPRESSION:  1.  Extensive right-sided abdominal abscess extending from the right upper quadrant anteriorly into the right pericolic gutter around the tip of the right lobe of the liver extending inferiorly around the tip of the cecum. 2.  Small abscess just inferior to the third portion of the duodenum anterior to the aorta.  3.  Small left pelvic abscess.  4.  Small residual abscess at the site of the drainage catheter in the mid pelvis. 5.  Multiple small abscesses in the mesentery with small amount of fluid in the left pericolic gutter extending into the left side of the pelvis which is probably pus as well.  Original Report Authenticated By: Gwynn Burly, M.D.     Anti-infectives:   Anti-infectives     Start     Dose/Rate Route Frequency Ordered Stop   09/01/11 1400   metroNIDAZOLE (FLAGYL) tablet 500 mg        500 mg Oral 3 times per day 09/01/11 0749     08/28/11 1400   metroNIDAZOLE (FLAGYL) tablet 250 mg  Status:  Discontinued        250 mg Oral 3 times per day 08/28/11 1318 09/01/11 0749   08/27/11 1600   ertapenem (INVANZ) 1 g in sodium chloride 0.9 % 50 mL IVPB  1 g 100 mL/hr over 30 Minutes Intravenous Every 24 hours 08/27/11 1534     08/27/11 1500   piperacillin-tazobactam (ZOSYN) IVPB 3.375 g  Status:  Discontinued        3.375 g 12.5 mL/hr over 240 Minutes Intravenous  Once 08/27/11 1436 08/27/11 1553          Ovidio Kin, MD, FACS Pager: [phone],   [facility] Office: [phone] 09/03/2011

## 2011-09-03 NOTE — Progress Notes (Signed)
PARENTERAL NUTRITION CONSULT NOTE - FOLLOW UP  Pharmacy Consult for TNA   Indication: Prolonged Ileus  No Known Allergies  Patient Measurements: Height: 6\' 2"  (188 cm) Weight: 165 lb 9.1 oz (75.1 kg) IBW/kg (Calculated) : 82.2  Usual Weight: 78.5 kg 08/22/11  Vital Signs: Temp: 98.8 F (37.1 C) (08/16 2156) Temp src: Oral (08/16 2156) BP: 111/71 mmHg (08/16 2156) Pulse Rate: 98  (08/16 2156) Intake/Output from previous day: 08/16 0701 - 08/17 0700 In: 100 [IV Piggyback:100] Out: 185 [Urine:100; Drains:85] Intake/Output from this shift: Total I/O In: -  Out: 150 [Urine:100; Drains:50]  Labs:  [facility] 09/03/11 0440 09/02/11 0407 09/01/11 0430  WBC 9.8 11.1* 10.8*  HGB 12.1* 11.5* 11.3*  HCT 35.6* 34.2* 33.5*  PLT 364 412* 353  APTT -- -- --  INR -- -- --     Basename 09/03/11 0440 09/02/11 0407  NA 131* 133*  K 3.7 4.0  CL 97 99  CO2 26 25  GLUCOSE 90 91  BUN 18 19  CREATININE 0.87 0.98  LABCREA -- --  CREAT24HRUR -- --  CALCIUM 7.6* 7.6*  MG 2.0 --  PHOS 2.7 --  PROT 5.7* --  ALBUMIN 1.8* --  AST 100* --  ALT 67* --  ALKPHOS 117 --  BILITOT 0.3 --  BILIDIR -- --  IBILI -- --  PREALBUMIN -- --  TRIG -- --  CHOLHDL -- --  CHOL -- --   Estimated Creatinine Clearance: 99.5 ml/min (by C-G formula based on Cr of 0.87).   No results found for this basename: GLUCAP:3 in the last 72 hours  Medications:  Scheduled:    . ertapenem (INVANZ) IV  1 g Intravenous Q24H  . feeding supplement  1 Container Oral QPC supper  . fentaNYL      . metoprolol succinate  50 mg Oral Daily  . metroNIDAZOLE  500 mg Oral Q8H  . midazolam       Infusions:    Insulin Requirements in the past 24 hours:  None ordered yet  Nutritional Goals:  Per RD recommendation (8/16): 1875-2250 kcal/day, 90-110 gm/day protein  Clinimix E 5/20 at a goal rate of 80 ml/hr + lipids at 10 ml/hr will provide: 96 g/day protein and 2169 Kcal/day MWF, 1689 Kcal/day STTHS (Avg. 1896 Kcal/day  weekly).  Current Nutrition:  NPO as of 08/31/11 pm  Assessment: 11 YOM admitted 08/31/11 with perforated diverticulitis, with possible new abscess, prolonged ileus. Surgery recommending to start parenteral nutrition since patient will need to remain NPO for several days. Per RD note, pt reports not eating solid foods for 2 weeks. Double lumen PICC placed 8/16 pm. Labs this am wnl, except Na+ (unable to adjust Na-content of TNA), albumin low at 1.8, corrected Ca = 9.4.  Plan:  (at 18:00 tonight) 1) Start Clinimix E 5/20 at 47ml/hr. 2) IV lipids, MVI, and TE on MWF only due to national shortage 3) Start sensitive SSI q4h 4) TNA labs q Mon and Thurs, and tomorrow after TNA starts 5) Watch for signs/symptoms of refeeding syndrome.  Darrol Angel, PharmD Pager: [phone] 09/03/2011,6:21 AM

## 2011-09-03 NOTE — Progress Notes (Signed)
Subjective: Pt ok. Says he feels fine. PICC placed yesterday for TPN.  Objective: Physical Exam: BP 111/71  Pulse 98  Temp 98.8 F (37.1 C) (Oral)  Resp 18  Ht 6\' 2"  (1.88 m)  Wt 165 lb 9.1 oz (75.1 kg)  BMI 21.26 kg/m2  SpO2 96% LLQ drain intact, still brown/green feculent output, 115cc recorded yesterday New RLQ drain intact, output more serosanguinous, not purulent    Labs: CBC  Basename 09/03/11 0440 09/02/11 0407  WBC 9.8 11.1*  HGB 12.1* 11.5*  HCT 35.6* 34.2*  PLT 364 412*   BMET  Basename 09/03/11 0440 09/02/11 0407  NA 131* 133*  K 3.7 4.0  CL 97 99  CO2 26 25  GLUCOSE 90 91  BUN 18 19  CREATININE 0.87 0.98  CALCIUM 7.6* 7.6*   LFT  Basename 09/03/11 0440  PROT 5.7*  ALBUMIN 1.8*  AST 100*  ALT 67*  ALKPHOS 117  BILITOT 0.3  BILIDIR --  IBILI --  LIPASE --   PT/INR No results found for this basename: LABPROT:2,INR:2 in the last 72 hours   Studies/Results: Ct Guided Abscess Drain  09/02/2011  *RADIOLOGY REPORT*  Indication: Perforated sigmoid diverticulitis with new/increasing intra-abdominal abscess  CT GUIDED RIGHT LOWER ABDOMINAL DRAINAGE CATHETER PLACEMENT  Comparison: CT abdomen pelvis - 08/31/2011; 08/27/2011; CT guided drainage catheter placement - 08/31/2011  Medications: Fentanyl 200 mcg IV; Versed 4 mg IV  Total Moderate Sedation time: 15 minutes  Contrast: None  Complications: None immediate  Technique / Findings:  Informed written consent was obtained from the patient after a discussion of the risks, benefits and alternatives to treatment. The patient was placed supine on the CT gantry and a pre procedural CT was performed re-demonstrating the known abscess/fluid collection within the right pericolic gutter.  The procedure was planned.   A timeout was performed prior to the initiation of the procedure.  The skin overlying the right lower abdomen was prepped and draped in the usual sterile fashion.   The overlying soft tissues were  anesthetized with 1% lidocaine with epinephrine.  An 18 gauge trocar needle was advanced in to the abscess/fluid collection and a short Amplatz super stiff wire was coiled within the abscess/fluid collection.   Appropriate positioning was confirmed with a limited CT scan.  The tract was serially dilated allowing placement of a 10 [place] all-purpose drainage catheter.  Appropriate positioning was confirmed with a limited postprocedural CT scan.  90 ml of serous, slightly bloody fluid was aspirated.  The tube was connected to a drainage bag and sutured in place.  A dressing was placed.  The patient tolerated the procedure well without immediate post procedural complication.  Impression:  Successful CT guided placement of a 10 [place] all purpose drain catheter into the right lower abdomen with aspiration of 95 mL of serous, slightly bloody fluid.  Original Report Authenticated By: Waynard Reeds, M.D.   Ct Abdomen Pelvis W Contrast  09/02/2011  *RADIOLOGY REPORT*  Clinical Data: Abdominal abscesses.  CT ABDOMEN AND PELVIS WITH CONTRAST  Technique:  Multidetector CT imaging of the abdomen and pelvis was performed following the standard protocol during bolus administration of intravenous contrast.  Contrast: OMNIPAQUE IOHEXOL 300 MG/ML  SOLN  Comparison: 08/31/2011 and 08/27/2011  Findings: Abscess drainage catheter remains in the mid pelvis. There is no significant fluid remain in that part of the abscess although there is some air in the abscess in the left lower quadrant.  The patient has a  second abscess in the right pericolic gutter adjacent to the tip of the right lobe of the liver and extending inferiorly around the tip of the cecum.  There is a 2.4 cm abscess in the pelvic cul-de-sac to the left of midline visible on image number 69 of series 2.  There is a 4.6 cm collection of fluid in the right upper quadrant just anterior and inferior to the gallbladder which is probably an abscess although the  periphery of this fluid collection does not enhance. There are small amounts of fluid tracking along the mesentery with peripheral enhancement consistent with pus.  There is an abscess is seen are in puffs in the midline just below the third portion of the duodenum just anterior to the inferior mesenteric artery and abdominal aorta as seen on image number 45 and 46.  There are several small low-density lesions in the liver which appear to enhance after contrast consistent with small hemangiomas. There is more on the periphery of the right lobe seen on image number 18 of series 2 with typical enhancement for hemangioma.  Spleen, pancreas, adrenal glands, and kidneys are normal. Abdominal aorta is normal.  Again under the numerous diverticula in the distal colon.  There is marked enlargement of the prostate gland.  Seminal vesicles are also prominent.  No osseous abnormality.  IMPRESSION:  1.  Extensive right-sided abdominal abscess extending from the right upper quadrant anteriorly into the right pericolic gutter around the tip of the right lobe of the liver extending inferiorly around the tip of the cecum. 2.  Small abscess just inferior to the third portion of the duodenum anterior to the aorta.  3.  Small left pelvic abscess. 4.  Small residual abscess at the site of the drainage catheter in the mid pelvis. 5.  Multiple small abscesses in the mesentery with small amount of fluid in the left pericolic gutter extending into the left side of the pelvis which is probably pus as well.  Original Report Authenticated By: Gwynn Burly, M.D.    Assessment/Plan: Perforated diverticulitis with mult abscesses  S/p perc drain to dominant abscess on 8/14, S/p 2nd drain 8/16 to rt pericolic gutter collection Cont to follow.   LOS: 7 days    Brayton El PA-C 09/03/2011 11:39 AM

## 2011-09-04 LAB — PREALBUMIN: Prealbumin: 7.5 mg/dL — ABNORMAL LOW (ref 17.0–34.0)

## 2011-09-04 LAB — CBC
HCT: 33.7 % — ABNORMAL LOW (ref 39.0–52.0)
Hemoglobin: 11.4 g/dL — ABNORMAL LOW (ref 13.0–17.0)
MCH: 30.6 pg (ref 26.0–34.0)
MCHC: 33.8 g/dL (ref 30.0–36.0)

## 2011-09-04 LAB — COMPREHENSIVE METABOLIC PANEL
Alkaline Phosphatase: 102 U/L (ref 39–117)
BUN: 17 mg/dL (ref 6–23)
CO2: 27 mEq/L (ref 19–32)
Chloride: 99 mEq/L (ref 96–112)
GFR calc Af Amer: >90 mL/min (ref >90)
GFR calc non Af Amer: >90 mL/min (ref >90)
Glucose, Bld: 140 mg/dL — ABNORMAL HIGH (ref 70–99)
Potassium: 3.5 mEq/L (ref 3.5–5.1)
Total Bilirubin: 0.2 mg/dL — ABNORMAL LOW (ref 0.3–1.2)

## 2011-09-04 LAB — DIFFERENTIAL
Basophils Relative: 1 % (ref 0–1)
Eosinophils Absolute: 0.1 10*3/uL (ref 0.0–0.7)
Lymphocytes Relative: 13 % (ref 12–46)
Neutro Abs: 5.6 10*3/uL (ref 1.7–7.7)

## 2011-09-04 LAB — TRIGLYCERIDES: Triglycerides: 139 mg/dL (ref <150)

## 2011-09-04 LAB — GLUCOSE, CAPILLARY
Glucose-Capillary: 130 mg/dL — ABNORMAL HIGH (ref 70–99)
Glucose-Capillary: 137 mg/dL — ABNORMAL HIGH (ref 70–99)

## 2011-09-04 MED ORDER — CLINIMIX E/DEXTROSE (5/20) 5 % IV SOLN
INTRAVENOUS | Status: AC
  Administered 2011-09-04: 17:00:00 via INTRAVENOUS
  Filled 2011-09-04: qty 2000

## 2011-09-04 NOTE — Progress Notes (Signed)
PARENTERAL NUTRITION CONSULT NOTE - FOLLOW UP  Pharmacy Consult for TNA   Indication: Prolonged Ileus  No Known Allergies  Patient Measurements: Height: 6\' 2"  (188 cm) Weight: 165 lb 9.1 oz (75.1 kg) IBW/kg (Calculated) : 82.2  Usual Weight: 78.5 kg 08/22/11  Vital Signs: Temp: 98.1 F (36.7 C) (08/18 0400) Temp src: Oral (08/18 0400) BP: 108/74 mmHg (08/18 0400) Pulse Rate: 85  (08/18 0400) Intake/Output from previous day: 08/17 0701 - 08/18 0700 In: 632.7 [P.O.:120; I.V.:10; TPN:492.7] Out: 167 [Urine:50; Drains:117] Intake/Output from this shift: Total I/O In: 622.7 [P.O.:120; Other:10; TPN:492.7] Out: 167 [Urine:50; Drains:117]  Labs:  [facility] 09/04/11 0533 09/03/11 0440 09/02/11 0407  WBC 7.6 9.8 11.1*  HGB 11.4* 12.1* 11.5*  HCT 33.7* 35.6* 34.2*  PLT 395 364 412*  APTT -- -- --  INR -- -- --     Basename 09/04/11 0533 09/03/11 0440 09/02/11 0407  NA 132* 131* 133*  K 3.5 3.7 4.0  CL 99 97 99  CO2 27 26 25   GLUCOSE 140* 90 91  BUN 17 18 19   CREATININE 0.83 0.87 0.98  LABCREA -- -- --  CREAT24HRUR -- -- --  CALCIUM 7.4* 7.6* 7.6*  MG 2.0 2.0 --  PHOS 2.4 2.7 --  PROT 5.2* 5.7* --  ALBUMIN 1.6* 1.8* --  AST 89* 100* --  ALT 66* 67* --  ALKPHOS 102 117 --  BILITOT 0.2* 0.3 --  BILIDIR -- -- --  IBILI -- -- --  PREALBUMIN -- 6.8* --  TRIG -- 115 --  CHOLHDL -- -- --  CHOL -- 59 --   Estimated Creatinine Clearance: 104.3 ml/min (by C-G formula based on Cr of 0.83).    Basename 09/04/11 0400 09/03/11 2353 09/03/11 2005  GLUCAP 130* 133* 118*    Medications:  Scheduled:     . enoxaparin (LOVENOX) injection  40 mg Subcutaneous Q24H  . ertapenem (INVANZ) IV  1 g Intravenous Q24H  . feeding supplement  1 Container Oral QPC supper  . insulin aspart  0-9 Units Subcutaneous Q4H  . metoprolol succinate  50 mg Oral Daily  . metroNIDAZOLE  500 mg Oral Q8H   Infusions:     . TPN (CLINIMIX) +/- additives 40 mL/hr at 09/03/11 1741    Insulin  Requirements in the past 24 hours:  2 units CBGs < 150  Nutritional Goals:  Per RD recommendation (8/16): 1875-2250 kcal/day, 90-110 gm/day protein  Clinimix E 5/20 at a goal rate of 80 ml/hr + lipids at 10 ml/hr will provide: 96 g/day protein and 2169 Kcal/day MWF, 1689 Kcal/day STTHS (Avg. 1896 Kcal/day weekly).  Current Nutrition:  NPO as of 08/31/11 pm  Assessment: 62 YOM admitted 08/31/11 with perforated diverticulitis, with possible new abscess, prolonged ileus. Surgery recommending to start parenteral nutrition since patient will need to remain NPO for several days. Per RD note, pt reports not eating solid foods for 2 weeks. Double lumen PICC placed 8/16 pm, TNA started 8/17pm.   Lytes: wnl except Na low - unable to change TNA content. Corrected Ca = 9.3 (wnl). Watch K, Phos, Mg. Renal: wnl LFTs: elevated, but trending down - monitor CBGs: in goal range < 150 Prealbumin: 6.8 on 8/17 IVF: TNA at 35ml/hr (no MIVF)  Plan:   1) Increase Clinimix E 5/20 to 46ml/hr. - slower advancement due to refeeding risk. Watch K, Phos, and Mg. 2) IV lipids, MVI, and TE on MWF only due to national shortage 3) Continue sensitive SSI q4h 4)  TNA labs q Mon and Thurs. 5) Watch for signs/symptoms of refeeding syndrome.  Darrol Angel, PharmD Pager: [phone] 09/04/2011,6:54 AM

## 2011-09-04 NOTE — Progress Notes (Signed)
General Surgery Note  LOS: 8 days  Room - 1604 Perc drain - #4 and 2 PCP - Dr. Malva Limes  Assessment/Plan: 1. Diverticulitis of colon with microperforation  Perc drain - Dr. Odis Luster - 09/02/2011 - Right lower abdomen   And Dr. Archer Asa - 08/31/2011 - LLQ  WBC - 7,600 - 09/04/2011.  On Invanz.  Says he feels the best he has felt.  The question is when to repeat his CT scan.  His last CT (before the drainage CT's) was 08/31/2011.  So probably repeat CT tomorrow or Tuesday.  I have not ordered it.   2.  C. difficile colitis   On po Flagyl. 3.  Malnutrition - to start TPN today  Prealb - 6.8 - 09/03/2011.  Alb - 1.6 - 09/04/2011. 4.  DVT - will start Lovenox  Subjective:  Doing well.  Feels good.  Wants to go home, but understands hospitalization. Objective:   Filed Vitals:   09/04/11 0400  BP: 108/74  Pulse: 85  Temp: 98.1 F (36.7 C)  Resp: 16     Intake/Output from previous day:  08/17 0701 - 08/18 0700 In: 632.7 [P.O.:120; I.V.:10; TPN:492.7] Out: 167 [Urine:50; Drains:117]  Intake/Output this shift:      Physical Exam:   General: [name] who is alert and oriented.    HEENT: Normal. Pupils equal. .   Lungs: Clear   Abdomen: Soft.  Has drains coming out of LLQ (bile stained today) and RLQ (straw colored).   Psychiatric: Has normal mood and affect.   Lab Results:     Basename 09/04/11 0533 09/03/11 0440  WBC 7.6 9.8  HGB 11.4* 12.1*  HCT 33.7* 35.6*  PLT 395 364    BMET    Basename 09/04/11 0533 09/03/11 0440  NA 132* 131*  K 3.5 3.7  CL 99 97  CO2 27 26  GLUCOSE 140* 90  BUN 17 18  CREATININE 0.83 0.87  CALCIUM 7.4* 7.6*    PT/INR  No results found for this basename: LABPROT:2,INR:2 in the last 72 hours  ABG  No results found for this basename: PHART:2,PCO2:2,PO2:2,HCO3:2 in the last 72 hours   Studies/Results:  Ct Guided Abscess Drain  09/02/2011  *RADIOLOGY REPORT*  Indication: Perforated sigmoid diverticulitis with new/increasing  intra-abdominal abscess  CT GUIDED RIGHT LOWER ABDOMINAL DRAINAGE CATHETER PLACEMENT  Comparison: CT abdomen pelvis - 08/31/2011; 08/27/2011; CT guided drainage catheter placement - 08/31/2011  Medications: Fentanyl 200 mcg IV; Versed 4 mg IV  Total Moderate Sedation time: 15 minutes  Contrast: None  Complications: None immediate  Technique / Findings:  Informed written consent was obtained from the patient after a discussion of the risks, benefits and alternatives to treatment. The patient was placed supine on the CT gantry and a pre procedural CT was performed re-demonstrating the known abscess/fluid collection within the right pericolic gutter.  The procedure was planned.   A timeout was performed prior to the initiation of the procedure.  The skin overlying the right lower abdomen was prepped and draped in the usual sterile fashion.   The overlying soft tissues were anesthetized with 1% lidocaine with epinephrine.  An 18 gauge trocar needle was advanced in to the abscess/fluid collection and a short Amplatz super stiff wire was coiled within the abscess/fluid collection.   Appropriate positioning was confirmed with a limited CT scan.  The tract was serially dilated allowing placement of a 10 [place] all-purpose drainage catheter.  Appropriate positioning was confirmed with a limited  postprocedural CT scan.  90 ml of serous, slightly bloody fluid was aspirated.  The tube was connected to a drainage bag and sutured in place.  A dressing was placed.  The patient tolerated the procedure well without immediate post procedural complication.  Impression:  Successful CT guided placement of a 10 [place] all purpose drain catheter into the right lower abdomen with aspiration of 95 mL of serous, slightly bloody fluid.  Original Report Authenticated By: Waynard Reeds, M.D.   Ct Abdomen Pelvis W Contrast  09/02/2011  *RADIOLOGY REPORT*  Clinical Data: Abdominal abscesses.  CT ABDOMEN AND PELVIS WITH CONTRAST  Technique:   Multidetector CT imaging of the abdomen and pelvis was performed following the standard protocol during bolus administration of intravenous contrast.  Contrast: OMNIPAQUE IOHEXOL 300 MG/ML  SOLN  Comparison: 08/31/2011 and 08/27/2011  Findings: Abscess drainage catheter remains in the mid pelvis. There is no significant fluid remain in that part of the abscess although there is some air in the abscess in the left lower quadrant.  The patient has a second abscess in the right pericolic gutter adjacent to the tip of the right lobe of the liver and extending inferiorly around the tip of the cecum.  There is a 2.4 cm abscess in the pelvic cul-de-sac to the left of midline visible on image number 69 of series 2.  There is a 4.6 cm collection of fluid in the right upper quadrant just anterior and inferior to the gallbladder which is probably an abscess although the periphery of this fluid collection does not enhance. There are small amounts of fluid tracking along the mesentery with peripheral enhancement consistent with pus.  There is an abscess is seen are in puffs in the midline just below the third portion of the duodenum just anterior to the inferior mesenteric artery and abdominal aorta as seen on image number 45 and 46.  There are several small low-density lesions in the liver which appear to enhance after contrast consistent with small hemangiomas. There is more on the periphery of the right lobe seen on image number 18 of series 2 with typical enhancement for hemangioma.  Spleen, pancreas, adrenal glands, and kidneys are normal. Abdominal aorta is normal.  Again under the numerous diverticula in the distal colon.  There is marked enlargement of the prostate gland.  Seminal vesicles are also prominent.  No osseous abnormality.  IMPRESSION:  1.  Extensive right-sided abdominal abscess extending from the right upper quadrant anteriorly into the right pericolic gutter around the tip of the right lobe of the  liver extending inferiorly around the tip of the cecum. 2.  Small abscess just inferior to the third portion of the duodenum anterior to the aorta.  3.  Small left pelvic abscess. 4.  Small residual abscess at the site of the drainage catheter in the mid pelvis. 5.  Multiple small abscesses in the mesentery with small amount of fluid in the left pericolic gutter extending into the left side of the pelvis which is probably pus as well.  Original Report Authenticated By: Gwynn Burly, M.D.     Anti-infectives:   Anti-infectives     Start     Dose/Rate Route Frequency Ordered Stop   09/01/11 1400   metroNIDAZOLE (FLAGYL) tablet 500 mg        500 mg Oral 3 times per day 09/01/11 0749     08/28/11 1400   metroNIDAZOLE (FLAGYL) tablet 250 mg  Status:  Discontinued  250 mg Oral 3 times per day 08/28/11 1318 09/01/11 0749   08/27/11 1600   ertapenem (INVANZ) 1 g in sodium chloride 0.9 % 50 mL IVPB        1 g 100 mL/hr over 30 Minutes Intravenous Every 24 hours 08/27/11 1534     08/27/11 1500   piperacillin-tazobactam (ZOSYN) IVPB 3.375 g  Status:  Discontinued        3.375 g 12.5 mL/hr over 240 Minutes Intravenous  Once 08/27/11 1436 08/27/11 1553          Ovidio Kin, MD, FACS Pager: [phone],   [facility] Office: [phone] 09/04/2011

## 2011-09-05 DIAGNOSIS — E46 Unspecified protein-calorie malnutrition: Secondary | ICD-10-CM

## 2011-09-05 LAB — DIFFERENTIAL
Basophils Absolute: 0.1 10*3/uL (ref 0.0–0.1)
Eosinophils Absolute: 0.1 10*3/uL (ref 0.0–0.7)
Eosinophils Relative: 1 % (ref 0–5)
Lymphocytes Relative: 13 % (ref 12–46)
Neutrophils Relative %: 73 % (ref 43–77)

## 2011-09-05 LAB — COMPREHENSIVE METABOLIC PANEL
ALT: 93 U/L — ABNORMAL HIGH (ref 0–53)
BUN: 16 mg/dL (ref 6–23)
CO2: 27 mEq/L (ref 19–32)
Calcium: 7.4 mg/dL — ABNORMAL LOW (ref 8.4–10.5)
GFR calc Af Amer: >90 mL/min (ref >90)
GFR calc non Af Amer: >90 mL/min (ref >90)
Glucose, Bld: 134 mg/dL — ABNORMAL HIGH (ref 70–99)
Sodium: 132 mEq/L — ABNORMAL LOW (ref 135–145)

## 2011-09-05 LAB — GLUCOSE, CAPILLARY
Glucose-Capillary: 137 mg/dL — ABNORMAL HIGH (ref 70–99)
Glucose-Capillary: 121 mg/dL — ABNORMAL HIGH (ref 70–99)
Glucose-Capillary: 124 mg/dL — ABNORMAL HIGH (ref 70–99)
Glucose-Capillary: 130 mg/dL — ABNORMAL HIGH (ref 70–99)

## 2011-09-05 LAB — CBC
MCH: 30.6 pg (ref 26.0–34.0)
MCV: 90.1 fL (ref 78.0–100.0)
Platelets: 399 10*3/uL (ref 150–400)
RDW: 13.7 % (ref 11.5–15.5)
WBC: 8.2 10*3/uL (ref 4.0–10.5)

## 2011-09-05 LAB — CHOLESTEROL, TOTAL: Cholesterol: 58 mg/dL (ref 0–200)

## 2011-09-05 LAB — MAGNESIUM: Magnesium: 2 mg/dL (ref 1.5–2.5)

## 2011-09-05 LAB — TRIGLYCERIDES: Triglycerides: 172 mg/dL — ABNORMAL HIGH (ref <150)

## 2011-09-05 LAB — ANAEROBIC CULTURE

## 2011-09-05 MED ORDER — FAT EMULSION 20 % IV EMUL
250.0000 mL | INTRAVENOUS | Status: AC
  Administered 2011-09-05: 250 mL via INTRAVENOUS
  Filled 2011-09-05: qty 250

## 2011-09-05 MED ORDER — ZINC TRACE METAL 1 MG/ML IV SOLN
INTRAVENOUS | Status: AC
  Administered 2011-09-05: 18:00:00 via INTRAVENOUS
  Filled 2011-09-05: qty 2000

## 2011-09-05 MED ORDER — RISAQUAD PO CAPS
1.0000 | ORAL_CAPSULE | Freq: Every day | ORAL | Status: DC
  Administered 2011-09-05 – 2011-09-07 (×3): 1 via ORAL
  Filled 2011-09-05 (×4): qty 1

## 2011-09-05 NOTE — Progress Notes (Signed)
PARENTERAL NUTRITION CONSULT NOTE - FOLLOW UP  Pharmacy Consult for TNA   Indication: Prolonged Ileus  No Known Allergies  Patient Measurements: Height: 6\' 2"  (188 cm) Weight: 165 lb 9.1 oz (75.1 kg) IBW/kg (Calculated) : 82.2  Usual Weight: 78.5 kg 08/22/11  Vital Signs: Temp: 98 F (36.7 C) (08/19 0436) Temp src: Oral (08/19 0436) BP: 107/73 mmHg (08/19 0436) Pulse Rate: 98  (08/19 0436) Intake/Output from previous day: 08/18 0701 - 08/19 0700 In: 255 [P.O.:240; I.V.:10] Out: 102 [Drains:102] Intake/Output from this shift: Total I/O In: 5 [Other:5] Out: 15 [Drains:15]  Labs:  [facility] 09/05/11 0500 09/04/11 0533 09/03/11 0440  WBC 8.2 7.6 9.8  HGB 11.4* 11.4* 12.1*  HCT 33.6* 33.7* 35.6*  PLT 399 395 364  APTT -- -- --  INR -- -- --     Basename 09/05/11 0500 09/04/11 0536 09/04/11 0533 09/03/11 0440  NA 132* -- 132* 131*  K 4.1 -- 3.5 3.7  CL 99 -- 99 97  CO2 27 -- 27 26  GLUCOSE 134* -- 140* 90  BUN 16 -- 17 18  CREATININE 0.78 -- 0.83 0.87  LABCREA -- -- -- --  CREAT24HRUR -- -- -- --  CALCIUM 7.4* -- 7.4* 7.6*  MG 2.0 -- 2.0 2.0  PHOS 2.4 -- 2.4 2.7  PROT 5.4* -- 5.2* 5.7*  ALBUMIN 1.6* -- 1.6* 1.8*  AST 165* -- 89* 100*  ALT 93* -- 66* 67*  ALKPHOS 99 -- 102 117  BILITOT 0.2* -- 0.2* 0.3  BILIDIR -- -- -- --  IBILI -- -- -- --  PREALBUMIN -- -- 7.5* 6.8*  TRIG 172* 139 -- 115  CHOLHDL -- -- -- --  CHOL 58 57 -- 59   Estimated Creatinine Clearance: 108.2 ml/min (by C-G formula based on Cr of 0.78).    Basename 09/05/11 0723 09/05/11 0427 09/04/11 2359  GLUCAP 121* 137* 124*    Medications:  Scheduled:     . acidophilus  1 capsule Oral Daily  . enoxaparin (LOVENOX) injection  40 mg Subcutaneous Q24H  . ertapenem (INVANZ) IV  1 g Intravenous Q24H  . feeding supplement  1 Container Oral QPC supper  . insulin aspart  0-9 Units Subcutaneous Q4H  . metoprolol succinate  50 mg Oral Daily  . metroNIDAZOLE  500 mg Oral Q8H   Infusions:       . TPN (CLINIMIX) +/- additives 40 mL/hr at 09/03/11 1741  . TPN (CLINIMIX) +/- additives 55 mL/hr at 09/04/11 1720    Insulin Requirements in the past 24 hours:  8 units SSI/24h CBGs = 100-137  Nutritional Goals:  Per RD recommendation (8/16): 1875-2250 kcal/day, 90-110 gm/day protein  Clinimix E 5/20 at a goal rate of 80 ml/hr + lipids at 10 ml/hr will provide: 96 g/day protein and 2169 Kcal/day MWF, 1689 Kcal/day STTHS (Avg. 1896 Kcal/day weekly).  Current Nutrition:  Clinimix E5/20 at 24ml/hr Clear liquids started 8/19  Assessment: 56 YOM admitted 08/31/11 with perforated diverticulitis, with possible new abscess, prolonged ileus. Surgery recommended to start parenteral nutrition since patient will need to remain NPO for several days. Per RD note, pt reports not eating solid foods for 2 weeks. Double lumen PICC placed 8/16 pm, TNA started 8/17pm.   Lytes: wnl except Na low - unable to change TNA content. Corrected Ca = 9.3 (wnl). Others WNL, watch for refeeding - no signs this far Renal: wnl LFTs: elevated, but trending down - monitor CBGs: in goal range < 150 Triglycerides:  172 on 8/19 Prealbumin: 6.8 on 8/17 IVF: TNA at 44ml/hr (no MIVF)  Plan:   1) Increase Clinimix E 5/20 to 52ml/hr. - slower advancement due to refeeding risk. Watch K, Phos, and Mg. 2) IV lipids, MVI, and TE on MWF only due to national shortage 3) Continue sensitive SSI q4h 4) TNA labs q Mon and Thurs. 5) Continue to monitor for signs/symptoms of refeeding syndrome. 6) Monitor diet advancement/tolerance and adjust TPN accordingly   Juliette Alcide, PharmD, BCPS.   Pager: [phone]  09/05/2011,10:26 AM

## 2011-09-05 NOTE — Progress Notes (Signed)
Patient ID: [name], male   DOB: [birth year], 56 y.o.   MRN: [id number]    Subjective: Pt feels well.  Very eager to get home.  Objective: Vital signs in last 24 hours: Temp:  [97.8 F (36.6 C)-98.3 F (36.8 C)] 98 F (36.7 C) (08/19 0436) Pulse Rate:  [85-98] 98  (08/19 0436) Resp:  [16-20] 20  (08/19 0436) BP: (95-107)/(64-75) 107/73 mmHg (08/19 0436) SpO2:  [96 %-100 %] 98 % (08/19 0436) Last BM Date: 09/04/11  Intake/Output from previous day: 08/18 0701 - 08/19 0700 In: 255 [P.O.:240; I.V.:10] Out: 102 [Drains:102] Intake/Output this shift: Total I/O In: 5 [Other:5] Out: 15 [Drains:15]  PE: Abd: soft, NT, ND, +BS, JP on left with some green tinged output, minimal as it was just emptied.  Drain on the right with no current output.  Lab Results:   Basename 09/05/11 0500 09/04/11 0533  WBC 8.2 7.6  HGB 11.4* 11.4*  HCT 33.6* 33.7*  PLT 399 395   BMET  Basename 09/05/11 0500 09/04/11 0533  NA 132* 132*  K 4.1 3.5  CL 99 99  CO2 27 27  GLUCOSE 134* 140*  BUN 16 17  CREATININE 0.78 0.83  CALCIUM 7.4* 7.4*   PT/INR No results found for this basename: LABPROT:2,INR:2 in the last 72 hours CMP     Component Value Date/Time   NA 132* 09/05/2011 0500   K 4.1 09/05/2011 0500   CL 99 09/05/2011 0500   CO2 27 09/05/2011 0500   GLUCOSE 134* 09/05/2011 0500   BUN 16 09/05/2011 0500   CREATININE 0.78 09/05/2011 0500   CREATININE 1.38* 08/26/2011 1222   CALCIUM 7.4* 09/05/2011 0500   PROT 5.4* 09/05/2011 0500   ALBUMIN 1.6* 09/05/2011 0500   AST 165* 09/05/2011 0500   ALT 93* 09/05/2011 0500   ALKPHOS 99 09/05/2011 0500   BILITOT 0.2* 09/05/2011 0500   GFRNONAA >90 09/05/2011 0500   GFRAA >90 09/05/2011 0500   Lipase     Component Value Date/Time   LIPASE 59 08/27/2011 1136       Studies/Results: No results found.  Anti-infectives: Anti-infectives     Start     Dose/Rate Route Frequency Ordered Stop   09/01/11 1400   metroNIDAZOLE (FLAGYL) tablet 500 mg          500 mg Oral 3 times per day 09/01/11 0749     08/28/11 1400   metroNIDAZOLE (FLAGYL) tablet 250 mg  Status:  Discontinued        250 mg Oral 3 times per day 08/28/11 1318 09/01/11 0749   08/27/11 1600   ertapenem (INVANZ) 1 g in sodium chloride 0.9 % 50 mL IVPB        1 g 100 mL/hr over 30 Minutes Intravenous Every 24 hours 08/27/11 1534     08/27/11 1500   piperacillin-tazobactam (ZOSYN) IVPB 3.375 g  Status:  Discontinued        3.375 g 12.5 mL/hr over 240 Minutes Intravenous  Once 08/27/11 1436 08/27/11 1553           Assessment/Plan  1. Complicated diverticulitis with multiple abscesses and drains 2. c diff colitis 3. PCM/TNA  Plan: 1. Will allow clears, but cont TNA for now 2. Earliest a repeat CT can be done is Wednesday as last scan on Friday.  I d/w the patient.  He is aware, despite the fact that he really wants to go home. 3. Cont IV abx therapy 4. Cont po flagyl for  c diff   LOS: 9 days    Ronique Simerly E 09/05/2011

## 2011-09-05 NOTE — Progress Notes (Signed)
Nutrition Follow-up  Intervention:  TPN (Clinimix E 5/15) at 55 ml/hr to increase to goal of 80 ml/hr.  20% lipids at 10 ml/hr MWF only.   Goal TPN plus lipids to provide an average of 2169 kcal, 96 g protein per day.  This will meet 100% estimated needs.  Will Monitor TPN with pharmacy.  Monitor diet initiation.   Assessment:   Pt with diverticulitis of colon with microperforation.  Repeat CT possibly Wednesday.  Diet Order:  Clear Liquid -Started this am  Meds: Scheduled Meds:   . acidophilus  1 capsule Oral Daily  . enoxaparin (LOVENOX) injection  40 mg Subcutaneous Q24H  . ertapenem (INVANZ) IV  1 g Intravenous Q24H  . feeding supplement  1 Container Oral QPC supper  . insulin aspart  0-9 Units Subcutaneous Q4H  . metoprolol succinate  50 mg Oral Daily  . metroNIDAZOLE  500 mg Oral Q8H   Continuous Infusions:   . TPN (CLINIMIX) +/- additives 40 mL/hr at 09/03/11 1741  . TPN (CLINIMIX) +/- additives 55 mL/hr at 09/04/11 1720   PRN Meds:.morphine injection, ondansetron, sodium chloride, sodium chloride  Labs:  CMP     Component Value Date/Time   NA 132* 09/05/2011 0500   K 4.1 09/05/2011 0500   CL 99 09/05/2011 0500   CO2 27 09/05/2011 0500   GLUCOSE 134* 09/05/2011 0500   BUN 16 09/05/2011 0500   CREATININE 0.78 09/05/2011 0500   CREATININE 1.38* 08/26/2011 1222   CALCIUM 7.4* 09/05/2011 0500   PROT 5.4* 09/05/2011 0500   ALBUMIN 1.6* 09/05/2011 0500   AST 165* 09/05/2011 0500   ALT 93* 09/05/2011 0500   ALKPHOS 99 09/05/2011 0500   BILITOT 0.2* 09/05/2011 0500   GFRNONAA >90 09/05/2011 0500   GFRAA >90 09/05/2011 0500  Results for NACHMAN, SUNDT (MRN [id number]) as of 09/05/2011 09:30  Ref. Range 09/03/2011 04:40 09/04/2011 05:33  Prealbumin Latest Range: 17.0-34.0 mg/dL 6.8 (L) 7.5 (L)     Intake/Output Summary (Last 24 hours) at 09/05/11 0915 Last data filed at 09/05/11 0716  Gross per 24 hour  Intake    260 ml  Output    117 ml  Net    143 ml    Weight Status:  No new  weight  Re-estimated needs:  1875-2250 kcal, 90-110 gm protein  Nutrition Dx:  Inadequate oral intake ongoing  Goal:  Diet advancement- progressing  Monitor:  Diet advancement, plan of care, weight, labs, i/o   [name] Rein, RD [phone]

## 2011-09-05 NOTE — Progress Notes (Signed)
General Surgery [facility] Will review CT scan from past week.  Continue IV abx's and po abx's as per current regimen.  Allow clear liquid diet.  Tentatively repeat CT scan of abdomen on Wednesday. Velora Heckler, MD, [facility] Office: [phone]

## 2011-09-05 NOTE — Progress Notes (Signed)
  Subjective: No new complaints. Trying to advance diet to CL.   Objective: Vital signs in last 24 hours: Temp:  [97.8 F (36.6 C)-98.8 F (37.1 C)] 98.8 F (37.1 C) (08/19 1400) Pulse Rate:  [85-108] 95  (08/19 1400) Resp:  [18-20] 18  (08/19 1400) BP: (100-107)/(62-75) 107/72 mmHg (08/19 1400) SpO2:  [98 %-100 %] 99 % (08/19 1400) Last BM Date: 09/05/11  Intake/Output from previous day: 08/18 0701 - 08/19 0700 In: 255 [P.O.:240; I.V.:10] Out: 102 [Drains:102] Intake/Output this shift: Total I/O In: 750 [P.O.:240; Other:15; TPN:495] Out: 50 [Drains:50]  PE: drains intact.  LLQ with 122 ml output recorded since yesterday; RLQ drain with only 20 ml output recorded - ? Being flushed - discussed with surgery - to begin flushing to day. Green tinged serous fluid in left drain - scant fluid in RLQ drain.  Soft abdomen. Tender mildly at drain sites. Positive bowel sounds.   Lab Results:   Basename 09/05/11 0500 09/04/11 0533  WBC 8.2 7.6  HGB 11.4* 11.4*  HCT 33.6* 33.7*  PLT 399 395   BMET  Basename 09/05/11 0500 09/04/11 0533  NA 132* 132*  K 4.1 3.5  CL 99 99  CO2 27 27  GLUCOSE 134* 140*  BUN 16 17  CREATININE 0.78 0.83  CALCIUM 7.4* 7.4*   Studies/Results: No results found.  Anti-infectives: Anti-infectives     Start     Dose/Rate Route Frequency Ordered Stop   09/01/11 1400   metroNIDAZOLE (FLAGYL) tablet 500 mg        500 mg Oral 3 times per day 09/01/11 0749     08/28/11 1400   metroNIDAZOLE (FLAGYL) tablet 250 mg  Status:  Discontinued        250 mg Oral 3 times per day 08/28/11 1318 09/01/11 0749   08/27/11 1600   ertapenem (INVANZ) 1 g in sodium chloride 0.9 % 50 mL IVPB        1 g 100 mL/hr over 30 Minutes Intravenous Every 24 hours 08/27/11 1534     08/27/11 1500   piperacillin-tazobactam (ZOSYN) IVPB 3.375 g  Status:  Discontinued        3.375 g 12.5 mL/hr over 240 Minutes Intravenous  Once 08/27/11 1436 08/27/11 1553           Assessment/Plan:  Diverticulitis with abscess s/p percutaneous drainage x 2; placed 8/14 and 8/16.  To continue gentle flushes and output recordings.  Possible follow up CT on Wednesday. IR to follow   LOS: 9 days    CAMPBELL,PAMELA D 09/05/2011

## 2011-09-06 LAB — GLUCOSE, CAPILLARY
Glucose-Capillary: 127 mg/dL — ABNORMAL HIGH (ref 70–99)
Glucose-Capillary: 130 mg/dL — ABNORMAL HIGH (ref 70–99)
Glucose-Capillary: 131 mg/dL — ABNORMAL HIGH (ref 70–99)
Glucose-Capillary: 137 mg/dL — ABNORMAL HIGH (ref 70–99)

## 2011-09-06 LAB — MAGNESIUM: Magnesium: 1.9 mg/dL (ref 1.5–2.5)

## 2011-09-06 LAB — BASIC METABOLIC PANEL
CO2: 27 mEq/L (ref 19–32)
Calcium: 7.3 mg/dL — ABNORMAL LOW (ref 8.4–10.5)
Potassium: 3.7 mEq/L (ref 3.5–5.1)
Sodium: 129 mEq/L — ABNORMAL LOW (ref 135–145)

## 2011-09-06 LAB — PHOSPHORUS: Phosphorus: 2.4 mg/dL (ref 2.3–4.6)

## 2011-09-06 MED ORDER — CLINIMIX E/DEXTROSE (5/20) 5 % IV SOLN
INTRAVENOUS | Status: AC
  Administered 2011-09-06: 18:00:00 via INTRAVENOUS
  Filled 2011-09-06: qty 2000

## 2011-09-06 NOTE — Progress Notes (Signed)
Patient ID: [name], male   DOB: 1955/05/28, 56 y.o.   MRN: [id number]  General Surgery - [facility] - Progress Note  Subjective: Patient comfortable.  No nausea or emesis - tolerating clear liquid diet.  Ambulatory.  Complains of pain at drain sites, left > right.  Objective: Vital signs in last 24 hours: Temp:  [98.2 F (36.8 C)-98.8 F (37.1 C)] 98.2 F (36.8 C) (08/20 0600) Pulse Rate:  [95-108] 98  (08/20 0600) Resp:  [16-18] 16  (08/20 0600) BP: (102-112)/(62-79) 105/72 mmHg (08/20 0600) SpO2:  [99 %] 99 % (08/20 0600) Last BM Date: 09/05/11  Intake/Output from previous day: 08/19 0701 - 08/20 0700 In: 2150.2 [P.O.:240; IV Piggyback:150; TPN:1745.2] Out: 180 [Drains:180]  Exam: HEENT - clear, not icteric Neck - soft Chest - clear bilaterally Cor - RRR, no murmur Abd - soft, minimal distension; drains bilat abd wall; thin greenish fluid from left abd drain; minimal from right abd drain; no tenderness Ext - no significant edema Neuro - grossly intact, no focal deficits  Lab Results:   Basename 09/05/11 0500 09/04/11 0533  WBC 8.2 7.6  HGB 11.4* 11.4*  HCT 33.6* 33.7*  PLT 399 395     Basename 09/06/11 0440 09/05/11 0500  NA 129* 132*  K 3.7 4.1  CL 97 99  CO2 27 27  GLUCOSE 123* 134*  BUN 14 16  CREATININE 0.78 0.78  CALCIUM 7.3* 7.4*    Studies/Results: No results found.  Assessment / Plan: 1.  Acute diverticulitis with multiple abscesses  - continue perc drainage - repeat CT abd tomorrow to assess adequacy and position of drains  - possible fistulous connection to left drain - monitor output - may need drain injection to assess  - IV Invanz 2.  C. Diff colitis  - on po Flagyl, isolation precautions  Velora Heckler, MD, [facility] Office: [phone]  09/06/2011

## 2011-09-06 NOTE — Progress Notes (Signed)
  Subjective: Diverticular abscess Drain #1 placed 8/14 Drain #2 placed 8/16 Pt feels so much better For CT and possible drain injection of Left drain 8/21  Objective: Vital signs in last 24 hours: Temp:  [98.2 F (36.8 C)-98.8 F (37.1 C)] 98.2 F (36.8 C) (08/20 0600) Pulse Rate:  [95-108] 98  (08/20 0600) Resp:  [16-18] 16  (08/20 0600) BP: (105-112)/(72-79) 105/72 mmHg (08/20 0600) SpO2:  [99 %] 99 % (08/20 0600) Last BM Date: 09/05/11  Intake/Output from previous day: 08/19 0701 - 08/20 0700 In: 2150.2 [P.O.:240; IV Piggyback:150; TPN:1745.2] Out: 180 [Drains:180] Intake/Output this shift: Total I/O In: 360 [P.O.:360] Out: 50 [Drains:50]  PE:  Rt drain output 35 cc yesterday; scant now Output serous Lt drain output 145 cc yesterday; 60 cc today Output greenish Sites are clean and dry No sign of infection  Lab Results:   Basename 09/05/11 0500 09/04/11 0533  WBC 8.2 7.6  HGB 11.4* 11.4*  HCT 33.6* 33.7*  PLT 399 395   BMET  Basename 09/06/11 0440 09/05/11 0500  NA 129* 132*  K 3.7 4.1  CL 97 99  CO2 27 27  GLUCOSE 123* 134*  BUN 14 16  CREATININE 0.78 0.78  CALCIUM 7.3* 7.4*   PT/INR No results found for this basename: LABPROT:2,INR:2 in the last 72 hours ABG No results found for this basename: PHART:2,PCO2:2,PO2:2,HCO3:2 in the last 72 hours  Studies/Results: No results found.  Anti-infectives: Anti-infectives     Start     Dose/Rate Route Frequency Ordered Stop   09/01/11 1400   metroNIDAZOLE (FLAGYL) tablet 500 mg        500 mg Oral 3 times per day 09/01/11 0749     08/28/11 1400   metroNIDAZOLE (FLAGYL) tablet 250 mg  Status:  Discontinued        250 mg Oral 3 times per day 08/28/11 1318 09/01/11 0749   08/27/11 1600   ertapenem (INVANZ) 1 g in sodium chloride 0.9 % 50 mL IVPB        1 g 100 mL/hr over 30 Minutes Intravenous Every 24 hours 08/27/11 1534     08/27/11 1500   piperacillin-tazobactam (ZOSYN) IVPB 3.375 g  Status:   Discontinued        3.375 g 12.5 mL/hr over 240 Minutes Intravenous  Once 08/27/11 1436 08/27/11 1553          Assessment/Plan: s/p Diverticular abscess drains placed 8/14 and 8/16  Doing well For CT in am Poss drain inj of left drain in IR Hopefully home soon  Damarien Nyman A 09/06/2011

## 2011-09-06 NOTE — Progress Notes (Signed)
PARENTERAL NUTRITION CONSULT NOTE - FOLLOW UP  Pharmacy Consult for TNA   Indication: Prolonged Ileus  No Known Allergies  Patient Measurements: Height: 6\' 2"  (188 cm) Weight: 165 lb 9.1 oz (75.1 kg) IBW/kg (Calculated) : 82.2  Usual Weight: 78.5 kg 08/22/11  Vital Signs: Temp: 98.2 F (36.8 C) (08/20 0600) Temp src: Oral (08/20 0600) BP: 105/72 mmHg (08/20 0600) Pulse Rate: 98  (08/20 0600) Intake/Output from previous day: 08/19 0701 - 08/20 0700 In: 2150.2 [P.O.:240; IV Piggyback:150; TPN:1745.2] Out: 180 [Drains:180] Intake/Output from this shift:    Labs:  [facility] 09/05/11 0500 09/04/11 0533  WBC 8.2 7.6  HGB 11.4* 11.4*  HCT 33.6* 33.7*  PLT 399 395  APTT -- --  INR -- --     Basename 09/06/11 0440 09/05/11 0500 09/04/11 0536 09/04/11 0533  NA 129* 132* -- 132*  K 3.7 4.1 -- 3.5  CL 97 99 -- 99  CO2 27 27 -- 27  GLUCOSE 123* 134* -- 140*  BUN 14 16 -- 17  CREATININE 0.78 0.78 -- 0.83  LABCREA -- -- -- --  CREAT24HRUR -- -- -- --  CALCIUM 7.3* 7.4* -- 7.4*  MG 1.9 2.0 -- 2.0  PHOS 2.4 2.4 -- 2.4  PROT -- 5.4* -- 5.2*  ALBUMIN -- 1.6* -- 1.6*  AST -- 165* -- 89*  ALT -- 93* -- 66*  ALKPHOS -- 99 -- 102  BILITOT -- 0.2* -- 0.2*  BILIDIR -- -- -- --  IBILI -- -- -- --  PREALBUMIN -- 8.7* -- 7.5*  TRIG -- 172* 139 --  CHOLHDL -- -- -- --  CHOL -- 58 57 --   Estimated Creatinine Clearance: 108.2 ml/min (by C-G formula based on Cr of 0.78).    Basename 09/06/11 0424 09/06/11 0010 09/05/11 1957  GLUCAP 133* 131* 130*    Medications:  Scheduled:     . acidophilus  1 capsule Oral Daily  . enoxaparin (LOVENOX) injection  40 mg Subcutaneous Q24H  . ertapenem (INVANZ) IV  1 g Intravenous Q24H  . feeding supplement  1 Container Oral QPC supper  . insulin aspart  0-9 Units Subcutaneous Q4H  . metoprolol succinate  50 mg Oral Daily  . metroNIDAZOLE  500 mg Oral Q8H   Infusions:     . TPN (CLINIMIX) +/- additives 80 mL/hr at 09/05/11 1746   And  . fat emulsion 250 mL (09/05/11 1746)  . TPN (CLINIMIX) +/- additives 55 mL/hr at 09/04/11 1720    Insulin Requirements in the past 24 hours:  6 units SSI/24h CBGs within goal 150mg /dl  Nutritional Goals:  Per RD recommendation (8/16): 1875-2250 kcal/day, 90-110 gm/day protein  Clinimix E 5/20 at a goal rate of 80 ml/hr + lipids at 10 ml/hr will provide: 96 g/day protein and 2169 Kcal/day MWF, 1689 Kcal/day STTHS (Avg. 1896 Kcal/day weekly).  Current Nutrition:  Clinimix E5/20 at 64ml/hr Clear liquids started 8/19  Assessment: 56 YOM admitted 08/31/11 with perforated diverticulitis, with possible new abscess, prolonged ileus. S/p percutaneous drains x 2.  Surgery recommended to start parenteral nutrition since patient will need to remain NPO for several days. Per RD note, pt reports not eating solid foods for 2 weeks. Double lumen PICC placed 8/16 pm, TNA started 8/17pm.   Lytes: wnl except Na low - unable to change TNA content. Corrected Ca = 9.22 (wnl). Others WNL, watch for refeeding - no signs this far Renal: wnl Drain output/24h = LFTs: elevated, increased from previous CBGs:  in goal range < 150 Triglycerides: 172 on 8/19 Prealbumin: 6.8 on 8/17, 8.7 on 8/19 IVF: TNA at 37ml/hr (no MIVF)  Plan:   1) Continue Clinimix E 5/20 to 23ml/hr at goal rate 2) IV lipids, MVI, and TE on MWF only due to national shortage 3) Continue sensitive SSI q4h 4) TNA labs q Mon and Thurs. 5) Monitor diet advancement/tolerance and adjust TPN accordingly   Juliette Alcide, PharmD, BCPS.   Pager: [phone]  09/06/2011,7:17 AM

## 2011-09-06 NOTE — Care Management Note (Addendum)
    Page 1 of 2   09/14/2011     2:58:00 PM   CARE MANAGEMENT NOTE 09/14/2011  Patient:  [name], [name]   Account Number:  [id number]  Date Initiated:  08/29/2011  Documentation initiated by:  Colleen Can  Subjective/Objective Assessment:   dx sigmoid diverticulitis     Action/Plan:   Home upon discharge   Anticipated DC Date:  09/16/2011   Anticipated DC Plan:  HOME/SELF CARE  In-house referral  NA      DC Planning Services  CM consult      PAC Choice  NA   Choice offered to / List presented to:  C-1 Patient   DME arranged  NA      DME agency  NA     HH arranged  NA      HH agency  NA   Status of service:  Completed, signed off Medicare Important Message given?  NO (If response is "NO", the following Medicare IM given date fields will be blank) Date Medicare IM given:   Date Additional Medicare IM given:    Discharge Disposition:  HOME/SELF CARE  Per UR Regulation:  Reviewed for med. necessity/level of care/duration of stay  If discussed at Long Length of Stay Meetings, dates discussed:   09/06/2011    Comments:  09-14-11 Patient continues to decline [facility] services  09-08-11 Return to OR  09-06-11 [name] RN CM 1400 Spoke with patient at bedside. Continues to feel that [facility] services are not needed. Feels very comfortable with support and follow up currently in place. Plan for CT tomorrow and if okay will d/c home after xray. Will continue to follow for d/c needs.  09/03/11 1731 [name] [phone] Cm spoke with patient with spouse & adult son at bedside concerning dc planning. Pt has picc in [facility]. Per pt unsure if discharge plan includes home with PICC. Patient states MD to assess him in office 3x weekly for abdominal drainage tube, feels at this time no HH services needed. CM provided pt with [facility] agency lsit for [facility]. Spouse to assist in home care. No needs specified at this time.   09/02/2011 [name] BSN CCM  [phone] Pt for CT scan today. Continues on IV abx, CM will follow for needs.

## 2011-09-07 ENCOUNTER — Inpatient Hospital Stay (HOSPITAL_COMMUNITY): Payer: Managed Care, Other (non HMO)

## 2011-09-07 LAB — GLUCOSE, CAPILLARY
Glucose-Capillary: 117 mg/dL — ABNORMAL HIGH (ref 70–99)
Glucose-Capillary: 113 mg/dL — ABNORMAL HIGH (ref 70–99)

## 2011-09-07 MED ORDER — ZINC TRACE METAL 1 MG/ML IV SOLN
INTRAVENOUS | Status: AC
  Administered 2011-09-07: 17:00:00 via INTRAVENOUS
  Filled 2011-09-07: qty 2000

## 2011-09-07 MED ORDER — FAT EMULSION 20 % IV EMUL
240.0000 mL | INTRAVENOUS | Status: AC
  Administered 2011-09-07: 240 mL via INTRAVENOUS
  Filled 2011-09-07: qty 250

## 2011-09-07 MED ORDER — IOHEXOL 300 MG/ML  SOLN
100.0000 mL | Freq: Once | INTRAMUSCULAR | Status: AC | PRN
  Administered 2011-09-07: 100 mL via INTRAVENOUS

## 2011-09-07 MED ORDER — FLUCONAZOLE 100MG IVPB
100.0000 mg | INTRAVENOUS | Status: DC
  Administered 2011-09-07 – 2011-09-12 (×5): 100 mg via INTRAVENOUS
  Filled 2011-09-07 (×7): qty 50

## 2011-09-07 MED ORDER — DEXTROSE 5 % IV SOLN
1.0000 g | INTRAVENOUS | Status: DC
  Administered 2011-09-07 – 2011-09-14 (×8): 1 g via INTRAVENOUS
  Filled 2011-09-07 (×9): qty 10

## 2011-09-07 NOTE — Progress Notes (Addendum)
Patient ID: [name], male   DOB: [birth year], 56 y.o.   MRN: [id number]    Subjective: Pt feels well.  Up shaving.  Really eager to go home.  Objective: Vital signs in last 24 hours: Temp:  [98.3 F (36.8 C)-98.6 F (37 C)] 98.6 F (37 C) (08/21 0636) Pulse Rate:  [97-105] 105  (08/21 0636) Resp:  [16-20] 16  (08/21 0636) BP: (102-105)/(68-73) 103/68 mmHg (08/21 0636) SpO2:  [97 %-99 %] 99 % (08/21 0636) Last BM Date: 09/05/11  Intake/Output from previous day: 08/20 0701 - 08/21 0700 In: 3548.5 [P.O.:1320; TPN:2218.5] Out: 150 [Drains:150] Intake/Output this shift: Total I/O In: 240 [P.O.:240] Out: -   PE: Abd: soft, right drain with almost no output.  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  Drain CX: few candida albicans  Lab Results:   Basename 09/05/11 0500  WBC 8.2  HGB 11.4*  HCT 33.6*  PLT 399   BMET  Basename 09/06/11 0440 09/05/11 0500  NA 129* 132*  K 3.7 4.1  CL 97 99  CO2 27 27  GLUCOSE 123* 134*  BUN 14 16  CREATININE 0.78 0.78  CALCIUM 7.3* 7.4*   PT/INR No results found for this basename: LABPROT:2,INR:2 in the last 72 hours CMP     Component Value Date/Time   NA 129* 09/06/2011 0440   K 3.7 09/06/2011 0440   CL 97 09/06/2011 0440   CO2 27 09/06/2011 0440   GLUCOSE 123* 09/06/2011 0440   BUN 14 09/06/2011 0440   CREATININE 0.78 09/06/2011 0440   CREATININE 1.38* 08/26/2011 1222   CALCIUM 7.3* 09/06/2011 0440   PROT 5.4* 09/05/2011 0500   ALBUMIN 1.6* 09/05/2011 0500   AST 165* 09/05/2011 0500   ALT 93* 09/05/2011 0500   ALKPHOS 99 09/05/2011 0500   BILITOT 0.2* 09/05/2011 0500   GFRNONAA >90 09/06/2011 0440   GFRAA >90 09/06/2011 0440   Lipase     Component Value Date/Time   LIPASE 59 08/27/2011 1136       Studies/Results: No results found.  Anti-infectives: Anti-infectives     Start     Dose/Rate Route Frequency Ordered Stop   09/01/11 1400   metroNIDAZOLE (FLAGYL) tablet 500 mg        500  mg Oral 3 times per day 09/01/11 0749     08/28/11 1400   metroNIDAZOLE (FLAGYL) tablet 250 mg  Status:  Discontinued        250 mg Oral 3 times per day 08/28/11 1318 09/01/11 0749   08/27/11 1600   ertapenem (INVANZ) 1 g in sodium chloride 0.9 % 50 mL IVPB        1 g 100 mL/hr over 30 Minutes Intravenous Every 24 hours 08/27/11 1534     08/27/11 1500   piperacillin-tazobactam (ZOSYN) IVPB 3.375 g  Status:  Discontinued        3.375 g 12.5 mL/hr over 240 Minutes Intravenous  Once 08/27/11 1436 08/27/11 1553           Assessment/Plan  1. Diverticulitis with multiple abdominal abscesses, s/p perc drain 2. c diff colitis 3. PCM/TNA  Plan: 1. Will add yeast coverage to abx regimen 2. Will d/w MD if he feels comfortable switching from Invanz to Rocephin/Flagyl.  We may be able to actually switch him to oral abx soon. 3. Await CT scan results today to determine further plans.  Should be getting a drain injection of the left sided drain  today to confirm or rule out fistula to bowel. 4. Cont TNA for now until CT scan completed   LOS: 11 days    [name] 09/07/2011  ADDENDUM: Per pharmacy request, we will switch the patient from Invanz to Rocephin and continue the patient's flagyl. [name]

## 2011-09-07 NOTE — Progress Notes (Signed)
General Surgery [facility] Reviewed CT scan results.  Persistent leak from colon with free air and abscesses within peritoneal cavity.  Oral contrast leaks into peritoneal space.  Patient will require laparotomy and colostomy.  I have discussed this with him at length.  No other good options at this point.  Will look at OR schedule and plan exploration and resection next 1-2 days.  Patient understands and agrees to proceed. Velora Heckler, MD, [facility] Office: [phone]

## 2011-09-07 NOTE — Progress Notes (Signed)
Subjective: Pt ok. Feels good. Wants to go home.  Objective: Physical Exam: BP 103/68  Pulse 105  Temp 98.6 F (37 C) (Oral)  Resp 16  Ht 6\' 2"  (1.88 m)  Wt 165 lb 9.1 oz (75.1 kg)  BMI 21.26 kg/m2  SpO2 99% Drains intact Rt drain with scant output, flushed some purulent strands through tubing. Left drain intact, 150cc recorded yesterday, about 25cc in bulb now-- greenish feculent output  Labs: CBC  Basename 09/05/11 0500  WBC 8.2  HGB 11.4*  HCT 33.6*  PLT 399   BMET  Basename 09/06/11 0440 09/05/11 0500  NA 129* 132*  K 3.7 4.1  CL 97 99  CO2 27 27  GLUCOSE 123* 134*  BUN 14 16  CREATININE 0.78 0.78  CALCIUM 7.3* 7.4*   LFT  Basename 09/05/11 0500  PROT 5.4*  ALBUMIN 1.6*  AST 165*  ALT 93*  ALKPHOS 99  BILITOT 0.2*  BILIDIR --  IBILI --  LIPASE --   PT/INR No results found for this basename: LABPROT:2,INR:2 in the last 72 hours   Studies/Results: Ct Abdomen Pelvis W Contrast  09/07/2011  *RADIOLOGY REPORT*  Clinical Data: Follow-up percutaneous drainage catheters and abdominal abscesses.  CT ABDOMEN AND PELVIS WITH CONTRAST  Technique:  Multidetector CT imaging of the abdomen and pelvis was performed following the standard protocol during bolus administration of intravenous contrast.  Contrast: OMNIPAQUE IOHEXOL 300 MG/ML  SOLN  Comparison: CT scan 09/02/2011 and 08/31/2011.  Findings: The lung bases are clear.  The solid abdominal organs are stable.  There is contrast throughout the small bowel and colon.  The right lower quadrant drainage catheter is in good position.  Near complete resolution of the right intrahepatic abscess.  The left sided drainage catheter is in stable position.  Moderate air around the catheter but no discrete abscess is identified.  Located just superior to the inflamed mid sigmoid colon is a persistent abscess containing fluid, air and a small amount of contrast.  Findings consistent with a persistent colonic leak which is  likely being drained by the lower central catheter.  Other scattered stable air collections are noted in the mesentery.  Persistent marked wall thickening and inflammation of the sigmoid colon with overall improved appearance.  IMPRESSION:  1.  Drainage catheters and good position.  Near complete resolution of the right infrahepatic abscess. 2.  Persistent air collections in the abdomen and pelvis with air surrounding the lower left sided pelvic drainage catheter. 3.  Persistent abscess located just superior to the sigmoid colon which also contains a small amount of oral contrast consistent with a persistent leak.   Original Report Authenticated By: P. Loralie Champagne, M.D.     Assessment/Plan: Perforated diverticulitis S/p perc drain X 2 CT reviewed, persistent abscess with contrast noted c/w ongoing leak. Likely communicating with left lower drain, given output. Persistent air collections including around left sided drain tract. Rt sided fluid collection nearly completely resolved, small collection remains. Cont drains. Plans per [facility]    LOS: 11 days    Brayton El PA-C 09/07/2011 11:54 AM

## 2011-09-07 NOTE — Progress Notes (Signed)
PARENTERAL NUTRITION CONSULT NOTE - FOLLOW UP  Pharmacy Consult for TNA   Indication: Prolonged Ileus  No Known Allergies  Patient Measurements: Height: 6\' 2"  (188 cm) Weight: 165 lb 9.1 oz (75.1 kg) IBW/kg (Calculated) : 82.2  Usual Weight: 78.5 kg 08/22/11  Vital Signs: Temp: 98.6 F (37 C) (08/21 0636) Temp src: Oral (08/21 0636) BP: 103/68 mmHg (08/21 0636) Pulse Rate: 105  (08/21 0636) Intake/Output from previous day: 08/20 0701 - 08/21 0700 In: 3548.5 [P.O.:1320; TPN:2218.5] Out: 150 [Drains:150] Intake/Output from this shift: Total I/O In: 240 [P.O.:240] Out: 20 [Drains:20]  Labs:  [facility] 09/05/11 0500  WBC 8.2  HGB 11.4*  HCT 33.6*  PLT 399  APTT --  INR --     Basename 09/06/11 0440 09/05/11 0500  NA 129* 132*  K 3.7 4.1  CL 97 99  CO2 27 27  GLUCOSE 123* 134*  BUN 14 16  CREATININE 0.78 0.78  LABCREA -- --  CREAT24HRUR -- --  CALCIUM 7.3* 7.4*  MG 1.9 2.0  PHOS 2.4 2.4  PROT -- 5.4*  ALBUMIN -- 1.6*  AST -- 165*  ALT -- 93*  ALKPHOS -- 99  BILITOT -- 0.2*  BILIDIR -- --  IBILI -- --  PREALBUMIN -- 8.7*  TRIG -- 172*  CHOLHDL -- --  CHOL -- 58   Estimated Creatinine Clearance: 108.2 ml/min (by C-G formula based on Cr of 0.78).    Basename 09/07/11 1153 09/07/11 0719 09/07/11 0355  GLUCAP 137* 125* 128*    Medications:  Scheduled:     . acidophilus  1 capsule Oral Daily  . enoxaparin (LOVENOX) injection  40 mg Subcutaneous Q24H  . ertapenem (INVANZ) IV  1 g Intravenous Q24H  . feeding supplement  1 Container Oral QPC supper  . fluconazole (DIFLUCAN) IV  100 mg Intravenous Q24H  . insulin aspart  0-9 Units Subcutaneous Q4H  . metoprolol succinate  50 mg Oral Daily  . metroNIDAZOLE  500 mg Oral Q8H   Infusions:     . TPN (CLINIMIX) +/- additives 80 mL/hr at 09/05/11 1746   And  . fat emulsion 250 mL (09/05/11 1746)  . TPN (CLINIMIX) +/- additives 80 mL/hr at 09/06/11 1756    Insulin Requirements in the past 24 hours:   6 units SSI/24h CBGs within goal 150mg /dl  Nutritional Goals:  Per RD recommendation (8/16): 1875-2250 kcal/day, 90-110 gm/day protein  Clinimix E 5/20 at a goal rate of 80 ml/hr + lipids at 10 ml/hr will provide: 96 g/day protein and 2169 Kcal/day MWF, 1689 Kcal/day STTHS (Avg. 1896 Kcal/day weekly).  Current Nutrition:  Clinimix E5/20 at 36ml/hr Clear liquids started 8/19  Assessment: 56 YOM admitted 08/31/11 with perforated diverticulitis, with possible new abscess, prolonged ileus. S/p percutaneous drains x 2.  Surgery recommended to start parenteral nutrition since patient will need to remain NPO for several days. Per RD note, pt reports not eating solid foods for 2 weeks. Double lumen PICC placed 8/16 pm, TNA started 8/17pm.    Labs: (From 8/20 and before, no labs today) Lytes: wnl except Na low - unable to change TNA content. Corrected Ca = 9.22 (wnl). Others WNL, watch for refeeding - no signs this far Renal: wnl Drain output/24h = LFTs: elevated, increased from previous CBGs: in goal range < 150 Triglycerides: 172 on 8/19 Prealbumin: 6.8 on 8/17, 8.7 on 8/19 IVF: TNA at 59ml/hr (no MIVF)  Plan:   1) Continue Clinimix E 5/20 at goal rate 45ml/hr  2) IV lipids, MVI, and TE on MWF only due to national shortage 3) Continue sensitive SSI q4h 4) TNA labs q Mon and Thurs. 5) Monitor diet advancement/tolerance and adjust TPN accordingly   Cortana Vanderford, Loma Messing PharmD Pager #: [phone] 1:35 PM 09/07/2011

## 2011-09-08 ENCOUNTER — Encounter (HOSPITAL_COMMUNITY): Payer: Self-pay

## 2011-09-08 ENCOUNTER — Encounter (HOSPITAL_COMMUNITY): Payer: Self-pay | Admitting: Anesthesiology

## 2011-09-08 ENCOUNTER — Inpatient Hospital Stay (HOSPITAL_COMMUNITY): Payer: Managed Care, Other (non HMO) | Admitting: Anesthesiology

## 2011-09-08 ENCOUNTER — Encounter (HOSPITAL_COMMUNITY): Admission: EM | Disposition: A | Discharge status: Home / Self Care | Payer: Self-pay | Source: Home / Self Care

## 2011-09-08 DIAGNOSIS — K651 Peritoneal abscess: Secondary | ICD-10-CM

## 2011-09-08 HISTORY — PX: APPENDECTOMY: SHX54

## 2011-09-08 HISTORY — PX: LAPAROTOMY: SHX154

## 2011-09-08 LAB — CBC
HCT: 34.4 % — ABNORMAL LOW (ref 39.0–52.0)
Hemoglobin: 11.6 g/dL — ABNORMAL LOW (ref 13.0–17.0)
MCH: 30.4 pg (ref 26.0–34.0)
MCV: 90.3 fL (ref 78.0–100.0)
Platelets: 417 10*3/uL — ABNORMAL HIGH (ref 150–400)
RBC: 3.81 MIL/uL — ABNORMAL LOW (ref 4.22–5.81)
WBC: 13.9 10*3/uL — ABNORMAL HIGH (ref 4.0–10.5)

## 2011-09-08 LAB — GLUCOSE, CAPILLARY
Glucose-Capillary: 124 mg/dL — ABNORMAL HIGH (ref 70–99)
Glucose-Capillary: 128 mg/dL — ABNORMAL HIGH (ref 70–99)
Glucose-Capillary: 229 mg/dL — ABNORMAL HIGH (ref 70–99)

## 2011-09-08 LAB — CREATININE, SERUM: GFR calc Af Amer: >90 mL/min (ref >90)

## 2011-09-08 LAB — COMPREHENSIVE METABOLIC PANEL
ALT: 233 U/L — ABNORMAL HIGH (ref 0–53)
AST: 272 U/L — ABNORMAL HIGH (ref 0–37)
Calcium: 7.7 mg/dL — ABNORMAL LOW (ref 8.4–10.5)
Creatinine, Ser: 0.88 mg/dL (ref 0.50–1.35)
GFR calc non Af Amer: >90 mL/min (ref >90)
Sodium: 129 mEq/L — ABNORMAL LOW (ref 135–145)
Total Protein: 5.7 g/dL — ABNORMAL LOW (ref 6.0–8.3)

## 2011-09-08 LAB — PHOSPHORUS: Phosphorus: 2.9 mg/dL (ref 2.3–4.6)

## 2011-09-08 SURGERY — LAPAROTOMY, EXPLORATORY
Anesthesia: General | Site: Abdomen | Wound class: Dirty or Infected

## 2011-09-08 MED ORDER — LACTATED RINGERS IV SOLN
INTRAVENOUS | Status: DC | PRN
  Administered 2011-09-08 (×2): via INTRAVENOUS

## 2011-09-08 MED ORDER — ONDANSETRON HCL 4 MG PO TABS: 4.0000 mg | ORAL_TABLET | Freq: Four times a day (QID) | ORAL | Status: DC | PRN

## 2011-09-08 MED ORDER — PHENYLEPHRINE HCL 10 MG/ML IJ SOLN
10.0000 mg | INTRAVENOUS | Status: DC | PRN
  Administered 2011-09-08: 40 ug/min via INTRAVENOUS

## 2011-09-08 MED ORDER — LABETALOL HCL 5 MG/ML IV SOLN
INTRAVENOUS | Status: DC | PRN
  Administered 2011-09-08: 1 mg via INTRAVENOUS

## 2011-09-08 MED ORDER — FLEET ENEMA 7-19 GM/118ML RE ENEM
1.0000 | ENEMA | Freq: Once | RECTAL | Status: AC
  Administered 2011-09-08: 1 via RECTAL
  Filled 2011-09-08: qty 1

## 2011-09-08 MED ORDER — ALTEPLASE 2 MG IJ SOLR: 2.0000 mg | Freq: Once | INTRAMUSCULAR | Status: DC

## 2011-09-08 MED ORDER — LACTATED RINGERS IV SOLN: INTRAVENOUS | Status: DC

## 2011-09-08 MED ORDER — PHENOL 1.4 % MT LIQD
1.0000 | OROMUCOSAL | Status: DC | PRN
  Filled 2011-09-08: qty 177

## 2011-09-08 MED ORDER — FENTANYL CITRATE 0.05 MG/ML IJ SOLN
INTRAMUSCULAR | Status: DC | PRN
  Administered 2011-09-08: 100 ug via INTRAVENOUS
  Administered 2011-09-08 (×2): 50 ug via INTRAVENOUS

## 2011-09-08 MED ORDER — ONDANSETRON HCL 4 MG/2ML IJ SOLN: 4.0000 mg | Freq: Four times a day (QID) | INTRAMUSCULAR | Status: DC | PRN

## 2011-09-08 MED ORDER — NEOSTIGMINE METHYLSULFATE 1 MG/ML IJ SOLN
INTRAMUSCULAR | Status: DC | PRN
  Administered 2011-09-08: 5 mg via INTRAVENOUS

## 2011-09-08 MED ORDER — ALTEPLASE 2 MG IJ SOLR
2.0000 mg | Freq: Once | INTRAMUSCULAR | Status: DC
  Filled 2011-09-08: qty 2

## 2011-09-08 MED ORDER — HYDROMORPHONE 0.3 MG/ML IV SOLN
INTRAVENOUS | Status: AC
  Administered 2011-09-08: 2.1 mg via INTRAVENOUS
  Filled 2011-09-08: qty 25

## 2011-09-08 MED ORDER — HYDROMORPHONE HCL PF 1 MG/ML IJ SOLN
INTRAMUSCULAR | Status: AC
  Filled 2011-09-08: qty 1

## 2011-09-08 MED ORDER — ENOXAPARIN SODIUM 40 MG/0.4ML ~~LOC~~ SOLN
40.0000 mg | SUBCUTANEOUS | Status: DC
  Administered 2011-09-09 – 2011-09-14 (×6): 40 mg via SUBCUTANEOUS
  Filled 2011-09-08 (×8): qty 0.4

## 2011-09-08 MED ORDER — GLYCOPYRROLATE 0.2 MG/ML IJ SOLN
INTRAMUSCULAR | Status: DC | PRN
  Administered 2011-09-08: .6 mg via INTRAVENOUS

## 2011-09-08 MED ORDER — KCL IN DEXTROSE-NACL 20-5-0.45 MEQ/L-%-% IV SOLN
INTRAVENOUS | Status: AC
  Filled 2011-09-08: qty 1000

## 2011-09-08 MED ORDER — SODIUM CHLORIDE 0.9 % IJ SOLN: 9.0000 mL | INTRAMUSCULAR | Status: DC | PRN

## 2011-09-08 MED ORDER — CLINIMIX E/DEXTROSE (5/20) 5 % IV SOLN
INTRAVENOUS | Status: AC
  Administered 2011-09-08: 18:00:00 via INTRAVENOUS
  Filled 2011-09-08: qty 2000

## 2011-09-08 MED ORDER — PHENYLEPHRINE HCL 10 MG/ML IJ SOLN
INTRAMUSCULAR | Status: DC | PRN
  Administered 2011-09-08: 80 ug via INTRAVENOUS
  Administered 2011-09-08 (×2): 40 ug via INTRAVENOUS

## 2011-09-08 MED ORDER — ROCURONIUM BROMIDE 100 MG/10ML IV SOLN
INTRAVENOUS | Status: DC | PRN
  Administered 2011-09-08: 10 mg via INTRAVENOUS
  Administered 2011-09-08: 15 mg via INTRAVENOUS
  Administered 2011-09-08: 45 mg via INTRAVENOUS

## 2011-09-08 MED ORDER — DIPHENHYDRAMINE HCL 50 MG/ML IJ SOLN: 12.5000 mg | Freq: Four times a day (QID) | INTRAMUSCULAR | Status: DC | PRN

## 2011-09-08 MED ORDER — HYDRALAZINE HCL 20 MG/ML IJ SOLN: 5.0000 mg | Freq: Once | INTRAMUSCULAR | Status: DC

## 2011-09-08 MED ORDER — HYDROMORPHONE HCL PF 1 MG/ML IJ SOLN
0.2500 mg | INTRAMUSCULAR | Status: DC | PRN
  Administered 2011-09-08 (×4): 0.5 mg via INTRAVENOUS

## 2011-09-08 MED ORDER — DEXAMETHASONE SODIUM PHOSPHATE 10 MG/ML IJ SOLN
INTRAMUSCULAR | Status: DC | PRN
  Administered 2011-09-08: 10 mg via INTRAVENOUS

## 2011-09-08 MED ORDER — ONDANSETRON HCL 4 MG/2ML IJ SOLN
INTRAMUSCULAR | Status: DC | PRN
  Administered 2011-09-08: 4 mg via INTRAVENOUS

## 2011-09-08 MED ORDER — SODIUM CHLORIDE 0.9 % IR SOLN
Status: DC | PRN
  Administered 2011-09-08: 3000 mL

## 2011-09-08 MED ORDER — MEPERIDINE HCL 50 MG/ML IJ SOLN: 6.2500 mg | INTRAMUSCULAR | Status: DC | PRN

## 2011-09-08 MED ORDER — EPHEDRINE SULFATE 50 MG/ML IJ SOLN
INTRAMUSCULAR | Status: DC | PRN
  Administered 2011-09-08 (×4): 10 mg via INTRAVENOUS

## 2011-09-08 MED ORDER — MENTHOL 3 MG MT LOZG
1.0000 | LOZENGE | OROMUCOSAL | Status: DC | PRN
  Filled 2011-09-08: qty 9

## 2011-09-08 MED ORDER — HYDROMORPHONE 0.3 MG/ML IV SOLN
INTRAVENOUS | Status: DC
  Administered 2011-09-08: 0.3 mg via INTRAVENOUS
  Administered 2011-09-09: 0.9 mg via INTRAVENOUS
  Administered 2011-09-09: 1.5 mg via INTRAVENOUS
  Administered 2011-09-09: 1.2 mg via INTRAVENOUS
  Administered 2011-09-09: 1.3 mg via INTRAVENOUS
  Administered 2011-09-09: 12 mg via INTRAVENOUS
  Administered 2011-09-10: 0.6 mg via INTRAVENOUS
  Administered 2011-09-10: 1.2 mg via INTRAVENOUS
  Administered 2011-09-10 (×3): 0.6 mg via INTRAVENOUS
  Administered 2011-09-11: 0.3 mg via INTRAVENOUS
  Administered 2011-09-11 (×3): 0.6 mg via INTRAVENOUS
  Administered 2011-09-11: 1.5 mg via INTRAVENOUS
  Administered 2011-09-12 (×2): 1.2 mg via INTRAVENOUS
  Administered 2011-09-12: 05:00:00 via INTRAVENOUS
  Administered 2011-09-12: 0.3 mg via INTRAVENOUS
  Filled 2011-09-08 (×3): qty 25

## 2011-09-08 MED ORDER — KCL IN DEXTROSE-NACL 20-5-0.45 MEQ/L-%-% IV SOLN
INTRAVENOUS | Status: DC
  Filled 2011-09-08 (×2): qty 1000

## 2011-09-08 MED ORDER — NALOXONE HCL 0.4 MG/ML IJ SOLN: 0.4000 mg | INTRAMUSCULAR | Status: DC | PRN

## 2011-09-08 MED ORDER — DIPHENHYDRAMINE HCL 12.5 MG/5ML PO ELIX: 12.5000 mg | ORAL_SOLUTION | Freq: Four times a day (QID) | ORAL | Status: DC | PRN

## 2011-09-08 MED ORDER — LACTATED RINGERS IV BOLUS (SEPSIS)
1000.0000 mL | Freq: Once | INTRAVENOUS | Status: DC
  Administered 2011-09-08: 1000 mL via INTRAVENOUS

## 2011-09-08 MED ORDER — MIDAZOLAM HCL 5 MG/5ML IJ SOLN
INTRAMUSCULAR | Status: DC | PRN
  Administered 2011-09-08: 2 mg via INTRAVENOUS

## 2011-09-08 MED ORDER — PROPOFOL 10 MG/ML IV EMUL
INTRAVENOUS | Status: DC | PRN
  Administered 2011-09-08: 140 mg via INTRAVENOUS

## 2011-09-08 MED ORDER — PROMETHAZINE HCL 25 MG/ML IJ SOLN: 6.2500 mg | INTRAMUSCULAR | Status: DC | PRN

## 2011-09-08 SURGICAL SUPPLY — 45 items
APPLICATOR COTTON TIP 6IN STRL (MISCELLANEOUS) ×2 IMPLANT
BLADE EXTENDED COATED 6.5IN (ELECTRODE) ×1 IMPLANT
BLADE HEX COATED 2.75 (ELECTRODE) ×2 IMPLANT
CANISTER SUCTION 2500CC (MISCELLANEOUS) ×2 IMPLANT
CLAMP POUCH DRAINAGE QUIET (OSTOMY) ×1 IMPLANT
CLOTH BEACON ORANGE TIMEOUT ST (SAFETY) ×2 IMPLANT
COVER MAYO STAND STRL (DRAPES) ×1 IMPLANT
DRAPE LAPAROSCOPIC ABDOMINAL (DRAPES) ×2 IMPLANT
DRAPE WARM FLUID 44X44 (DRAPE) ×1 IMPLANT
DRSG PAD ABDOMINAL 8X10 ST (GAUZE/BANDAGES/DRESSINGS) ×1 IMPLANT
ELECT REM PT RETURN 9FT ADLT (ELECTROSURGICAL) ×2
ELECTRODE REM PT RTRN 9FT ADLT (ELECTROSURGICAL) ×1 IMPLANT
GLOVE BIOGEL PI IND STRL 7.0 (GLOVE) ×1 IMPLANT
GLOVE BIOGEL PI INDICATOR 7.0 (GLOVE) ×1
GLOVE SURG ORTHO 8.0 STRL STRW (GLOVE) ×2 IMPLANT
GOWN STRL NON-REIN LRG LVL3 (GOWN DISPOSABLE) ×2 IMPLANT
GOWN STRL REIN XL XLG (GOWN DISPOSABLE) ×4 IMPLANT
KIT BASIN OR (CUSTOM PROCEDURE TRAY) ×2 IMPLANT
LIGASURE IMPACT 36 18CM CVD LR (INSTRUMENTS) ×1 IMPLANT
NS IRRIG 1000ML POUR BTL (IV SOLUTION) ×6 IMPLANT
PACK GENERAL/GYN (CUSTOM PROCEDURE TRAY) ×2 IMPLANT
POUCH OSTOMY 2 3/4  H 3804 (WOUND CARE) ×1
POUCH OSTOMY 2 3/4 H 3804 (WOUND CARE) ×1
POUCH OSTOMY 2 PC DRNBL 2.75 (WOUND CARE) IMPLANT
SPONGE GAUZE 4X4 12PLY (GAUZE/BANDAGES/DRESSINGS) ×2 IMPLANT
SPONGE LAP 18X18 X RAY DECT (DISPOSABLE) ×2 IMPLANT
STAPLER CUT CVD 40MM GREEN (STAPLE) ×1 IMPLANT
STAPLER PROXIMATE 75MM BLUE (STAPLE) ×1 IMPLANT
STAPLER VISISTAT 35W (STAPLE) ×2 IMPLANT
SUCTION POOLE TIP (SUCTIONS) ×1 IMPLANT
SUT NOV 1 T60/GS (SUTURE) ×5 IMPLANT
SUT SILK 2 0 (SUTURE) ×2
SUT SILK 2 0 SH CR/8 (SUTURE) ×1 IMPLANT
SUT SILK 2-0 18XBRD TIE 12 (SUTURE) IMPLANT
SUT SILK 3 0 (SUTURE) ×2
SUT SILK 3 0 SH CR/8 (SUTURE) ×1 IMPLANT
SUT SILK 3-0 18XBRD TIE 12 (SUTURE) IMPLANT
SUT VIC AB 3-0 SH 8-18 (SUTURE) ×1 IMPLANT
SUT VICRYL 2 0 18  UND BR (SUTURE) ×1
SUT VICRYL 2 0 18 UND BR (SUTURE) IMPLANT
TAPE CLOTH SURG 4X10 WHT LF (GAUZE/BANDAGES/DRESSINGS) ×1 IMPLANT
TOWEL OR 17X26 10 PK STRL BLUE (TOWEL DISPOSABLE) ×4 IMPLANT
TRAY FOLEY CATH 14FRSI W/METER (CATHETERS) ×1 IMPLANT
WATER STERILE IRR 1500ML POUR (IV SOLUTION) ×1 IMPLANT
YANKAUER SUCT BULB TIP NO VENT (SUCTIONS) ×1 IMPLANT

## 2011-09-08 NOTE — Transfer of Care (Signed)
Immediate Anesthesia Transfer of Care Note  Patient: [name]  Procedure(s) Performed: Procedure(s) (LRB): EXPLORATORY LAPAROTOMY (N/A) APPENDECTOMY (N/A)  Patient Location: PACU  Anesthesia Type: General  Level of Consciousness: awake, sedated and patient cooperative  Airway & Oxygen Therapy: Patient Spontanous Breathing and Patient connected to face mask oxygen  Post-op Assessment: Report given to PACU RN and Post -op Vital signs reviewed and stable  Post vital signs: Reviewed and stable  Complications: No apparent anesthesia complications

## 2011-09-08 NOTE — Anesthesia Postprocedure Evaluation (Signed)
  Anesthesia Post-op Note  Patient: [name]  Procedure(s) Performed: Procedure(s) (LRB): EXPLORATORY LAPAROTOMY (N/A) APPENDECTOMY (N/A)  Patient Location: PACU  Anesthesia Type: General  Level of Consciousness: awake and alert   Airway and Oxygen Therapy: Patient Spontanous Breathing  Post-op Pain: mild  Post-op Assessment: Post-op Vital signs reviewed, Patient's Cardiovascular Status Stable, Respiratory Function Stable, Patent Airway and No signs of Nausea or vomiting  Post-op Vital Signs: stable  Complications: No apparent anesthesia complications

## 2011-09-08 NOTE — Progress Notes (Signed)
PARENTERAL NUTRITION CONSULT NOTE - FOLLOW UP  Pharmacy Consult for TNA   Indication: Prolonged Ileus  No Known Allergies  Patient Measurements: Height: 6\' 2"  (188 cm) Weight: 165 lb 9.1 oz (75.1 kg) IBW/kg (Calculated) : 82.2  Usual Weight: 78.5 kg 08/22/11  Vital Signs: Temp: 99.1 F (37.3 C) (08/22 0539) Temp src: Oral (08/22 0539) BP: 101/66 mmHg (08/22 0539) Pulse Rate: 98  (08/22 0539) Intake/Output from previous day: 08/21 0701 - 08/22 0700 In: 840 [P.O.:840] Out: 80 [Drains:80] Intake/Output from this shift:    Labs: No results found for this basename: WBC:3,HGB:3,HCT:3,PLT:3,APTT:3,INR:3 in the last 72 hours   Basename 09/08/11 0631 09/06/11 0440  NA 129* 129*  K 4.4 3.7  CL 97 97  CO2 29 27  GLUCOSE 120* 123*  BUN 12 14  CREATININE 0.88 0.78  LABCREA -- --  CREAT24HRUR -- --  CALCIUM 7.7* 7.3*  MG 2.2 1.9  PHOS 2.9 2.4  PROT 5.7* --  ALBUMIN 1.8* --  AST 272* --  ALT 233* --  ALKPHOS 93 --  BILITOT 0.2* --  BILIDIR -- --  IBILI -- --  PREALBUMIN -- --  TRIG -- --  CHOLHDL -- --  CHOL -- --  Corrected calcium: 9.5  Estimated Creatinine Clearance: 98.4 ml/min (by C-G formula based on Cr of 0.88).     Medications:  Scheduled:     . acidophilus  1 capsule Oral Daily  . alteplase  2 mg Intracatheter Once  . alteplase  2 mg Intracatheter Once  . cefTRIAXone (ROCEPHIN)  IV  1 g Intravenous Q24H  . enoxaparin (LOVENOX) injection  40 mg Subcutaneous Q24H  . feeding supplement  1 Container Oral QPC supper  . fluconazole (DIFLUCAN) IV  100 mg Intravenous Q24H  . insulin aspart  0-9 Units Subcutaneous Q4H  . metoprolol succinate  50 mg Oral Daily  . metroNIDAZOLE  500 mg Oral Q8H  . sodium phosphate  1 enema Rectal Once  . DISCONTD: ertapenem (INVANZ) IV  1 g Intravenous Q24H  . DISCONTD: hydrALAZINE  5 mg Intravenous Once   Infusions:     . fat emulsion 240 mL (09/07/11 1717)  . TPN (CLINIMIX) +/- additives 80 mL/hr at 09/06/11 1756  .  TPN (CLINIMIX) +/- additives 80 mL/hr at 09/07/11 1717    CBGs and Insulin Requirements in past 24 hours:  CBGs 113 - 137 4 units Novolog SSI used (on sensitive-scale coverage)  Nutritional Goals:  Per RD recommendation (8/16): 1875-2250 kcal/day, 90-110 gm/day protein  Clinimix-E 5/20 at a goal rate of 80 ml/hr + lipids at 10 ml/hr provides: 96 g/day protein and 2169 Kcal/day MWF, 1689 Kcal/day STTHS (Avg. 1896 Kcal/day weekly).  Current Nutrition:  Clinimix-E 5/20 at 80 mL/hr  Lipids 20% at 10 mL/hr MWF only No maintenance IVF NPO except sips with meds  Labs:  Lytes: WNL except Na low 129.  Unable to alter electrolyte content of premixed Clinimix-E formulas Renal: SCr and BUN WNL LFTs: AST and ALT continue to rise.   These were elevated before TNA began.  CBGs: in goal range 100--150 Triglycerides: 172 on 8/19 Prealbumin: 8.7 on 8/19,  Improved from 6.8 on 8/17.   Assessment:  56 y/o M admitted 08/31/11 with perforated diverticulitis, possible new abscess, prolonged ileus. TNA was started 8/17.   CT reportedly shows persistent leak from colon with free air and abscesses in peritoneal cavity.  Plans noted for exploration, resection, colostomy - awaiting word on timing of surgery.  Tolerating  TNA at goal rate.  CBGs well controlled and requiring minimal insulin.  Could consider reducing coverage to q6h, but anticipate transient increase in insulin requirements post-operatively.  Transaminases continue to rise - question etiology and whether TNA could be exacerbating.  (These were elevated before TNA began).  Alk phos and bilirubin remain WNL or lower. . Plan:   1) Continue Clinimix-E 5/20 at goal rate of 63ml/hr.  Recheck LFTs in AM.  2) IV lipids, MVI, and TE on MWF only due to national shortage 3) Continue Novolog SSI q4h (sensitive scale coverage). 4) Await MD assessment of elevated LFTs.  If TNA is contributory could try lower dextrose concentration +/- cycling.  Hope Budds PharmD, BCPS Pager #: [phone] 8:17 AM 09/08/2011

## 2011-09-08 NOTE — Progress Notes (Signed)
Dr. Acey Lav, anesthes., notified of elevated heart rate 120, BP98/66; order rec'd and IV fluid rate increased

## 2011-09-08 NOTE — Interval H&P Note (Signed)
History and Physical Interval Note:  09/08/2011 10:11 AM  [name]  has presented today for surgery, with the diagnosis of perforated sigmoid diverticulitis.  The various methods of treatment have been discussed with the patient and family. After consideration of risks, benefits and other options for treatment, the patient has consented to    Procedure(s) (LRB): EXPLORATORY LAPAROTOMY, colonic resection, descending colostomy as a surgical intervention .    The patient's history has been reviewed, patient examined, no change in status, stable for surgery.  I have reviewed the patient's chart and labs.  Questions were answered to the patient's satisfaction.    Velora Heckler, MD, [facility] Office: [phone]    [name]

## 2011-09-08 NOTE — Brief Op Note (Signed)
08/27/2011 - 09/08/2011  12:32 PM  PATIENT:  [name]  56 y.o. male  PRE-OPERATIVE DIAGNOSIS:  perforated sigmoid diverticulitis with intra-abdominal abscesses  POST-OPERATIVE DIAGNOSIS:  same  PROCEDURE:  1.  Exploratory laparotomy  2. Low anterior resection of sigmoid colon  3. Descending colostomy  4. Incidental appendectomy  SURGEON:  Velora Heckler, MD, FACS  ANESTHESIA:   general  EBL:  Total I/O In: 1000 [I.V.:1000] Out: 425 [Urine:125; Drains:50; Blood:250]  BLOOD ADMINISTERED:none  DRAINS: none   LOCAL MEDICATIONS USED:  NONE  SPECIMEN:  Excision  DISPOSITION OF SPECIMEN:  PATHOLOGY  COUNTS:  YES  TOURNIQUET:  * No tourniquets in log *  DICTATION: .Other Dictation: Dictation Number [id number]  PLAN OF CARE: Admit to inpatient   PATIENT DISPOSITION:  PACU - hemodynamically stable.   Delay start of Pharmacological VTE agent (>24hrs) due to surgical blood loss or risk of bleeding: yes  Velora Heckler, MD, [facility] Office: [phone]

## 2011-09-08 NOTE — Anesthesia Preprocedure Evaluation (Signed)
Anesthesia Evaluation  Patient identified by MRN, date of birth, ID band Patient awake    Reviewed: Allergy & Precautions, H&P , NPO status , Patient's Chart, lab work & pertinent test results  Airway Mallampati: II TM Distance: >3 FB Neck ROM: Full    Dental No notable dental hx.    Pulmonary neg pulmonary ROS,  breath sounds clear to auscultation  Pulmonary exam normal       Cardiovascular hypertension, Pt. on medications - CAD negative cardio ROS  Valvular problems/murmurs: s/p mitral valve repair  Rhythm:Regular Rate:Normal     Neuro/Psych negative neurological ROS  negative psych ROS   GI/Hepatic negative GI ROS, Neg liver ROS,   Endo/Other  negative endocrine ROS  Renal/GU negative Renal ROS  negative genitourinary   Musculoskeletal negative musculoskeletal ROS (+)   Abdominal   Peds negative pediatric ROS (+)  Hematology negative hematology ROS (+)   Anesthesia Other Findings   Reproductive/Obstetrics negative OB ROS                           Anesthesia Physical Anesthesia Plan  ASA: II  Anesthesia Plan: General   Post-op Pain Management:    Induction: Intravenous  Airway Management Planned: Oral ETT  Additional Equipment:   Intra-op Plan:   Post-operative Plan: Extubation in OR  Informed Consent: I have reviewed the patients History and Physical, chart, labs and discussed the procedure including the risks, benefits and alternatives for the proposed anesthesia with the patient or authorized representative who has indicated his/her understanding and acceptance.   Dental advisory given  Plan Discussed with: CRNA  Anesthesia Plan Comments:         Anesthesia Quick Evaluation

## 2011-09-09 ENCOUNTER — Encounter (HOSPITAL_COMMUNITY): Payer: Self-pay | Admitting: Surgery

## 2011-09-09 LAB — CBC
HCT: 33 % — ABNORMAL LOW (ref 39.0–52.0)
Hemoglobin: 11.1 g/dL — ABNORMAL LOW (ref 13.0–17.0)
MCH: 30.5 pg (ref 26.0–34.0)
MCHC: 33.6 g/dL (ref 30.0–36.0)
MCV: 90.7 fL (ref 78.0–100.0)
Platelets: 336 10*3/uL (ref 150–400)
RBC: 3.64 MIL/uL — ABNORMAL LOW (ref 4.22–5.81)
RDW: 13.8 % (ref 11.5–15.5)
WBC: 15.3 10*3/uL — ABNORMAL HIGH (ref 4.0–10.5)

## 2011-09-09 LAB — COMPREHENSIVE METABOLIC PANEL
ALT: 179 U/L — ABNORMAL HIGH (ref 0–53)
AST: 127 U/L — ABNORMAL HIGH (ref 0–37)
Albumin: 1.6 g/dL — ABNORMAL LOW (ref 3.5–5.2)
Alkaline Phosphatase: 81 U/L (ref 39–117)
BUN: 12 mg/dL (ref 6–23)
CO2: 28 mEq/L (ref 19–32)
Calcium: 7.6 mg/dL — ABNORMAL LOW (ref 8.4–10.5)
Chloride: 97 mEq/L (ref 96–112)
Creatinine, Ser: 0.8 mg/dL (ref 0.50–1.35)
GFR calc Af Amer: >90 mL/min (ref >90)
GFR calc non Af Amer: >90 mL/min (ref >90)
Glucose, Bld: 149 mg/dL — ABNORMAL HIGH (ref 70–99)
Potassium: 4.6 mEq/L (ref 3.5–5.1)
Sodium: 129 mEq/L — ABNORMAL LOW (ref 135–145)
Total Bilirubin: 0.3 mg/dL (ref 0.3–1.2)
Total Protein: 5.5 g/dL — ABNORMAL LOW (ref 6.0–8.3)

## 2011-09-09 LAB — GLUCOSE, CAPILLARY
Glucose-Capillary: 126 mg/dL — ABNORMAL HIGH (ref 70–99)
Glucose-Capillary: 158 mg/dL — ABNORMAL HIGH (ref 70–99)
Glucose-Capillary: 165 mg/dL — ABNORMAL HIGH (ref 70–99)

## 2011-09-09 MED ORDER — FAT EMULSION 20 % IV EMUL
240.0000 mL | INTRAVENOUS | Status: AC
  Administered 2011-09-09: 240 mL via INTRAVENOUS
  Filled 2011-09-09: qty 250

## 2011-09-09 MED ORDER — INSULIN ASPART 100 UNIT/ML ~~LOC~~ SOLN
0.0000 [IU] | SUBCUTANEOUS | Status: DC
  Administered 2011-09-09: 3 [IU] via SUBCUTANEOUS
  Administered 2011-09-09 – 2011-09-14 (×27): 2 [IU] via SUBCUTANEOUS

## 2011-09-09 MED ORDER — ZINC TRACE METAL 1 MG/ML IV SOLN
INTRAVENOUS | Status: DC
  Filled 2011-09-09: qty 2000

## 2011-09-09 MED ORDER — METRONIDAZOLE IN NACL 5-0.79 MG/ML-% IV SOLN
500.0000 mg | Freq: Three times a day (TID) | INTRAVENOUS | Status: DC
  Administered 2011-09-09 – 2011-09-12 (×9): 500 mg via INTRAVENOUS
  Filled 2011-09-09 (×13): qty 100

## 2011-09-09 MED ORDER — METRONIDAZOLE 500 MG PO TABS
500.0000 mg | ORAL_TABLET | Freq: Three times a day (TID) | ORAL | Status: DC
  Filled 2011-09-09 (×3): qty 1

## 2011-09-09 MED ORDER — ZINC TRACE METAL 1 MG/ML IV SOLN
INTRAVENOUS | Status: AC
  Administered 2011-09-09: 17:00:00 via INTRAVENOUS
  Filled 2011-09-09: qty 2000

## 2011-09-09 MED ORDER — POTASSIUM CHLORIDE 2 MEQ/ML IV SOLN
INTRAVENOUS | Status: DC
  Administered 2011-09-09 – 2011-09-15 (×5): via INTRAVENOUS
  Filled 2011-09-09 (×9): qty 1000

## 2011-09-09 MED ORDER — METOPROLOL TARTRATE 1 MG/ML IV SOLN
5.0000 mg | Freq: Four times a day (QID) | INTRAVENOUS | Status: DC
  Administered 2011-09-09 – 2011-09-15 (×10): 5 mg via INTRAVENOUS
  Filled 2011-09-09 (×30): qty 5

## 2011-09-09 NOTE — Progress Notes (Signed)
Nutrition Follow-up  Intervention: TNA per pharmacy. New colostomy education provided. Will monitor.   Diet Order:  NPO  TNA: Clinimix E 5/20 @ 80 ml/hr.  Lipids (20% IVFE @ 10 ml/hr), multivitamins, and trace elements are provided 3 times weekly (MWF) due to national backorder.  Provides 1896 kcal and 96 grams protein daily (based on weekly average).  Meets 101% minimum estimated kcal and 107% minimum estimated protein needs.  Additional IVF with D5 NS @ 50 ml/hr.  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 Pt with NGT in place with some minimal brown drainage during visit. No colostomy output recorded yet. Pt c/o some soreness form surgery.   CT of abdomen/pelvis on 09/07/11 showed: 1. Drainage catheters and good position. Near complete resolution of the right infrahepatic abscess.  2. Persistent air collections in the abdomen and pelvis with air surrounding the lower left sided pelvic drainage catheter.  3. Persistent abscess located just superior to the sigmoid colon which also contains a small amount of oral contrast consistent with a persistent leak.   Meds: Scheduled Meds:   . cefTRIAXone (ROCEPHIN)  IV  1 g Intravenous Q24H  . dextrose 5 % and 0.45 % NaCl with KCl 20 mEq/L      . enoxaparin (LOVENOX) injection  40 mg Subcutaneous Q24H  . fluconazole (DIFLUCAN) IV  100 mg Intravenous Q24H  . HYDROmorphone      . HYDROmorphone PCA 0.3 mg/mL   Intravenous Q4H  . insulin aspart  0-15 Units Subcutaneous Q4H  . metoprolol  5 mg Intravenous Q6H  . metroNIDAZOLE  500 mg Oral Q8H  . DISCONTD: acidophilus  1 capsule Oral Daily  . DISCONTD: alteplase  2 mg Intracatheter Once  . DISCONTD: alteplase  2 mg Intracatheter Once  . DISCONTD: enoxaparin (LOVENOX) injection  40 mg Subcutaneous Q24H  . DISCONTD: feeding supplement  1 Container Oral QPC supper  . DISCONTD: insulin aspart  0-9 Units Subcutaneous Q4H  . DISCONTD: lactated ringers  1,000  mL Intravenous Once  . DISCONTD: metoprolol succinate  50 mg Oral Daily  . DISCONTD: metroNIDAZOLE  500 mg Oral Q8H   Continuous Infusions:   . dextrose 5 % and 0.9% NaCl 1,000 mL with potassium chloride 20 mEq infusion    . fat emulsion Stopped (09/08/11 0951)  . fat emulsion    . lactated ringers    . TPN [facility]) +/- additives Stopped (09/08/11 0951)  . TPN (CLINIMIX) +/- additives 80 mL/hr at 09/08/11 1731  . TPN (CLINIMIX) +/- additives    . DISCONTD: dextrose 5 % and 0.45 % NaCl with KCl 20 mEq/L    . DISCONTD: lactated ringers    . DISCONTD: lactated ringers     PRN Meds:.diphenhydrAMINE, diphenhydrAMINE, menthol-cetylpyridinium, morphine injection, naloxone, ondansetron (ZOFRAN) IV, ondansetron, phenol, sodium chloride, sodium chloride, sodium chloride, DISCONTD:  HYDROmorphone (DILAUDID) injection, DISCONTD: meperidine (DEMEROL) injection, DISCONTD: ondansetron, DISCONTD: ondansetron (ZOFRAN) IV, DISCONTD: promethazine, DISCONTD: sodium chloride irrigation  Labs:  CMP     Component Value Date/Time   NA 129* 09/09/2011 0315   K 4.6 09/09/2011 0315   CL 97 09/09/2011 0315   CO2 28 09/09/2011 0315   GLUCOSE 149* 09/09/2011 0315   BUN 12 09/09/2011 0315   CREATININE 0.80 09/09/2011 0315   CREATININE 1.38* 08/26/2011 1222   CALCIUM 7.6* 09/09/2011 0315   PROT 5.5* 09/09/2011 0315   ALBUMIN 1.6* 09/09/2011 0315   AST 127* 09/09/2011 0315   ALT 179* 09/09/2011 0315  ALKPHOS 81 09/09/2011 0315   BILITOT 0.3 09/09/2011 0315   GFRNONAA >90 09/09/2011 0315   GFRAA >90 09/09/2011 0315   Prealbumin  Date Value Range Status  09/05/2011 8.7* 17.0 - 34.0 mg/dL Final   CBG (last 3)   Basename 09/09/11 0810 09/09/11 0427 09/09/11 0027  GLUCAP 142* 165* 158*   - Pt with persistent low sodium  - AST/ALT elevated but trending down - CBGs slightly elevated    Intake/Output Summary (Last 24 hours) at 09/09/11 1050 Last data filed at 09/09/11 0932  Gross per 24 hour  Intake   2300 ml    Output   2125 ml  Net    175 ml    Weight Status:  No new weights   Re-estimated needs:   1610-9604 calories 110-125g protein  Nutrition Dx: Inadequate oral intake - ongoing  Goal: Diet advancement - not met  New goal: Pt to consume >90% of estimated nutritional needs.   Monitor: Weights, labs, colostomy and NGT output, diet advancement  Dietitian# [phone]

## 2011-09-09 NOTE — Progress Notes (Signed)
General Surgery [facility] Patient seen and examined.  Moderate pain.  Stoma viable.  Dressing intact - will begin wet to dry dressing changes in AM 6/24.  Stoma teaching to begin - ostomy nurse consult. Velora Heckler, MD, [facility] Office: [phone]

## 2011-09-09 NOTE — Progress Notes (Addendum)
Patient ID: VELDON WAGER, male   DOB: May 07, 1955, 56 y.o.   MRN: [id number] 1 Day Post-Op  Subjective: Pt feels ok, c/o pain with movement.  Objective: Vital signs in last 24 hours: Temp:  [97.5 F (36.4 C)-98.7 F (37.1 C)] 98.1 F (36.7 C) (08/23 0600) Pulse Rate:  [110-122] 119  (08/23 0600) Resp:  [14-22] 20  (08/23 0912) BP: (97-125)/(65-80) 116/80 mmHg (08/23 0600) SpO2:  [98 %-100 %] 100 % (08/23 0912) Last BM Date: 09/08/11  Intake/Output from previous day: 08/22 0701 - 08/23 0700 In: 2300 [I.V.:2300] Out: 2175 [Urine:1875; Drains:50; Blood:250] Intake/Output this shift:    PE: Abd: soft, tender, incision is intact with packing still in place.  Stoma is pink and health.  No air or stool in bag.  Lab Results:   Basename 09/09/11 0315 09/08/11 1540  WBC 15.3* 13.9*  HGB 11.1* 11.6*  HCT 33.0* 34.4*  PLT 336 417*   BMET  Basename 09/09/11 0315 09/08/11 1540 09/08/11 0631  NA 129* -- 129*  K 4.6 -- 4.4  CL 97 -- 97  CO2 28 -- 29  GLUCOSE 149* -- 120*  BUN 12 -- 12  CREATININE 0.80 0.85 --  CALCIUM 7.6* -- 7.7*   PT/INR No results found for this basename: LABPROT:2,INR:2 in the last 72 hours CMP     Component Value Date/Time   NA 129* 09/09/2011 0315   K 4.6 09/09/2011 0315   CL 97 09/09/2011 0315   CO2 28 09/09/2011 0315   GLUCOSE 149* 09/09/2011 0315   BUN 12 09/09/2011 0315   CREATININE 0.80 09/09/2011 0315   CREATININE 1.38* 08/26/2011 1222   CALCIUM 7.6* 09/09/2011 0315   PROT 5.5* 09/09/2011 0315   ALBUMIN 1.6* 09/09/2011 0315   AST 127* 09/09/2011 0315   ALT 179* 09/09/2011 0315   ALKPHOS 81 09/09/2011 0315   BILITOT 0.3 09/09/2011 0315   GFRNONAA >90 09/09/2011 0315   GFRAA >90 09/09/2011 0315   Lipase     Component Value Date/Time   LIPASE 59 08/27/2011 1136       Studies/Results: Ct Abdomen Pelvis W Contrast  09/07/2011  *RADIOLOGY REPORT*  Clinical Data: Follow-up percutaneous drainage catheters and abdominal abscesses.  CT ABDOMEN AND PELVIS  WITH CONTRAST  Technique:  Multidetector CT imaging of the abdomen and pelvis was performed following the standard protocol during bolus administration of intravenous contrast.  Contrast: OMNIPAQUE IOHEXOL 300 MG/ML  SOLN  Comparison: CT scan 09/02/2011 and 08/31/2011.  Findings: The lung bases are clear.  The solid abdominal organs are stable.  There is contrast throughout the small bowel and colon.  The right lower quadrant drainage catheter is in good position.  Near complete resolution of the right intrahepatic abscess.  The left sided drainage catheter is in stable position.  Moderate air around the catheter but no discrete abscess is identified.  Located just superior to the inflamed mid sigmoid colon is a persistent abscess containing fluid, air and a small amount of contrast.  Findings consistent with a persistent colonic leak which is likely being drained by the lower central catheter.  Other scattered stable air collections are noted in the mesentery.  Persistent marked wall thickening and inflammation of the sigmoid colon with overall improved appearance.  IMPRESSION:  1.  Drainage catheters and good position.  Near complete resolution of the right infrahepatic abscess. 2.  Persistent air collections in the abdomen and pelvis with air surrounding the lower left sided pelvic drainage catheter. 3.  Persistent abscess located just superior to the sigmoid colon which also contains a small amount of oral contrast consistent with a persistent leak.   Original Report Authenticated By: P. Loralie Champagne, M.D.     Anti-infectives: Anti-infectives     Start     Dose/Rate Route Frequency Ordered Stop   09/09/11 1400   metroNIDAZOLE (FLAGYL) tablet 500 mg     Comments: Clamp NGT for 1 hr after giving flagyl      500 mg Oral 3 times per day 09/09/11 0929     09/07/11 1600   cefTRIAXone (ROCEPHIN) 1 g in dextrose 5 % 50 mL IVPB        1 g 100 mL/hr over 30 Minutes Intravenous Every 24 hours 09/07/11  1444     09/07/11 1000   fluconazole (DIFLUCAN) IVPB 100 mg        100 mg 50 mL/hr over 60 Minutes Intravenous Every 24 hours 09/07/11 0915     09/01/11 1400   metroNIDAZOLE (FLAGYL) tablet 500 mg  Status:  Discontinued        500 mg Oral 3 times per day 09/01/11 0749 09/09/11 0929   08/28/11 1400   metroNIDAZOLE (FLAGYL) tablet 250 mg  Status:  Discontinued        250 mg Oral 3 times per day 08/28/11 1318 09/01/11 0749   08/27/11 1600   ertapenem (INVANZ) 1 g in sodium chloride 0.9 % 50 mL IVPB  Status:  Discontinued        1 g 100 mL/hr over 30 Minutes Intravenous Every 24 hours 08/27/11 1534 09/07/11 1444   08/27/11 1500   piperacillin-tazobactam (ZOSYN) IVPB 3.375 g  Status:  Discontinued        3.375 g 12.5 mL/hr over 240 Minutes Intravenous  Once 08/27/11 1436 08/27/11 1553           Assessment/Plan  1. S/p ex lap with colectomy and colostomy 2. Perforated diverticulitis with multiple abscesses, s/p perc drains 3. C. Diff colitis 4. Post op ileus  Plan: 1. Cont NGT and await bowel function 2. Begin BID dressing changes 3. WOC, RN consult 4. Mobilize 5. Change metoprolol to IV for SCIP compliance 6. Dc foley in am   LOS: 13 days    Galdino Hinchman E 09/09/2011

## 2011-09-09 NOTE — Consult Note (Signed)
WOC ostomy consult  Stoma type/location:  LLQ, end colostomy Stomal assessment/size: aprox 2" round and appears to be nicely budded from the skin. Pink, moist and viable.   Only one day postop, and no output today, did not change pouch at today's initial visit.  Output  Scant blood in pouch, no flatus Ostomy pouching: 1pc karaya in place from OR, will order 2 pc . 2 3/4" for room for use for teaching and pouch change Monday.    Education provided: Educational booklets left in room for pt and family. Explained WOC role in care and teaching, pt aware will see again on Monday.    WOC will follow along with you for ostomy teaching and care.  Kebrina Friend [place], [place] [phone]

## 2011-09-09 NOTE — Op Note (Signed)
[name], [name]                 ACCOUNT NO.:  [id number]  MEDICAL RECORD NO.:  [id number]  LOCATION:  1536                         FACILITY:  [facility]  PHYSICIAN:  Velora Heckler, MD      DATE OF BIRTH:  1955/09/04  DATE OF PROCEDURE:  09/08/2011                               OPERATIVE REPORT   PREOPERATIVE DIAGNOSIS:  Perforated diverticulitis with intra-abdominal abscess.  POSTOPERATIVE DIAGNOSIS:  Perforated diverticulitis with intra-abdominal abscess.  PROCEDURES: 1. Exploratory laparotomy. 2. Low anterior resection, distal sigmoid colon. 3. Descending colostomy. 4. Incidental appendectomy.  SURGEON:  Velora Heckler, MD, FACS  ANESTHESIA:  General per Phillips Grout, MD  ESTIMATED BLOOD LOSS:  50 mL.  PREPARATION:  ChloraPrep.  COMPLICATIONS:  None.  INDICATIONS:  The patient is a 56 year old white male, admitted 12 days ago to the surgical service with perforated diverticulitis.  The patient was started on intravenous antibiotics and underwent percutaneous drainage procedures.  CT scan on August 21 demonstrated persistent leakage from the colon, intra-abdominal free air, and intra-abdominal abscesses.  After discussion with the patient, a decision was made to proceed with laparotomy, colonic resection, and colostomy placement.  BODY OF REPORT:  Procedure was done in OR #1 at the [facility].  The patient was brought to the operating room, placed in supine position on the operating room table.  Following administration of general anesthesia, the patient was prepped and draped in the usual strict aseptic fashion.  After ascertaining that an adequate level of anesthesia had been achieved, midline abdominal incision was made with a #10 blade.  Dissection was carried through subcutaneous tissues and hemostasis obtained with electrocautery. Fascia was incised in the midline and the peritoneal cavity was entered cautiously.  Within the abdomen, there  was a moderate amount of ascitic fluid.  There was no significant odor.  There were multiple interloop adhesions and interloop fluid collections.  There were abscesses along the colic gutters bilaterally.  Previously, placed percutaneous drains are divided and removed in their entirety.  The small bowel was then run from the ligament of Treitz to the ileocecal valve taking care to manually disrupt all interloop fluid collections which were evacuated. The sigmoid colon was acutely inflamed.  There was green exudate and fibrinous material in the pelvis.  In the distal sigmoid colon, there was an obvious perforation with visible mucosa.  A right-angle clamp was easily passed into the lumen of the bowel through this site.  This site represents the perforation.  It was marked with a silk suture.  Abdomen was copiously irrigated with several liters of warm saline. This was evacuated.  The Balfour retractor was placed and the small bowel, was packed cephalad and held in place with a retractor.  A point in the proximal sigmoid colon was identified and transected with a GIA stapler.  Mesentery of the mid and distal sigmoid colon was divided with the LigaSure and suture ligated with 2-0 silk suture ligatures. Dissection was carried distal to the perforation in the pelvis to the proximal rectum.  The rectal wall was cleared circumferentially and then transected with a contour stapler.  The specimen was removed and submitted  to Pathology, labeled sigmoid colon with perforation and marked with suture.  Abdomen was again irrigated with warm saline, which was evacuated.  Good hemostasis was achieved.  The rectal stump suture line was marked at either end with a 2-0 Prolene suture.  The descending colon and proximal sigmoid colon are normalize from their lateral peritoneal attachments to facilitate colostomy placement.  Nasogastric tube was properly positioned within the stomach.  An ellipse of skin  was excised from the left lower quadrant of the abdominal wall.  Adipose tissue was excised.  The fascia was incised in a cruciate fashion and the peritoneal cavity entered.  Using a Babcock clamp, the proximal sigmoid colon was delivered through this incision to the skin for later maturation as a colostomy.  The bowel was secured to the fascia circumferentially with interrupted silk sutures.  Omentum was used to cover the small bowel.  The appendix is evident in the right lower quadrant.  Given the complexity of a future appendectomy, decision was made to perform incidental appendectomy in this already contaminated field.  Therefore, the mesoappendix was divided with the LigaSure.  The base of the appendix was ligated with a 3-0 silk tie.  The appendix was then amputated and the stump cauterized with electrocautery.  The appendix was submitted to pathology for review.  Omentum was used to cover the small bowel.  Midline fascial incision was closed with interrupted #1 Novafil simple sutures.  Subcutaneous tissues were irrigated.  Skin was loosely approximated with stainless steel staples.  Subcutaneous tissues were packed with Betadine-soaked 4 x 4 gauze sponges.  The staple line at the sigmoid colon is excised with the electrocautery. The end of the sigmoid colon was then matured to the skin edges with interrupted 3-0 Vicryl sutures to mature the colostomy circumferentially.  Wounds were washed and dried.  Sterile dressings were applied.  Ostomy bag is applied.  The patient was awakened from anesthesia and brought to the recovery room.  The patient tolerated the procedure well.  Velora Heckler, MD, [facility] Office: [phone]    TMG/MEDQ  D:  09/08/2011  T:  09/09/2011  Job:  [id number]

## 2011-09-09 NOTE — Progress Notes (Signed)
PARENTERAL NUTRITION CONSULT NOTE - FOLLOW UP  Pharmacy Consult for TNA   Indication: Prolonged Ileus  No Known Allergies  Patient Measurements: Height: 6\' 2"  (188 cm) Weight: 165 lb 9.1 oz (75.1 kg) IBW/kg (Calculated) : 82.2  Usual Weight: 78.5 kg 08/22/11  Vital Signs: Temp: 98.1 F (36.7 C) (08/23 0600) Temp src: Oral (08/23 0600) BP: 116/80 mmHg (08/23 0600) Pulse Rate: 119  (08/23 0600) Intake/Output from previous day: 08/22 0701 - 08/23 0700 In: 2300 [I.V.:2300] Out: 2175 [Urine:1875; Drains:50; Blood:250] Intake/Output from this shift:    Labs:  [facility] 09/09/11 0315 09/08/11 1540  WBC 15.3* 13.9*  HGB 11.1* 11.6*  HCT 33.0* 34.4*  PLT 336 417*  APTT -- --  INR -- --     Basename 09/09/11 0315 09/08/11 1540 09/08/11 0631  NA 129* -- 129*  K 4.6 -- 4.4  CL 97 -- 97  CO2 28 -- 29  GLUCOSE 149* -- 120*  BUN 12 -- 12  CREATININE 0.80 0.85 0.88  LABCREA -- -- --  CREAT24HRUR -- -- --  CALCIUM 7.6* -- 7.7*  MG -- -- 2.2  PHOS -- -- 2.9  PROT 5.5* -- 5.7*  ALBUMIN 1.6* -- 1.8*  AST 127* -- 272*  ALT 179* -- 233*  ALKPHOS 81 -- 93  BILITOT 0.3 -- 0.2*  BILIDIR -- -- --  IBILI -- -- --  PREALBUMIN -- -- --  TRIG -- -- --  CHOLHDL -- -- --  CHOL -- -- --  Corrected calcium: 9.52 on 8/23  Estimated Creatinine Clearance: 108.2 ml/min (by C-G formula based on Cr of 0.8).   CBGs and Insulin Requirements in past 24 hours: CBGs 158-229, requiring 10 units Novolog SSI used (on sensitive-scale coverage)  Nutritional Goals:  Per RD recommendation (8/16): 1875-2250 kcal/day, 90-110 gm/day protein  Clinimix-E 5/20 at a goal rate of 80 ml/hr + lipids at 10 ml/hr provides: 96 g/day protein and 2169 Kcal/day MWF, 1689 Kcal/day STTHS (Avg. 1896 Kcal/day weekly).  Current Nutrition:  Clinimix-E 5/20 at 80 mL/hr  Lipids 20% at 10 mL/hr MWF only mIVF = D5 + 1/2 NS + KCl 20 mEq/L @ 50 ml/hr NPO except for ice chips   Labs:  Lytes: WNL except Na low 129.   Unable to alter electrolyte content of premixed Clinimix-E formulas Renal: SCr and BUN WNL LFTs: AST and ALT improving some 8/23.  Note: AST/ALT were elevated before TNA began.  CBGs: elevated post-op Triglycerides: 172 on 8/19 Prealbumin: 8.7 on 8/19,  Improved from 6.8 on 8/17.  Assessment:  56 y/o M admitted 08/31/11 with perforated diverticulitis, possible new abscess, prolonged ileus. TNA was started 8/17. CT reportedly shows persistent leak from colon with free air and abscesses in peritoneal cavity and pt underwent ex lap with low anterior resection of sigmoid colon, descending colostomy and incidental appendectomy 8/22  Tolerating TNA at goal rate.  CBGs elevated post-op on sensitive sliding scale insulin - anticipate this to be transient increase in insulin requirements. MD added IVF 8/22 given post-op hypotension and tachy.  Transaminases improving - continue to monitor  Plan:    Continue Clinimix-E 5/20 at goal rate of 72ml/hr.   IV lipids, MVI, and TE on MWF only due to national shortage  Change SSI to moderate coverage q4h   Monitor LFTs - If TNA is contributory could try lower dextrose concentration +/- cycling.  Geoffry Paradise, PharmD, BCPS Pager: [phone] 7:21 AM Pharmacy #: [phone]

## 2011-09-10 LAB — BASIC METABOLIC PANEL
BUN: 13 mg/dL (ref 6–23)
CO2: 30 mEq/L (ref 19–32)
GFR calc non Af Amer: >90 mL/min (ref >90)
Glucose, Bld: 137 mg/dL — ABNORMAL HIGH (ref 70–99)
Potassium: 4.4 mEq/L (ref 3.5–5.1)

## 2011-09-10 LAB — CBC
HCT: 29.5 % — ABNORMAL LOW (ref 39.0–52.0)
Hemoglobin: 9.8 g/dL — ABNORMAL LOW (ref 13.0–17.0)
MCH: 30.4 pg (ref 26.0–34.0)
MCHC: 33.2 g/dL (ref 30.0–36.0)
RBC: 3.22 MIL/uL — ABNORMAL LOW (ref 4.22–5.81)

## 2011-09-10 LAB — GLUCOSE, CAPILLARY
Glucose-Capillary: 133 mg/dL — ABNORMAL HIGH (ref 70–99)
Glucose-Capillary: 138 mg/dL — ABNORMAL HIGH (ref 70–99)
Glucose-Capillary: 139 mg/dL — ABNORMAL HIGH (ref 70–99)
Glucose-Capillary: 147 mg/dL — ABNORMAL HIGH (ref 70–99)

## 2011-09-10 MED ORDER — CLINIMIX E/DEXTROSE (5/20) 5 % IV SOLN
INTRAVENOUS | Status: AC
  Administered 2011-09-10: 18:00:00 via INTRAVENOUS
  Filled 2011-09-10: qty 2000

## 2011-09-10 NOTE — Progress Notes (Signed)
PARENTERAL NUTRITION CONSULT NOTE - FOLLOW UP  Pharmacy Consult for TNA   Indication: Prolonged Ileus  No Known Allergies  Patient Measurements: Height: 6\' 2"  (188 cm) Weight: 165 lb 9.1 oz (75.1 kg) IBW/kg (Calculated) : 82.2  Usual Weight: 78.5 kg 08/22/11  Vital Signs: Temp: 98.3 F (36.8 C) (08/24 0615) Temp src: Oral (08/24 0615) BP: 107/70 mmHg (08/24 0615) Pulse Rate: 95  (08/24 0615) Intake/Output from previous day: 08/23 0701 - 08/24 0700 In: 7580.3 [I.V.:833.3; IV Piggyback:300; ZOX:0960] Out: 4125 [Urine:2525] Intake/Output from this shift:    Labs:  [facility] 09/10/11 0434 09/09/11 0315 09/08/11 1540  WBC 10.9* 15.3* 13.9*  HGB 9.8* 11.1* 11.6*  HCT 29.5* 33.0* 34.4*  PLT 363 336 417*  APTT -- -- --  INR -- -- --     Basename 09/10/11 0434 09/09/11 0315 09/08/11 1540 09/08/11 0631  NA 129* 129* -- 129*  K 4.4 4.6 -- 4.4  CL 96 97 -- 97  CO2 30 28 -- 29  GLUCOSE 137* 149* -- 120*  BUN 13 12 -- 12  CREATININE 0.81 0.80 0.85 --  LABCREA -- -- -- --  CREAT24HRUR -- -- -- --  CALCIUM 7.6* 7.6* -- 7.7*  MG -- -- -- 2.2  PHOS -- -- -- 2.9  PROT -- 5.5* -- 5.7*  ALBUMIN -- 1.6* -- 1.8*  AST -- 127* -- 272*  ALT -- 179* -- 233*  ALKPHOS -- 81 -- 93  BILITOT -- 0.3 -- 0.2*  BILIDIR -- -- -- --  IBILI -- -- -- --  PREALBUMIN -- -- -- --  TRIG -- -- -- --  CHOLHDL -- -- -- --  CHOL -- -- -- --  Corrected calcium: 9.52 on 8/23  Estimated Creatinine Clearance: 106.9 ml/min (by C-G formula based on Cr of 0.81).   CBGs and Insulin Requirements in past 24 hours: CBGs 128-152 , requiring 13 units Novolog SSI used (on moderate-scale coverage)  Nutritional Goals:  Per RD recommendation (8/16): 1875-2250 kcal/day, 90-110 gm/day protein  Clinimix-E 5/20 at a goal rate of 80 ml/hr + lipids at 10 ml/hr provides: 96 g/day protein and 2169 Kcal/day MWF, 1689 Kcal/day STTHS (Avg. 1896 Kcal/day weekly).  Current Nutrition:  Clinimix-E 5/20 at 80 mL/hr  Lipids  20% at 10 mL/hr MWF only mIVF = D5 + 1/2 NS + KCl 20 mEq/L @ 50 ml/hr NPO except for ice chips   Labs:  Lytes: WNL except Na low 129 but this is stable.  Unable to alter electrolyte content of premixed Clinimix-E formulas Renal: SCr and BUN WNL LFTs: AST and ALT improving some 8/23.  Note: AST/ALT were elevated before TNA began.  CBGs: stabilizing now post op Triglycerides: 172 on 8/19 Prealbumin: 8.7 on 8/19,  Improved from 6.8 on 8/17.  Assessment:  56 y/o M admitted 08/31/11 with perforated diverticulitis, possible new abscess, prolonged ileus. TNA was started 8/17. CT reportedly shows persistent leak from colon with free air and abscesses in peritoneal cavity and pt underwent ex lap with low anterior resection of sigmoid colon, descending colostomy and incidental appendectomy 8/22  Tolerating TNA at goal rate.   Transaminases improving - continue to monitor  Plan:    Continue Clinimix-E 5/20 at goal rate of 65ml/hr.   IV lipids, MVI, and TE on MWF only due to national shortage  Continue SSI moderate coverage q4h   Monitor LFTs - If TNA is contributory could try lower dextrose concentration +/- cycling.  Hessie Knows, PharmD, BCPS  Pager [phone] 09/10/2011 7:28 AM

## 2011-09-10 NOTE — Progress Notes (Signed)
2 Days Post-Op  Subjective: Feeling better every day.    Objective: Vital signs in last 24 hours: Temp:  [98.3 F (36.8 C)-99.2 F (37.3 C)] 98.3 F (36.8 C) (08/24 1000) Pulse Rate:  [95-109] 105  (08/24 1000) Resp:  [16-24] 17  (08/24 1122) BP: (102-112)/(62-75) 112/75 mmHg (08/24 1000) SpO2:  [94 %-100 %] 100 % (08/24 1122) Last BM Date: 09/08/11  Intake/Output from previous day: 08/23 0701 - 08/24 0700 In: 7580.3 [I.V.:833.3; IV Piggyback:300; JXB:1478] Out: 4125 [Urine:2525] Intake/Output this shift: Total I/O In: -  Out: 450 [Urine:450]  General appearance: alert, cooperative and no distress Resp: clear to auscultation bilaterally Cardio: mild tachycardia, regular GI: soft, appropriate incisional tenderness, ND, no infection, ostomy looks healthy but not much out  Lab Results:   [facility] 09/10/11 0434 09/09/11 0315  WBC 10.9* 15.3*  HGB 9.8* 11.1*  HCT 29.5* 33.0*  PLT 363 336   BMET  Basename 09/10/11 0434 09/09/11 0315  NA 129* 129*  K 4.4 4.6  CL 96 97  CO2 30 28  GLUCOSE 137* 149*  BUN 13 12  CREATININE 0.81 0.80  CALCIUM 7.6* 7.6*   PT/INR No results found for this basename: LABPROT:2,INR:2 in the last 72 hours ABG No results found for this basename: PHART:2,PCO2:2,PO2:2,HCO3:2 in the last 72 hours  Studies/Results: No results found.  Anti-infectives: Anti-infectives     Start     Dose/Rate Route Frequency Ordered Stop   09/09/11 1400   metroNIDAZOLE (FLAGYL) tablet 500 mg  Status:  Discontinued     Comments: Clamp NGT for 1 hr after giving flagyl      500 mg Oral 3 times per day 09/09/11 0929 09/09/11 1106   09/09/11 1200   metroNIDAZOLE (FLAGYL) IVPB 500 mg        500 mg 100 mL/hr over 60 Minutes Intravenous Every 8 hours 09/09/11 1106     09/07/11 1600   cefTRIAXone (ROCEPHIN) 1 g in dextrose 5 % 50 mL IVPB        1 g 100 mL/hr over 30 Minutes Intravenous Every 24 hours 09/07/11 1444     09/07/11 1000   fluconazole (DIFLUCAN) IVPB  100 mg        100 mg 50 mL/hr over 60 Minutes Intravenous Every 24 hours 09/07/11 0915     09/01/11 1400   metroNIDAZOLE (FLAGYL) tablet 500 mg  Status:  Discontinued        500 mg Oral 3 times per day 09/01/11 0749 09/09/11 0929   08/28/11 1400   metroNIDAZOLE (FLAGYL) tablet 250 mg  Status:  Discontinued        250 mg Oral 3 times per day 08/28/11 1318 09/01/11 0749   08/27/11 1600   ertapenem (INVANZ) 1 g in sodium chloride 0.9 % 50 mL IVPB  Status:  Discontinued        1 g 100 mL/hr over 30 Minutes Intravenous Every 24 hours 08/27/11 1534 09/07/11 1444   08/27/11 1500   piperacillin-tazobactam (ZOSYN) IVPB 3.375 g  Status:  Discontinued        3.375 g 12.5 mL/hr over 240 Minutes Intravenous  Once 08/27/11 1436 08/27/11 1553          Assessment/Plan: s/p Procedure(s) (LRB): EXPLORATORY LAPAROTOMY (N/A) APPENDECTOMY (N/A) awaiting return of bowel function but he looks pretty good today., continue TPN and fluids for now. Continue NG until output down and bowel function  LOS: 14 days    Ryin Ambrosius, Zakery DAVID 09/10/2011

## 2011-09-11 ENCOUNTER — Inpatient Hospital Stay (HOSPITAL_COMMUNITY): Payer: Managed Care, Other (non HMO)

## 2011-09-11 LAB — COMPREHENSIVE METABOLIC PANEL
ALT: 118 U/L — ABNORMAL HIGH (ref 0–53)
Alkaline Phosphatase: 123 U/L — ABNORMAL HIGH (ref 39–117)
BUN: 14 mg/dL (ref 6–23)
CO2: 34 mEq/L — ABNORMAL HIGH (ref 19–32)
Calcium: 8 mg/dL — ABNORMAL LOW (ref 8.4–10.5)
GFR calc Af Amer: >90 mL/min (ref >90)
GFR calc non Af Amer: >90 mL/min (ref >90)
Glucose, Bld: 130 mg/dL — ABNORMAL HIGH (ref 70–99)
Potassium: 4.2 mEq/L (ref 3.5–5.1)
Sodium: 132 mEq/L — ABNORMAL LOW (ref 135–145)

## 2011-09-11 LAB — GLUCOSE, CAPILLARY
Glucose-Capillary: 141 mg/dL — ABNORMAL HIGH (ref 70–99)
Glucose-Capillary: 133 mg/dL — ABNORMAL HIGH (ref 70–99)
Glucose-Capillary: 125 mg/dL — ABNORMAL HIGH (ref 70–99)

## 2011-09-11 MED ORDER — CLINIMIX E/DEXTROSE (5/20) 5 % IV SOLN
INTRAVENOUS | Status: AC
  Administered 2011-09-11: 17:00:00 via INTRAVENOUS
  Filled 2011-09-11: qty 2000

## 2011-09-11 MED ORDER — ALTEPLASE 2 MG IJ SOLR
2.0000 mg | Freq: Once | INTRAMUSCULAR | Status: AC
  Administered 2011-09-11: 2 mg
  Filled 2011-09-11: qty 2

## 2011-09-11 NOTE — Progress Notes (Signed)
3 Days Post-Op  Subjective: No issues.  Pain controlled.    Objective: Vital signs in last 24 hours: Temp:  [98.3 F (36.8 C)-99 F (37.2 C)] 98.3 F (36.8 C) (08/25 0615) Pulse Rate:  [95-109] 107  (08/25 0615) Resp:  [16-22] 17  (08/25 0750) BP: (104-112)/(70-75) 106/72 mmHg (08/25 0615) SpO2:  [99 %-100 %] 100 % (08/25 0750) Last BM Date:  (no stool noted in ielostomy-small amt liquid brown/red fluid)  Intake/Output from previous day: 08/24 0701 - 08/25 0700 In: 2946.5 [I.V.:1000; NG/GT:50; IV Piggyback:200; TPN:1696.5] Out: 3200 [Urine:1250; Emesis/NG output:1050] Intake/Output this shift:    General appearance: alert, cooperative and no distress Resp: clear to auscultation bilaterally Cardio: regular rate and rhythm, S1, S2 normal, no murmur, click, rub or gallop GI: soft, still with mild distension, no significant tenderness, wound repacked and healing well, ostomy viable but no significant output still  Lab Results:   Basename 09/10/11 0434 09/09/11 0315  WBC 10.9* 15.3*  HGB 9.8* 11.1*  HCT 29.5* 33.0*  PLT 363 336   BMET  Basename 09/11/11 0715 09/10/11 0434  NA 132* 129*  K 4.2 4.4  CL 96 96  CO2 34* 30  GLUCOSE 130* 137*  BUN 14 13  CREATININE 0.90 0.81  CALCIUM 8.0* 7.6*   PT/INR No results found for this basename: LABPROT:2,INR:2 in the last 72 hours ABG No results found for this basename: PHART:2,PCO2:2,PO2:2,HCO3:2 in the last 72 hours  Studies/Results: No results found.  Anti-infectives: Anti-infectives     Start     Dose/Rate Route Frequency Ordered Stop   09/09/11 1400   metroNIDAZOLE (FLAGYL) tablet 500 mg  Status:  Discontinued     Comments: Clamp NGT for 1 hr after giving flagyl      500 mg Oral 3 times per day 09/09/11 0929 09/09/11 1106   09/09/11 1200   metroNIDAZOLE (FLAGYL) IVPB 500 mg        500 mg 100 mL/hr over 60 Minutes Intravenous Every 8 hours 09/09/11 1106     09/07/11 1600   cefTRIAXone (ROCEPHIN) 1 g in dextrose 5  % 50 mL IVPB        1 g 100 mL/hr over 30 Minutes Intravenous Every 24 hours 09/07/11 1444     09/07/11 1000   fluconazole (DIFLUCAN) IVPB 100 mg        100 mg 50 mL/hr over 60 Minutes Intravenous Every 24 hours 09/07/11 0915     09/01/11 1400   metroNIDAZOLE (FLAGYL) tablet 500 mg  Status:  Discontinued        500 mg Oral 3 times per day 09/01/11 0749 09/09/11 0929   08/28/11 1400   metroNIDAZOLE (FLAGYL) tablet 250 mg  Status:  Discontinued        250 mg Oral 3 times per day 08/28/11 1318 09/01/11 0749   08/27/11 1600   ertapenem (INVANZ) 1 g in sodium chloride 0.9 % 50 mL IVPB  Status:  Discontinued        1 g 100 mL/hr over 30 Minutes Intravenous Every 24 hours 08/27/11 1534 09/07/11 1444   08/27/11 1500   piperacillin-tazobactam (ZOSYN) IVPB 3.375 g  Status:  Discontinued        3.375 g 12.5 mL/hr over 240 Minutes Intravenous  Once 08/27/11 1436 08/27/11 1553          Assessment/Plan: s/p Procedure(s) (LRB): EXPLORATORY LAPAROTOMY (N/A) APPENDECTOMY (N/A) awaiting return of bowel function.  only 1L of NG output yesterday but still black.  will check xrays for NG placement.    LOS: 15 days    [name], [name] 09/11/2011

## 2011-09-11 NOTE — Progress Notes (Signed)
Dr. Maisie Fus On unit to evaluate patient.  Dr. Maisie Fus Notified of patients BP in 1536 and SBP less than 110.  Question if lopressor parameters needed.  Order given for parameters.  BP medicine held per md order and parameters.

## 2011-09-11 NOTE — Progress Notes (Signed)
PARENTERAL NUTRITION CONSULT NOTE - FOLLOW UP  Pharmacy Consult for TNA   Indication: Prolonged Ileus  No Known Allergies  Patient Measurements: Height: 6\' 2"  (188 cm) Weight: 165 lb 9.1 oz (75.1 kg) IBW/kg (Calculated) : 82.2  Usual Weight: 78.5 kg 08/22/11  Vital Signs: Temp: 98.3 F (36.8 C) (08/25 0615) Temp src: Oral (08/25 0615) BP: 106/72 mmHg (08/25 0615) Pulse Rate: 107  (08/25 0615) Intake/Output from previous day: 08/24 0701 - 08/25 0700 In: 2946.5 [I.V.:1000; NG/GT:50; IV Piggyback:200; TPN:1696.5] Out: 3200 [Urine:1250; Emesis/NG output:1050] Intake/Output from this shift:    Labs:  [facility] 09/10/11 0434 09/09/11 0315 09/08/11 1540  WBC 10.9* 15.3* 13.9*  HGB 9.8* 11.1* 11.6*  HCT 29.5* 33.0* 34.4*  PLT 363 336 417*  APTT -- -- --  INR -- -- --     Basename 09/11/11 0715 09/10/11 0434 09/09/11 0315  NA 132* 129* 129*  K 4.2 4.4 4.6  CL 96 96 97  CO2 34* 30 28  GLUCOSE 130* 137* 149*  BUN 14 13 12   CREATININE 0.90 0.81 0.80  LABCREA -- -- --  CREAT24HRUR -- -- --  CALCIUM 8.0* 7.6* 7.6*  MG -- -- --  PHOS -- -- --  PROT 5.6* -- 5.5*  ALBUMIN 1.7* -- 1.6*  AST 78* -- 127*  ALT 118* -- 179*  ALKPHOS 123* -- 81  BILITOT 0.3 -- 0.3  BILIDIR -- -- --  IBILI -- -- --  PREALBUMIN -- -- --  TRIG -- -- --  CHOLHDL -- -- --  CHOL -- -- --  Corrected calcium: 9.52 on 8/23  Estimated Creatinine Clearance: 96.2 ml/min (by C-G formula based on Cr of 0.9).   CBGs and Insulin Requirements in past 24 hours: CBGs 107-149, requiring 10 units Novolog SSI used (on moderate-scale coverage)  Nutritional Goals:  Per RD recommendation (8/16): 1875-2250 kcal/day, 90-110 gm/day protein  Clinimix-E 5/20 at a goal rate of 80 ml/hr + lipids at 10 ml/hr provides: 96 g/day protein and 2169 Kcal/day MWF, 1689 Kcal/day STTHS (Avg. 1896 Kcal/day weekly).  Current Nutrition:  Clinimix-E 5/20 at 80 mL/hr  Lipids 20% at 10 mL/hr MWF only mIVF = D5 + 1/2 NS + KCl 20  mEq/L @ 50 ml/hr NPO except for ice chips   Labs:  Lytes: WNL except Na low but this is stable.  Unable to alter electrolyte content of premixed Clinimix-E formulas Renal: SCr and BUN WNL LFTs: AST and ALT improving.  Note: AST/ALT were elevated before TNA began.  CBGs: stable Triglycerides: 172 on 8/19 Prealbumin: 8.7 on 8/19,  Improved from 6.8 on 8/17.  Assessment:  56 y/o M admitted 08/31/11 with perforated diverticulitis, possible new abscess, prolonged ileus. TNA was started 8/17. CT reportedly shows persistent leak from colon with free air and abscesses in peritoneal cavity and pt underwent ex lap with low anterior resection of sigmoid colon, descending colostomy and incidental appendectomy 8/22  Tolerating TNA at goal rate.   Transaminases improving - continue to monitor  Plan:    Continue Clinimix-E 5/20 at goal rate of 50ml/hr.   IV lipids, MVI, and TE on MWF only due to national shortage  Continue SSI moderate coverage q4h   Monitor LFTs but these are improving slowly - If TNA is contributory could try lower dextrose concentration +/- cycling.  Hessie Knows, PharmD, BCPS Pager [phone] 09/11/2011 8:27 AM

## 2011-09-12 LAB — CBC WITH DIFFERENTIAL/PLATELET
Basophils Absolute: 0.1 10*3/uL (ref 0.0–0.1)
Basophils Relative: 1 % (ref 0–1)
Eosinophils Relative: 2 % (ref 0–5)
HCT: 30.6 % — ABNORMAL LOW (ref 39.0–52.0)
MCHC: 33.3 g/dL (ref 30.0–36.0)
MCV: 91.3 fL (ref 78.0–100.0)
Monocytes Absolute: 1.2 10*3/uL — ABNORMAL HIGH (ref 0.1–1.0)
Monocytes Relative: 13 % — ABNORMAL HIGH (ref 3–12)
RDW: 14 % (ref 11.5–15.5)

## 2011-09-12 LAB — COMPREHENSIVE METABOLIC PANEL
BUN: 16 mg/dL (ref 6–23)
CO2: 34 mEq/L — ABNORMAL HIGH (ref 19–32)
Chloride: 98 mEq/L (ref 96–112)
Creatinine, Ser: 0.85 mg/dL (ref 0.50–1.35)
GFR calc Af Amer: >90 mL/min (ref >90)
GFR calc non Af Amer: >90 mL/min (ref >90)
Glucose, Bld: 129 mg/dL — ABNORMAL HIGH (ref 70–99)
Total Bilirubin: 0.2 mg/dL — ABNORMAL LOW (ref 0.3–1.2)

## 2011-09-12 LAB — GLUCOSE, CAPILLARY
Glucose-Capillary: 129 mg/dL — ABNORMAL HIGH (ref 70–99)
Glucose-Capillary: 134 mg/dL — ABNORMAL HIGH (ref 70–99)
Glucose-Capillary: 139 mg/dL — ABNORMAL HIGH (ref 70–99)

## 2011-09-12 LAB — PHOSPHORUS: Phosphorus: 4.1 mg/dL (ref 2.3–4.6)

## 2011-09-12 LAB — MAGNESIUM: Magnesium: 2.2 mg/dL (ref 1.5–2.5)

## 2011-09-12 MED ORDER — LIP MEDEX EX OINT
1.0000 "application " | TOPICAL_OINTMENT | Freq: Two times a day (BID) | CUTANEOUS | Status: DC
  Administered 2011-09-14: 1 via TOPICAL
  Filled 2011-09-12 (×2): qty 7

## 2011-09-12 MED ORDER — ZINC TRACE METAL 1 MG/ML IV SOLN
INTRAVENOUS | Status: DC
  Filled 2011-09-12: qty 2000

## 2011-09-12 MED ORDER — FLUCONAZOLE IN SODIUM CHLORIDE 400-0.9 MG/200ML-% IV SOLN
400.0000 mg | INTRAVENOUS | Status: DC
  Administered 2011-09-13 – 2011-09-14 (×2): 400 mg via INTRAVENOUS
  Filled 2011-09-12 (×4): qty 200

## 2011-09-12 MED ORDER — METRONIDAZOLE IN NACL 5-0.79 MG/ML-% IV SOLN
500.0000 mg | Freq: Four times a day (QID) | INTRAVENOUS | Status: DC
  Administered 2011-09-12 – 2011-09-15 (×10): 500 mg via INTRAVENOUS
  Filled 2011-09-12 (×11): qty 100

## 2011-09-12 MED ORDER — MAGIC MOUTHWASH
15.0000 mL | Freq: Four times a day (QID) | ORAL | Status: DC | PRN
  Filled 2011-09-12: qty 15

## 2011-09-12 MED ORDER — FAT EMULSION 20 % IV EMUL
240.0000 mL | INTRAVENOUS | Status: AC
  Administered 2011-09-12: 240 mL via INTRAVENOUS
  Filled 2011-09-12: qty 250

## 2011-09-12 MED ORDER — KETOROLAC TROMETHAMINE 15 MG/ML IJ SOLN
15.0000 mg | Freq: Four times a day (QID) | INTRAMUSCULAR | Status: DC | PRN
  Administered 2011-09-12 – 2011-09-13 (×2): 15 mg via INTRAVENOUS
  Filled 2011-09-12 (×2): qty 1

## 2011-09-12 MED ORDER — MORPHINE SULFATE 2 MG/ML IJ SOLN: 2.0000 mg | INTRAMUSCULAR | Status: DC | PRN

## 2011-09-12 MED ORDER — ZINC TRACE METAL 1 MG/ML IV SOLN
INTRAVENOUS | Status: AC
  Administered 2011-09-12: 17:00:00 via INTRAVENOUS
  Filled 2011-09-12: qty 2000

## 2011-09-12 NOTE — Progress Notes (Signed)
PARENTERAL NUTRITION CONSULT NOTE - FOLLOW UP  Pharmacy Consult for TNA   Indication: Prolonged Ileus  No Known Allergies  Patient Measurements: Height: 6\' 2"  (188 cm) Weight: 165 lb 9.1 oz (75.1 kg) IBW/kg (Calculated) : 82.2  Usual Weight: 78.5 kg 08/22/11  Vital Signs: Temp: 98.3 F (36.8 C) (08/26 0433) Temp src: Oral (08/26 0433) BP: 107/74 mmHg (08/26 0626) Pulse Rate: 108  (08/26 0626) Intake/Output from previous day: 08/25 0701 - 08/26 0700 In: 582 [I.V.:400; NG/GT:30; IV Piggyback:150] Out: 4050 [Urine:1450; Emesis/NG output:2600] Intake/Output from this shift:    Labs:  [facility] 09/12/11 0610 09/10/11 0434  WBC 9.4 10.9*  HGB 10.2* 9.8*  HCT 30.6* 29.5*  PLT 370 363  APTT -- --  INR -- --     Basename 09/12/11 0610 09/11/11 0715 09/10/11 0434  NA 135 132* 129*  K 4.2 4.2 4.4  CL 98 96 96  CO2 34* 34* 30  GLUCOSE 129* 130* 137*  BUN 16 14 13   CREATININE 0.85 0.90 0.81  LABCREA -- -- --  CREAT24HRUR -- -- --  CALCIUM 8.2* 8.0* 7.6*  MG 2.2 -- --  PHOS 4.1 -- --  PROT 5.9* 5.6* --  ALBUMIN 1.8* 1.7* --  AST 49* 78* --  ALT 99* 118* --  ALKPHOS 117 123* --  BILITOT 0.2* 0.3 --  BILIDIR -- -- --  IBILI -- -- --  PREALBUMIN -- -- --  TRIG -- -- --  CHOLHDL -- -- --  CHOL -- -- --  Corrected calcium: 9.96 on 8/26  Estimated Creatinine Clearance: 101.9 ml/min (by C-G formula based on Cr of 0.85).   CBGs and Insulin Requirements in past 24 hours: CBGs 140-146, requiring 6 units Novolog SSI used (on moderate-scale coverage)  Nutritional Goals:  Per RD recommendation (8/16): 1875-2250 kcal/day, 90-110 gm/day protein  Clinimix-E 5/20 at a goal rate of 80 ml/hr + lipids at 10 ml/hr provides: 96 g/day protein and 2169 Kcal/day MWF, 1689 Kcal/day STTHS (Avg. 1896 Kcal/day weekly).  Current Nutrition:  Clinimix-E 5/20 at 80 mL/hr  Lipids 20% at 10 mL/hr MWF only mIVF = D5 + NS + KCl 20 mEq/L @ 50 ml/hr NPO except for ice chips   Labs:  Lytes:  WNL.  Renal: SCr and BUN WNL LFTs: AST and ALT improving.  Note: AST/ALT were elevated before TNA began.  CBGs: stable Triglycerides: 172 on 8/19, morning lab pending Prealbumin: 6.8 improved to -->8.7 on 8/19, morning lab pending  Assessment:  56 y/o M admitted 08/31/11 with perforated diverticulitis, possible new abscess, prolonged ileus. TNA was started 8/17. CT reportedly shows persistent leak from colon with free air and abscesses in peritoneal cavity and pt underwent ex lap with low anterior resection of sigmoid colon, descending colostomy and incidental appendectomy 8/22  Tolerating TNA at goal rate.   Transaminases improving - continue to monitor  Plan:    Continue Clinimix-E 5/20 at goal rate of 84ml/hr.   IV lipids, MVI, and TE on MWF only due to national shortage  Continue SSI moderate coverage q4h   Haynes Hoehn, PharmD 09/12/2011 7:10 AM  Pager: [phone]

## 2011-09-12 NOTE — Progress Notes (Signed)
4 Days Post-Op  Subjective: He feels much better, he has some gas in pouch and some soft stool. That is new since 9:30 this Am ostomy nurse reported just scant bloody output.  Objective: Vital signs in last 24 hours: Temp:  [97.9 F (36.6 C)-98.4 F (36.9 C)] 98.3 F (36.8 C) (08/26 0433) Pulse Rate:  [105-116] 108  (08/26 0626) Resp:  [16-20] 18  (08/26 0433) BP: (107-113)/(73-79) 107/74 mmHg (08/26 0626) SpO2:  [97 %-100 %] 100 % (08/26 0433) Last BM Date: 09/11/11 (llq abdomen to straight drain with bloody drainage noted)  2600 from NG, nothing per ostomy, Tna/NPO for nutrition, Lovenox DVT, lopressor IV, afebrile, Tachycardic VSS, Labs stable ALT is better Intake/Output from previous day: 08/25 0701 - 08/26 0700 In: 582 [I.V.:400; NG/GT:30; IV Piggyback:150] Out: 4050 [Urine:1450; Emesis/NG output:2600] Intake/Output this shift:    General appearance: alert, cooperative and no distress Resp: clear to auscultation bilaterally GI: soft, open areas of the wound look good, ostomy OK getting some gas into ostomy, and fresh soft stool this Am  Lab Results:   Basename 09/12/11 0610 09/10/11 0434  WBC 9.4 10.9*  HGB 10.2* 9.8*  HCT 30.6* 29.5*  PLT 370 363    BMET  Basename 09/12/11 0610 09/11/11 0715  NA 135 132*  K 4.2 4.2  CL 98 96  CO2 34* 34*  GLUCOSE 129* 130*  BUN 16 14  CREATININE 0.85 0.90  CALCIUM 8.2* 8.0*   PT/INR No results found for this basename: LABPROT:2,INR:2 in the last 72 hours   Lab 09/12/11 0610 09/11/11 0715 09/09/11 0315 09/08/11 0631  AST 49* 78* 127* 272*  ALT 99* 118* 179* 233*  ALKPHOS 117 123* 81 93  BILITOT 0.2* 0.3 0.3 0.2*  PROT 5.9* 5.6* 5.5* 5.7*  ALBUMIN 1.8* 1.7* 1.6* 1.8*     Lipase     Component Value Date/Time   LIPASE 59 08/27/2011 1136     Studies/Results: Dg Chest Port 1 View  09/11/2011  *RADIOLOGY REPORT*  Clinical Data: . 56 year old male with NG tube placement.  Recent exploratory laparotomy appendectomy.   PORTABLE CHEST - 1 VIEW  Comparison: 10/06/2003  Findings: The cardiomediastinal silhouette is unremarkable. Median sternotomy wires again noted. A right PICC line is present with tip overlying the mid SVC. An NG tube is identified with tip overlying the proximal - mid stomach. There is no evidence of focal airspace disease, pulmonary edema, suspicious pulmonary nodule/mass, pleural effusion, or pneumothorax. No acute bony abnormalities are identified.  IMPRESSION: No evidence of acute cardiopulmonary disease.  Support apparatus as described.   Original Report Authenticated By: Rosendo Gros, M.D.    Dg Abd Portable 1v  09/11/2011  *RADIOLOGY REPORT*  Clinical Data: Recent laparotomy and appendectomy.  Abdominal pain and mild distention.  NG tube placement.  PORTABLE ABDOMEN - 1 VIEW  Comparison: Recent CT  Findings: Abdominal/pelvic staples are present. An NG tube is identified with tip overlying the proximal - mid stomach. Mildly distended gas filled loops of small bowel and colon likely represent ileus. No acute bony abnormalities are identified.  IMPRESSION: Mildly distended small bowel loops and colon - likely mild postop ileus.  NG tube tip overlying the proximal - mid stomach.   Original Report Authenticated By: Rosendo Gros, M.D.     Medications:    . alteplase  2 mg Intracatheter Once  . cefTRIAXone (ROCEPHIN)  IV  1 g Intravenous Q24H  . enoxaparin (LOVENOX) injection  40 mg Subcutaneous Q24H  .  fluconazole (DIFLUCAN) IV  100 mg Intravenous Q24H  . HYDROmorphone PCA 0.3 mg/mL   Intravenous Q4H  . insulin aspart  0-15 Units Subcutaneous Q4H  . metoprolol  5 mg Intravenous Q6H  . metronidazole  500 mg Intravenous Q8H    Assessment/Plan Perforated diverticulitis with intra-abdominal abscess, s/p 1. Exploratory laparotomy.   Low anterior resection, distal sigmoid colon.  Descending colostomy. Incidental appendectomy. 09/08/11 Percutaneous drain LLQ abscess 08/31/11, drained, multiple  abscesses  Post op ileus C Diff colitis, he was on cipro prior to admission . Plan:  Continue to mobilize, continue TNA,  Continue antibiotics, DVT prophylaxis, Try some clamping trials today and see how he does, he's taking ice chips, which didn't make it to the I/O  If he does well take NG out tomorrow.      LOS: 16 days    Loribeth Katich 09/12/2011

## 2011-09-12 NOTE — Progress Notes (Signed)
Ileus resolving Agree w NGT clamping trials

## 2011-09-12 NOTE — Consult Note (Signed)
WOC ostomy follow up Stoma type/location: LLQ, end colostomy Stomal assessment/size:  1 3/4" x 2 1/4" oval shaped, budded from the skin  Peristomal assessment: intact without problems Treatment options for stomal/peristomal skin: none needed Output scant bloody drainage Ostomy pouching: /2pc. 2 3/4" wafer used to accommodate stoma size.  Education provided Demonstrated measurement of his stoma and cutting new wafer.  Lock and roll closure and attachment of the pouch to the wafer flange demonstrated as well. Answered questions and provided emotional support. Pt watched throughout pouch change and seems willing to learn care.  I will enroll in [facility] [employer] start dc program and send samples to pts home. Pt eager to dc home, would recommend [facility] for continued support for wound and ostomy care.   WOC will follow along with you for ostomy care and teaching. Shirl Weir [place], [place] [phone]

## 2011-09-13 LAB — GLUCOSE, CAPILLARY
Glucose-Capillary: 122 mg/dL — ABNORMAL HIGH (ref 70–99)
Glucose-Capillary: 122 mg/dL — ABNORMAL HIGH (ref 70–99)
Glucose-Capillary: 95 mg/dL (ref 70–99)
Glucose-Capillary: 125 mg/dL — ABNORMAL HIGH (ref 70–99)
Glucose-Capillary: 123 mg/dL — ABNORMAL HIGH (ref 70–99)

## 2011-09-13 MED ORDER — CLINIMIX E/DEXTROSE (5/20) 5 % IV SOLN
INTRAVENOUS | Status: DC
  Administered 2011-09-13: 17:00:00 via INTRAVENOUS
  Filled 2011-09-13: qty 2000

## 2011-09-13 MED ORDER — DEXTROSE 10 % IV SOLN
INTRAVENOUS | Status: AC
  Administered 2011-09-13: 13:00:00 via INTRAVENOUS
  Filled 2011-09-13: qty 1000

## 2011-09-13 NOTE — Progress Notes (Signed)
PARENTERAL NUTRITION CONSULT NOTE - FOLLOW UP  Pharmacy Consult for TNA   Indication: Prolonged Ileus  No Known Allergies  Patient Measurements: Height: 6\' 2"  (188 cm) Weight: 165 lb 9.1 oz (75.1 kg) IBW/kg (Calculated) : 82.2  Usual Weight: 78.5 kg 08/22/11  Vital Signs: Temp: 98.6 F (37 C) (08/27 0538) Temp src: Oral (08/27 0538) BP: 112/78 mmHg (08/27 0538) Pulse Rate: 92  (08/27 0538) Intake/Output from previous day: 08/26 0701 - 08/27 0700 In: 7114 [P.O.:260; I.V.:1593; IV Piggyback:500; TPN:4761] Out: 2010 [Urine:1260; Emesis/NG output:750] Intake/Output from this shift:    Labs:  [facility] 09/12/11 0610  WBC 9.4  HGB 10.2*  HCT 30.6*  PLT 370  APTT --  INR --     [facility] 09/12/11 0610 09/11/11 0715  NA 135 132*  K 4.2 4.2  CL 98 96  CO2 34* 34*  GLUCOSE 129* 130*  BUN 16 14  CREATININE 0.85 0.90  LABCREA -- --  CREAT24HRUR -- --  CALCIUM 8.2* 8.0*  MG 2.2 --  PHOS 4.1 --  PROT 5.9* 5.6*  ALBUMIN 1.8* 1.7*  AST 49* 78*  ALT 99* 118*  ALKPHOS 117 123*  BILITOT 0.2* 0.3  BILIDIR -- --  IBILI -- --  PREALBUMIN 18.6 --  TRIG 123 --  CHOLHDL -- --  CHOL 104 --  Corrected calcium: 9.96 on 8/26  Estimated Creatinine Clearance: 101.9 ml/min (by C-G formula based on Cr of 0.85).   CBGs and Insulin Requirements in past 24 hours: CBGs 122-146, requiring 12 units Novolog SSI used (on moderate-scale coverage)  Nutritional Goals:  Per RD recommendation (8/16): 1875-2250 kcal/day, 90-110 gm/day protein  Clinimix-E 5/20 at a goal rate of 80 ml/hr + lipids at 10 ml/hr provides: 96 g/day protein and 2169 Kcal/day MWF, 1689 Kcal/day STTHS (Avg. 1896 Kcal/day weekly).  Current Nutrition:  Clinimix-E 5/20 at 80 mL/hr  Lipids 20% at 10 mL/hr MWF only mIVF = D5 + NS + KCl 20 mEq/L @ 50 ml/hr NPO except for ice chips     Assessment:  56 y/o M admitted 08/27/11 with N/V and abdominal pain x 1 week, no improvement w/Cipro outpatient. CT revealed  diverticulitis with microperforation diverticulitis, possible new abscess, and reactive ileus. Perc drain of diverticular abscess done by IR 8/14 and second drain placed 8/16  TNA was started 8/17 and advanced to goal 8/19.   Repeat CT 8/21 showed persistent leak from colon with free air and abscesses in peritoneal cavity and pt underwent ex lap with low anterior resection of sigmoid colon, descending colostomy and incidental appendectomy 8/22.   NG clamped 8/26, and per MD note, ileus resolving Labs (no new labs 8/27)   Lytes: WNL 8/26  Renal: SCr and BUN WNL 8/26  LFTs: AST and ALT improving (8/26).  Note: AST/ALT were elevated before TNA began.   CBGs: stable  Triglycerides: WNL (8/26)  Prealbumin: improved, wnl (6.8 on 8/17 -->18.6 on 8/26)  Plan:    Continue Clinimix-E 5/20 at goal rate of 57ml/hr.   IV lipids, MVI, and TE on MWF only due to national shortage  Continue SSI moderate coverage q4h   Await advancement of diet to wean  Gwen Her PharmD  [phone] 09/13/2011 8:08 AM

## 2011-09-13 NOTE — Progress Notes (Signed)
Feeling better Flatus & BMs in bag Tol clears. Continue plan, hopefully home in 2 days if continues to improve

## 2011-09-13 NOTE — Progress Notes (Signed)
5 Days Post-Op  Subjective: He feels so much better, did his colostomy care this AM, wants the Ng Out.    Objective: Vital signs in last 24 hours: Temp:  [98.3 F (36.8 C)-98.6 F (37 C)] 98.6 F (37 C) (08/27 0538) Pulse Rate:  [92-117] 92  (08/27 0538) Resp:  [18] 18  (08/27 0538) BP: (106-112)/(74-79) 112/78 mmHg (08/27 0538) SpO2:  [96 %-99 %] 99 % (08/27 0538) Last BM Date: 09/13/11  260 po recorded, 750 nl per NG, diet icechips/TNA, afebrile, VSS, still a little tachycardic. No labs 200 ml from ostomy this AM.   Intake/Output from previous day: 08/26 0701 - 08/27 0700 In: 7114 [P.O.:260; I.V.:1593; IV Piggyback:500; TPN:4761] Out: 2010 [Urine:1260; Emesis/NG output:750] Intake/Output this shift: Total I/O In: -  Out: 20 [Emesis/NG output:20]  General appearance: alert, cooperative, no distress and anxious to get NG out. GI: soft, normal surgical tenderness, +BS, some stool and dark black liquid in bag now, some gas.  The open portion of the wound looks good..  Lab Results:   Basename 09/12/11 0610  WBC 9.4  HGB 10.2*  HCT 30.6*  PLT 370    BMET  Basename 09/12/11 0610 09/11/11 0715  NA 135 132*  K 4.2 4.2  CL 98 96  CO2 34* 34*  GLUCOSE 129* 130*  BUN 16 14  CREATININE 0.85 0.90  CALCIUM 8.2* 8.0*   PT/INR No results found for this basename: LABPROT:2,INR:2 in the last 72 hours   Lab 09/12/11 0610 09/11/11 0715 09/09/11 0315 09/08/11 0631  AST 49* 78* 127* 272*  ALT 99* 118* 179* 233*  ALKPHOS 117 123* 81 93  BILITOT 0.2* 0.3 0.3 0.2*  PROT 5.9* 5.6* 5.5* 5.7*  ALBUMIN 1.8* 1.7* 1.6* 1.8*     Lipase     Component Value Date/Time   LIPASE 59 08/27/2011 1136     Studies/Results: Dg Chest Port 1 View  09/11/2011  *RADIOLOGY REPORT*  Clinical Data: . 56 year old male with NG tube placement.  Recent exploratory laparotomy appendectomy.  PORTABLE CHEST - 1 VIEW  Comparison: 10/06/2003  Findings: The cardiomediastinal silhouette is unremarkable.  Median sternotomy wires again noted. A right PICC line is present with tip overlying the mid SVC. An NG tube is identified with tip overlying the proximal - mid stomach. There is no evidence of focal airspace disease, pulmonary edema, suspicious pulmonary nodule/mass, pleural effusion, or pneumothorax. No acute bony abnormalities are identified.  IMPRESSION: No evidence of acute cardiopulmonary disease.  Support apparatus as described.   Original Report Authenticated By: Rosendo Gros, M.D.    Dg Abd Portable 1v  09/11/2011  *RADIOLOGY REPORT*  Clinical Data: Recent laparotomy and appendectomy.  Abdominal pain and mild distention.  NG tube placement.  PORTABLE ABDOMEN - 1 VIEW  Comparison: Recent CT  Findings: Abdominal/pelvic staples are present. An NG tube is identified with tip overlying the proximal - mid stomach. Mildly distended gas filled loops of small bowel and colon likely represent ileus. No acute bony abnormalities are identified.  IMPRESSION: Mildly distended small bowel loops and colon - likely mild postop ileus.  NG tube tip overlying the proximal - mid stomach.   Original Report Authenticated By: Rosendo Gros, M.D.     Medications:    . cefTRIAXone (ROCEPHIN)  IV  1 g Intravenous Q24H  . enoxaparin (LOVENOX) injection  40 mg Subcutaneous Q24H  . fluconazole (DIFLUCAN) IV  400 mg Intravenous Q24H  . insulin aspart  0-15 Units Subcutaneous Q4H  .  lip balm  1 application Topical BID  . metoprolol  5 mg Intravenous Q6H  . metronidazole  500 mg Intravenous Q6H  . DISCONTD: fluconazole (DIFLUCAN) IV  100 mg Intravenous Q24H  . DISCONTD: HYDROmorphone PCA 0.3 mg/mL   Intravenous Q4H  . DISCONTD: metronidazole  500 mg Intravenous Q8H    Assessment/Plan Perforated diverticulitis with intra-abdominal abscess, s/p 1. Exploratory laparotomy.  Low anterior resection, distal sigmoid colon. Descending colostomy. Incidental appendectomy. 09/08/11  Percutaneous drain LLQ abscess 08/31/11,  drained, multiple abscesses  Post op ileus  C Diff colitis, he was on cipro prior to admission  Plan:  NG removed, start some clears today, He is asking about retesting for C diff so we can get him off isolation.  He has been on flagyl since 8/11, Invanz 8/11-8/20, ceftriaxone from 8/21 thru today.   I will recheck.  If he does well with clears; advance to fulls tomorrw and begin to wean TNA.       LOS: 17 days    [name] 09/13/2011

## 2011-09-14 ENCOUNTER — Encounter (HOSPITAL_COMMUNITY): Payer: Self-pay

## 2011-09-14 LAB — GLUCOSE, CAPILLARY: Glucose-Capillary: 128 mg/dL — ABNORMAL HIGH (ref 70–99)

## 2011-09-14 MED ORDER — ACETAMINOPHEN 325 MG PO TABS: 650.0000 mg | ORAL_TABLET | Freq: Four times a day (QID) | ORAL | Status: DC | PRN

## 2011-09-14 MED ORDER — CLINIMIX E/DEXTROSE (5/20) 5 % IV SOLN
INTRAVENOUS | Status: AC
  Administered 2011-09-14: 12:00:00 via INTRAVENOUS
  Filled 2011-09-14: qty 2000

## 2011-09-14 MED ORDER — OXYCODONE-ACETAMINOPHEN 5-325 MG PO TABS: 1.0000 | ORAL_TABLET | ORAL | Status: DC | PRN

## 2011-09-14 NOTE — Discharge Summary (Signed)
Physician Discharge Summary  Patient ID: [name] MRN: [id number] DOB/AGE: [birth year] 56 y.o.  Admit date: 08/27/2011 Discharge date: 09/15/2011 Primary Care:  Dr. Sharlot Gowda  Admission Diagnoses: Acute sigmoid diverticulitis with microperforation that is fairly focal with a possible early abscess formation Perforated diverticulitis with intra-abdominal abscess, s/p 1. Exploratory laparotomy.  Low anterior resection, distal sigmoid colon. Descending colostomy. Incidental appendectomy. 09/08/11  Percutaneous drain LLQ abscess 08/31/11, drained, multiple abscesses 2nd drain place 09/02/11 95 ml drained.  Post op ileus  C Diff colitis, he was on cipro prior to admission Hx Hypertension medication. CAD  Discharge Diagnoses: Perforated diverticulitis with multiple intra-abdominal abscess. Percutaneous drain LLQ abscess 08/31/11, drained,   2nd drain place 09/02/11 95 ml drained.  Post op ileus  C Diff colitis, he was on cipro prior to admission Hx CAD Hx Hypertension  Principal Problem:  *Diverticulitis of colon with microperforation Active Problems:  C. difficile colitis  Ileus following gastrointestinal surgery   PROCEDURES: Exploratory laparotomy 2. Low anterior resection of sigmoid colon 3. Descending colostomy 4. Incidental appendectomy ,09/08/2011 Velora Heckler, MD  Percutaneous Intraabdominal Drain placements 08/31/11 and 09/02/11 by IR.     Hospital Course: He developed the onset of lower abdominal pain with nausea and vomiting about a week ago. At first he had some fever but then this resolved. He saw his primary care physician multiple times over the last 5 days and was eventually put on ciprofloxacin. However, the pain and nausea vomiting continued and he presented to the emergency department for further evaluation. He had a leukocytosis. Evaluation included a CT scan which demonstrated findings consistent with sigmoid diverticulitis with some foci of air around  the sigmoid area and a small area in the pelvis suspicious for an evolving abscess. He states he's not been able to take any this medication since he got sick and so has not been on his metoprolol. His primary care physician told him that his white blood count was elevated. He has not had anything like this before. He had a colonoscopy at the age of 28 and states nothing suspicious was found. He feels somewhat weak as he is not been able to keep anything down for about a week. He was seen in the ER with Ct showing sigmoid diverticulitis, and possible perforation with 2.1 x 1.6 cm pericolonic abscess. He was admitted and place on bowel rest, hydration and IV antibiotics. He continued having multiple stools, and PCR shows C. Diff Colitis. He also started to develop some mild renal insuffiencey with his  Ongoing diarrhea. Repeat CT scan on 08/31/11 showed an 8 x 11 Cm Abscess consistent with a significant perforation. CT guided drain was placed and he started feeling better. He was on Flagyl for C Diff colitis, and Invanz for broader spectrum coverage. Because of finding multiple abscess, another Ct was done on 8/16 with a second drain placement at that time. He was placed on TNA with a PICC. Repeat Ct on 8/21 shows persistent leak from colon with free and and abscees within the peritoneal cavity.  Oral contrast in the peritoneal space.  He was then taken to the OR by Dr. Gerrit Friends, 09/08/11.  He did well with surgery.  He had a post op ileus and was slow to open up.  He was maintained on IV antibiotics, and diflucan.  One culture grew some candida.  His diet was advanced as his colon function returned.  He was weaned off TNA and by 8/29 he  was anxious for discharge.  He will go home on 5 more day of Augmentin and Diflucan.  He will go to the office next week for staple removal.. Continue wet to dry dressings to open portion of the wound at home.  He is very independent and declined home health  Ostomy is working well,  incision looks good.  He has a mild rise in WBC.  He will follow up as noted below.  Condition on D/C: Improved    Disposition: Home  Discharge Orders    Future Appointments: Provider: Department: Dept Phone: Center:   09/22/2011 3:00 PM [facility] [phone] None     Medication List  As of 09/15/2011  9:03 AM   STOP taking these medications         ciprofloxacin 500 MG tablet         TAKE these medications         acetaminophen 325 MG tablet   Commonly known as: TYLENOL   Take 2 tablets (650 mg total) by mouth every 6 (six) hours as needed.      amoxicillin-clavulanate 875-125 MG per tablet   Commonly known as: AUGMENTIN   Take 1 tablet by mouth every 12 (twelve) hours.      aspirin 81 MG tablet   Take 81 mg by mouth daily.      fluconazole 200 MG tablet   Commonly known as: DIFLUCAN   Take 1 tablet (200 mg total) by mouth daily.      metoprolol succinate 100 MG 24 hr tablet   Commonly known as: TOPROL-XL   Take 50 mg by mouth daily. Take 1/2 with or immediately following a meal.      oxyCODONE-acetaminophen 5-325 MG per tablet   Commonly known as: PERCOCET/ROXICET   Take 1-2 tablets by mouth every 4 (four) hours as needed.           Follow-up Information    Follow up with Velora Heckler, MD on 09/22/2011. (Be at office at 2:30 to see nurse for staple removal..Dr. Gerrit Friends will be in offfice.)    Contact information:   [address] [place] [address] [phone]       Follow up with Velora Heckler, MD. Schedule an appointment as soon as possible for a visit in 2 weeks. (Call and )    Contact information:   [address] [place] [address] [phone]       Follow up with Carollee Herter, MD. (Call and let him see you for follow up and to watch your Blood pressure.  If your blood pressure is low or heart rate is low call Dr. Susann Givens.)    Contact information:   [address] [place] [address] [phone]          Signed: Sherrie George 09/15/2011, 9:03 AM

## 2011-09-14 NOTE — Progress Notes (Signed)
6 Days Post-Op  Subjective: Wants to go home today, but willing to wait.  He has already changed his colostomy, and is doing his wound dressing now.  Objective: Vital signs in last 24 hours: Temp:  [98.2 F (36.8 C)-98.6 F (37 C)] 98.5 F (36.9 C) (08/28 0640) Pulse Rate:  [97-109] 97  (08/28 0640) Resp:  [17-18] 18  (08/28 0640) BP: (113-125)/(80-86) 125/86 mmHg (08/28 0640) SpO2:  [99 %-100 %] 100 % (08/28 0640) Last BM Date: 09/13/11  1440 Po recorded, 1200 thru the ostomy. Diet: clears, wants to go home today, so I have upped him to a low fiber diet. Afebrile, VSS, still has some tachycardia,  Intake/Output from previous day: 08/27 0701 - 08/28 0700 In: 3660 [P.O.:1440; I.V.:480; IV Piggyback:300; TPN:1440] Out: 3020 [Urine:1800; Emesis/NG output:20; Stool:1200] Intake/Output this shift:    General appearance: alert, cooperative and no distress Resp: clear to auscultation bilaterally GI: soft, wounds look good, he has some drainage, but not much, mostly serous.  colostomy working well no nausea.    Lab Results:   [facility] 09/12/11 0610  WBC 9.4  HGB 10.2*  HCT 30.6*  PLT 370    BMET  Basename 09/12/11 0610  NA 135  K 4.2  CL 98  CO2 34*  GLUCOSE 129*  BUN 16  CREATININE 0.85  CALCIUM 8.2*   PT/INR No results found for this basename: LABPROT:2,INR:2 in the last 72 hours   Lab 09/12/11 0610 09/11/11 0715 09/09/11 0315 09/08/11 0631  AST 49* 78* 127* 272*  ALT 99* 118* 179* 233*  ALKPHOS 117 123* 81 93  BILITOT 0.2* 0.3 0.3 0.2*  PROT 5.9* 5.6* 5.5* 5.7*  ALBUMIN 1.8* 1.7* 1.6* 1.8*     Lipase     Component Value Date/Time   LIPASE 59 08/27/2011 1136     Studies/Results: No results found.  Medications:    . cefTRIAXone (ROCEPHIN)  IV  1 g Intravenous Q24H  . enoxaparin (LOVENOX) injection  40 mg Subcutaneous Q24H  . fluconazole (DIFLUCAN) IV  400 mg Intravenous Q24H  . insulin aspart  0-15 Units Subcutaneous Q4H  . lip balm  1  application Topical BID  . metoprolol  5 mg Intravenous Q6H  . metronidazole  500 mg Intravenous Q6H    Assessment/Plan Perforated diverticulitis with intra-abdominal abscess, s/p 1. Exploratory laparotomy.  Low anterior resection, distal sigmoid colon. Descending colostomy. Incidental appendectomy. 09/08/11  Percutaneous drain LLQ abscess 08/31/11, drained, multiple abscesses  Post op ileus  C Diff colitis, he was on cipro prior to admission   Plan;  Advance diet, wean tna, and plan home tomorrow on oral antibiotics. Follow up in clinic for staples next week 09/22/11 @2 :30 PM.   LOS: 18 days    [name] 09/14/2011

## 2011-09-14 NOTE — Progress Notes (Addendum)
PARENTERAL NUTRITION CONSULT NOTE - FOLLOW UP  Pharmacy Consult for TNA   Indication: Prolonged Ileus  No Known Allergies  Patient Measurements: Height: 6\' 2"  (188 cm) Weight: 165 lb 9.1 oz (75.1 kg) IBW/kg (Calculated) : 82.2  Usual Weight: 78.5 kg 08/22/11  Vital Signs: Temp: 98.5 F (36.9 C) (08/28 0640) Temp src: Oral (08/28 0640) BP: 125/86 mmHg (08/28 0640) Pulse Rate: 97  (08/28 0640) Intake/Output from previous day: 08/27 0701 - 08/28 0700 In: 3660 [P.O.:1440; I.V.:480; IV Piggyback:300; TPN:1440] Out: 3020 [Urine:1800; Emesis/NG output:20; Stool:1200] Intake/Output from this shift:    Labs:  [facility] 09/12/11 0610  WBC 9.4  HGB 10.2*  HCT 30.6*  PLT 370  APTT --  INR --     Basename 09/12/11 0610  NA 135  K 4.2  CL 98  CO2 34*  GLUCOSE 129*  BUN 16  CREATININE 0.85  LABCREA --  CREAT24HRUR --  CALCIUM 8.2*  MG 2.2  PHOS 4.1  PROT 5.9*  ALBUMIN 1.8*  AST 49*  ALT 99*  ALKPHOS 117  BILITOT 0.2*  BILIDIR --  IBILI --  PREALBUMIN 18.6  TRIG 123  CHOLHDL --  CHOL 104  Corrected calcium: 9.96 on 8/26  Estimated Creatinine Clearance: 101.9 ml/min (by C-G formula based on Cr of 0.85).   CBGs and Insulin Requirements in past 24 hours:  CBGs <150, requiring 8 units Novolog SSI used (on moderate-scale coverage)  Nutritional Goals:   Per RD recommendation (8/16): 1875-2250 kcal/day, 90-110 gm/day protein   Clinimix-E 5/20 at a goal rate of 80 ml/hr + lipids at 10 ml/hr provides: 96 g/day protein and 2169 Kcal/day MWF, 1689 Kcal/day STTHS (Avg. 1896 Kcal/day weekly).  Current Nutrition:   Diet: Clear Liquid starting 8/27  Clinimix-E 5/20 at 80 mL/hr   Lipids 20% at 10 mL/hr MWF only  mIVF = D5 + NS + KCl 20 mEq/L @ 30 ml/hr  Assessment:  56 y/o M admitted 08/27/11 with N/V and abdominal pain x 1 week, no improvement w/Cipro outpatient. CT revealed diverticulitis with microperforation diverticulitis, possible new abscess, and reactive  ileus. Perc drain of diverticular abscess done by IR 8/14 and second drain placed 8/16  TNA was started 8/17 and advanced to goal 8/19.   Repeat CT 8/21 showed persistent leak from colon with free air and abscesses in peritoneal cavity and pt underwent ex lap with low anterior resection of sigmoid colon, descending colostomy and incidental appendectomy 8/22.   NG removed 8/27.  Ileus resolving. Diet advanced to clear liquid 8/27 - 1440 mL PO intake reported 8/27.  Per MD, if does well with clears, advance to full liquid diet today and begin to wean TNA.   Labs (no new labs 8/28)   Lytes: WNL 8/26  Renal: SCr and BUN WNL 8/26  LFTs: AST and ALT improving (8/26).  Note: AST/ALT were elevated before TNA began.   CBGs: stable  Triglycerides: WNL (8/26)  Prealbumin: improved, wnl (6.8 on 8/17 -->18.6 on 8/26)  Plan:    Awaiting MD assessment/plans, possible wean TNA today.  Geoffry Paradise, PharmD, BCPS Pager: [phone] 8:22 AM Pharmacy #: [phone]   Addendum:   Communicated with Will Genevive Bi to wean TNA to OFF today.   Plan:  Turn current rate of TNA to 40 ml/hr.  At 1800 today, turn TNA to OFF.  Will d/c all TNA labs. Defer fluid adjustment and glucose management to MD.  Pharmacy will sign off.    Geoffry Paradise, PharmD, BCPS Pager:  [phone] 10:26 AM Pharmacy #: [phone]

## 2011-09-14 NOTE — Progress Notes (Signed)
I had already discussed with him about discharge later in the week if he improves.  NGT just came out yesterday.  Yesterday, he was hoping for 2 days = Thursday.  Now he wants to leave today, Wed.  I think that is too soon.  I agree with weaning of TNA.  Advance to a solid diet.  I'm hopeful he can get out tomorrow but I do not want to rush things.  He understands

## 2011-09-14 NOTE — Consult Note (Signed)
WOC ostomy follow up Stoma type/location:  End colostomy, RLQ Stomal assessment/size: 1 3/4" x 2 1/4" oval, budded from skin Peristomal assessment: intact Treatment options for stomal/peristomal skin: none Output liquid brown Ostomy pouching: 2pc. 2 3/4" used to accommodate stoma size.   Education provided: Pt cut new wafer to fit stoma with some assistance with drawing the pattern.  He removed his old pouch independently and placed new wafer.  He attached new pouch to wafer independently.  Has emptied pouch and is independent with lock and roll closure.  Pt is very modivated to care for himself and dc back to home.  Will enroll in [facility] [employer] start dc program, pt consented for same.  Serrena Linderman [place], [place] [phone]

## 2011-09-15 ENCOUNTER — Encounter (HOSPITAL_COMMUNITY): Payer: Self-pay | Admitting: General Surgery

## 2011-09-15 DIAGNOSIS — K9189 Other postprocedural complications and disorders of digestive system: Secondary | ICD-10-CM | POA: Diagnosis not present

## 2011-09-15 DIAGNOSIS — K567 Ileus, unspecified: Secondary | ICD-10-CM | POA: Diagnosis not present

## 2011-09-15 LAB — COMPREHENSIVE METABOLIC PANEL
BUN: 15 mg/dL (ref 6–23)
Calcium: 8 mg/dL — ABNORMAL LOW (ref 8.4–10.5)
Creatinine, Ser: 1.02 mg/dL (ref 0.50–1.35)
GFR calc Af Amer: >90 mL/min (ref >90)
Glucose, Bld: 92 mg/dL (ref 70–99)
Total Protein: 5.9 g/dL — ABNORMAL LOW (ref 6.0–8.3)

## 2011-09-15 LAB — CBC
HCT: 30.1 % — ABNORMAL LOW (ref 39.0–52.0)
Hemoglobin: 10.2 g/dL — ABNORMAL LOW (ref 13.0–17.0)
MCH: 30.9 pg (ref 26.0–34.0)
MCHC: 33.9 g/dL (ref 30.0–36.0)
MCV: 91.2 fL (ref 78.0–100.0)

## 2011-09-15 MED ORDER — FLUCONAZOLE 200 MG PO TABS: 200.0000 mg | ORAL_TABLET | Freq: Every day | ORAL | Status: DC

## 2011-09-15 MED ORDER — ACETAMINOPHEN 325 MG PO TABS: 650.0000 mg | ORAL_TABLET | Freq: Four times a day (QID) | ORAL | Status: DC | PRN

## 2011-09-15 MED ORDER — METOPROLOL SUCCINATE ER 50 MG PO TB24
50.0000 mg | ORAL_TABLET | Freq: Every day | ORAL | Status: DC
  Filled 2011-09-15: qty 1

## 2011-09-15 MED ORDER — AMOXICILLIN-POT CLAVULANATE 875-125 MG PO TABS
1.0000 | ORAL_TABLET | Freq: Two times a day (BID) | ORAL | Status: DC
  Filled 2011-09-15 (×2): qty 1

## 2011-09-15 MED ORDER — FLUCONAZOLE 200 MG PO TABS
200.0000 mg | ORAL_TABLET | Freq: Every day | ORAL | Status: DC
  Filled 2011-09-15: qty 1

## 2011-09-15 MED ORDER — OXYCODONE-ACETAMINOPHEN 5-325 MG PO TABS: 1.0000 | ORAL_TABLET | ORAL | Status: AC | PRN

## 2011-09-15 MED ORDER — AMOXICILLIN-POT CLAVULANATE 875-125 MG PO TABS: 1.0000 | ORAL_TABLET | Freq: Two times a day (BID) | ORAL | Status: AC

## 2011-09-15 NOTE — Progress Notes (Signed)
7 Days Post-Op  Subjective: Tired of hospital and wants to move on. Tolerating PO's well, ostomy mostly liquid, but working well.  Objective: Vital signs in last 24 hours: Temp:  [97.7 F (36.5 C)-98.6 F (37 C)] 97.7 F (36.5 C) (08/29 0612) Pulse Rate:  [87-107] 87  (08/29 0612) Resp:  [18] 18  (08/29 0612) BP: (108-126)/(74-83) 111/78 mmHg (08/29 0612) SpO2:  [100 %] 100 % (08/29 0612) Last BM Date: 09/14/11  840 PO recorded yesterday, 1100 thru colostomy, Diet low fiber, afebrile, VSS,  Intake/Output from previous day: 08/28 0701 - 08/29 0700 In: 2060 [P.O.:840; I.V.:720; IV Piggyback:500] Out: 1100 [Urine:700; Stool:400] Intake/Output this shift:    General appearance: alert, cooperative and no distress ResP: Clear GI: soft, wound, ok minimal darker serous drainage, from open areas, tolerating low fiber, ostomy looks good, and functioning well    Lab Results:   [facility] 09/15/11 0549  WBC 12.0*  HGB 10.2*  HCT 30.1*  PLT 375    BMET  Basename 09/15/11 0549  NA 132*  K 4.3  CL 103  CO2 25  GLUCOSE 92  BUN 15  CREATININE 1.02  CALCIUM 8.0*   PT/INR No results found for this basename: LABPROT:2,INR:2 in the last 72 hours   Lab 09/15/11 0549 09/12/11 0610 09/11/11 0715 09/09/11 0315  AST 45* 49* 78* 127*  ALT 67* 99* 118* 179*  ALKPHOS 106 117 123* 81  BILITOT 0.3 0.2* 0.3 0.3  PROT 5.9* 5.9* 5.6* 5.5*  ALBUMIN 1.9* 1.8* 1.7* 1.6*     Lipase     Component Value Date/Time   LIPASE 59 08/27/2011 1136     Studies/Results: No results found.  Medications:    . cefTRIAXone (ROCEPHIN)  IV  1 g Intravenous Q24H  . enoxaparin (LOVENOX) injection  40 mg Subcutaneous Q24H  . fluconazole (DIFLUCAN) IV  400 mg Intravenous Q24H  . lip balm  1 application Topical BID  . metoprolol  5 mg Intravenous Q6H  . metronidazole  500 mg Intravenous Q6H  . DISCONTD: insulin aspart  0-15 Units Subcutaneous Q4H    Assessment/Plan Perforated diverticulitis  with intra-abdominal abscess, s/p 1. Exploratory laparotomy.  Low anterior resection, distal sigmoid colon. Descending colostomy. Incidental appendectomy. 09/08/11  Percutaneous drain LLQ abscess 08/31/11, drained, multiple abscesses  2nd drain place 09/02/11 95 ml drained. Post op ileus  C Diff colitis, he was on cipro prior to admission   Plan: home on 5 more days of Augmentin and Diflucan.  He will get staples out in office next week, and follow up with Dr. Gerrit Friends after that.     LOS: 19 days    Bianna Haran 09/15/2011

## 2011-09-15 NOTE — Discharge Summary (Signed)
Making progress well this week Motivated to recover & rather independent OK to d/c w close f/u

## 2011-09-15 NOTE — Progress Notes (Signed)
OK to d/c since meeting goals.  Have close f/u

## 2011-09-22 ENCOUNTER — Encounter (INDEPENDENT_AMBULATORY_CARE_PROVIDER_SITE_OTHER): Payer: Self-pay | Admitting: Surgery

## 2011-09-22 ENCOUNTER — Ambulatory Visit (INDEPENDENT_AMBULATORY_CARE_PROVIDER_SITE_OTHER): Payer: Managed Care, Other (non HMO) | Admitting: Surgery

## 2011-09-22 VITALS — BP 114/80 | HR 14 | Temp 97.1°F | Resp 66 | Ht 74.0 in | Wt 151.2 lb

## 2011-09-22 DIAGNOSIS — K5732 Diverticulitis of large intestine without perforation or abscess without bleeding: Secondary | ICD-10-CM

## 2011-09-22 DIAGNOSIS — K5720 Diverticulitis of large intestine with perforation and abscess without bleeding: Secondary | ICD-10-CM

## 2011-09-22 DIAGNOSIS — K648 Other hemorrhoids: Secondary | ICD-10-CM

## 2011-09-22 NOTE — Patient Instructions (Signed)
Continue wet to dry dressing changes as instructed.  Stoma care as instructed.  Remove remaining two staples with next ostomy appliance change.  Velora Heckler, MD, [facility] Office: [phone]

## 2011-09-22 NOTE — Progress Notes (Signed)
General Surgery [facility]  Visit Diagnoses: 1. Prolapsed internal hemorrhoids   2. Diverticulitis of colon with microperforation     HISTORY: Patient is a 56 year old white male who underwent urgent laparotomy for sigmoid colectomy and descending colostomy with perforated sigmoid diverticulitis with abscesses. Postoperative course has been relatively uneventful. He has completed his course of oral antibiotics. He is performing twice-daily dressing changes with wet-to-dry dressings. He is doing well with his ostomy care. He returns today for wound check and staple removal.  EXAM: A midline abdominal incision is healing nicely. Packing is removed. Wounds are clean and granulating. Staples were removed. Wet to dry dressing change is replaced. Stoma appears to be healing nicely in the left mid abdominal wall.  IMPRESSION: Perforated sigmoid diverticulitis with abscess  PLAN: Patient will continue wet to dry dressing changes twice daily. He will return in 3 weeks for wound check.  Velora Heckler, MD, FACS General & Endocrine Surgery [facility]

## 2011-10-10 ENCOUNTER — Encounter (INDEPENDENT_AMBULATORY_CARE_PROVIDER_SITE_OTHER): Payer: Self-pay | Admitting: Surgery

## 2011-10-10 ENCOUNTER — Ambulatory Visit (INDEPENDENT_AMBULATORY_CARE_PROVIDER_SITE_OTHER): Payer: Managed Care, Other (non HMO) | Admitting: Surgery

## 2011-10-10 VITALS — BP 142/86 | HR 82 | Temp 98.0°F | Resp 18 | Ht 74.0 in | Wt 162.0 lb

## 2011-10-10 DIAGNOSIS — K5790 Diverticulosis of intestine, part unspecified, without perforation or abscess without bleeding: Secondary | ICD-10-CM

## 2011-10-10 DIAGNOSIS — K5730 Diverticulosis of large intestine without perforation or abscess without bleeding: Secondary | ICD-10-CM

## 2011-10-10 DIAGNOSIS — K5732 Diverticulitis of large intestine without perforation or abscess without bleeding: Secondary | ICD-10-CM

## 2011-10-10 DIAGNOSIS — K5720 Diverticulitis of large intestine with perforation and abscess without bleeding: Secondary | ICD-10-CM

## 2011-10-10 NOTE — Progress Notes (Deleted)
General Surgery [facility]  Visit Diagnoses: No diagnosis found.  HISTORY: ***  EXAM: ***  IMPRESSION: ***  PLAN: ***  Velora Heckler, MD, FACS General & Endocrine Surgery [facility]

## 2011-10-10 NOTE — Progress Notes (Signed)
General Surgery [facility]  Visit Diagnoses: 1. Diverticular disease   2. Diverticulitis of colon with microperforation     HISTORY: Patient returns for postoperative visit having undergone sigmoid resection and descending colostomy for perforated diverticular disease. Overall he is making an excellent recovery.  EXAM: Midline abdominal incision is nearly completely epithelialized. There is a small suture and the inferior portion of the wound which is excised today.  IMPRESSION: Status post sigmoid colectomy and descending colostomy for perforated diverticular disease  PLAN: Patient will return in 6 weeks. At that time we will begin scheduling him for colostomy closure. He is instructed in local wound care.  Prior to colostomy closure, the patient will require a colonoscopy with examination of the colon and the rectum. He has seen Dr. Jeani Hawking in the past from gastroenterology. We will make arrangements for consultation.  Velora Heckler, MD, FACS General & Endocrine Surgery [facility]

## 2011-10-10 NOTE — Patient Instructions (Signed)
  COCOA BUTTER & VITAMIN E CREAM  ([employer] or other brand)  Apply cocoa butter/vitamin E cream to your incision 2 - 3 times daily.  Massage cream into incision for one minute with each application.  Use sunscreen (50 SPF or higher) for first 6 months after surgery if area is exposed to sun.  You may substitute Mederma or other scar reducing creams as desired.   

## 2011-10-19 ENCOUNTER — Telehealth (INDEPENDENT_AMBULATORY_CARE_PROVIDER_SITE_OTHER): Payer: Self-pay

## 2011-10-19 NOTE — Telephone Encounter (Signed)
Request for ostomy supplies approved and faxed to [facility].

## 2011-11-22 ENCOUNTER — Telehealth (INDEPENDENT_AMBULATORY_CARE_PROVIDER_SITE_OTHER): Payer: Self-pay | Admitting: General Surgery

## 2011-11-22 NOTE — Telephone Encounter (Signed)
Patient calling status post colostomy 09/08/2011 complaining of sharp lower abdominal pain for last 2 days. Pain is constant but worse at night. He states ostomy output in less but not by much and hasn't changed in consistency. He is drinking plenty of fluids. Eating okay. No nausea/vomiting. No fevers. He has an appt next Tuesday. Please advise if any action needed prior to appt.

## 2011-11-23 ENCOUNTER — Telehealth (INDEPENDENT_AMBULATORY_CARE_PROVIDER_SITE_OTHER): Payer: Self-pay | Admitting: General Surgery

## 2011-11-23 NOTE — Telephone Encounter (Signed)
Pt called to follow up on yesterdays call regarding pain.  Paged and updated Dr. Gerrit Friends, who states he will not order any labs or other tests, but will evaluate at appt next week.  Pt is afebrile, good po intake, no change in colostomy output.  The pain is described as a "pinching inside."  Suggested he can try Aleve or Ibuprofen for the discomfort and he agrees.

## 2011-11-29 ENCOUNTER — Encounter (INDEPENDENT_AMBULATORY_CARE_PROVIDER_SITE_OTHER): Payer: Self-pay | Admitting: Surgery

## 2011-11-29 ENCOUNTER — Other Ambulatory Visit (INDEPENDENT_AMBULATORY_CARE_PROVIDER_SITE_OTHER): Payer: Self-pay | Admitting: Surgery

## 2011-11-29 ENCOUNTER — Ambulatory Visit (INDEPENDENT_AMBULATORY_CARE_PROVIDER_SITE_OTHER): Payer: Managed Care, Other (non HMO) | Admitting: Surgery

## 2011-11-29 VITALS — BP 124/86 | HR 84 | Temp 97.2°F | Resp 20 | Ht 74.0 in | Wt 166.6 lb

## 2011-11-29 DIAGNOSIS — K5732 Diverticulitis of large intestine without perforation or abscess without bleeding: Secondary | ICD-10-CM

## 2011-11-29 DIAGNOSIS — K5720 Diverticulitis of large intestine with perforation and abscess without bleeding: Secondary | ICD-10-CM

## 2011-11-29 NOTE — Progress Notes (Signed)
General Surgery [facility]  Visit Diagnoses: 1. Diverticulitis of colon with microperforation     HISTORY: Patient is a 56 year old white male who underwent a Hartman's resection in August 2013 for perforated diverticular disease. Postoperative course was complicated by C. Difficile colitis. He had an open abdominal wound. Patient has recovered nicely. He underwent colonoscopy 3 weeks ago which showed a few small benign polyps which were resected. He is now prepared for colostomy closure.  PERTINENT REVIEW OF SYSTEMS: Occasional sharp pain below the level of the colostomy in the left lower quadrant of the abdomen. Normal colostomy function. No rectal drainage.  EXAM: HEENT: normocephalic; pupils equal and reactive; sclerae clear; dentition good; mucous membranes moist NECK:  symmetric on extension; no palpable anterior or posterior cervical lymphadenopathy; no supraclavicular masses; no tenderness CHEST: clear to auscultation bilaterally without rales, rhonchi, or wheezes CARDIAC: regular rate and rhythm without significant murmur; peripheral pulses are full ABDOMEN: Midline abdominal cyst incision well healed and epithelialized; stoma left lower quadrant normal without evidence of parastomal hernia EXT:  non-tender without edema; no deformity NEURO: no gross focal deficits; no sign of tremor   IMPRESSION: Diverticular disease with microperforation, status post Hartman's resection  PLAN: Patient and I discussed colostomy closure. We discussed the location of the surgical wound. We discussed the anticipated hospital stay. He understands and wishes to proceed. We will make arrangements for surgery in December 2013 at a time convenient for the patient.  The risks and benefits of the procedure have been discussed at length with the patient.  The patient understands the proposed procedure, potential alternative treatments, and the course of recovery to be expected.  All  of the patient's questions have been answered at this time.  The patient wishes to proceed with surgery.  Velora Heckler, MD, [facility] Office: [phone]

## 2011-11-29 NOTE — Patient Instructions (Signed)
[facility]  OPEN ABDOMINAL SURGERY: POST OP INSTRUCTIONS  Always review your discharge instruction sheet given to you by the facility where your surgery was performed.  1. A prescription for pain medication may be given to you upon discharge.  Take your pain medication as prescribed.  If narcotic pain medicine is not needed, then you may take acetaminophen (Tylenol) or ibuprofen (Advil) as needed. 2. Take your usually prescribed medications unless otherwise directed. 3. If you need a refill on your pain medication, please contact your pharmacy. They will contact our office to request authorization.  Prescriptions will not be filled after 5 pm or on weekends. 4. You should follow a light diet the first few days after arrival home, such as soup and crackers, unless your doctor has advised otherwise. A high-fiber, low fat diet can be resumed as tolerated.  Be sure to include plenty of fluids daily.  5. Most patients will experience some swelling and bruising in the area of the incision. Ice packs will help. Swelling and bruising can take several days to resolve. 6. It is common to experience some constipation if taking pain medication after surgery.  Increasing fluid intake and taking a stool softener will usually help or prevent this problem from occurring.  A mild laxative (Milk of Magnesia or Miralax) should be taken according to package directions if there are no bowel movements after 48 hours. 7.  You may have steri-strips (small skin tapes) in place directly over the incision.  These strips should be left on the skin for 7-10 days.  If your surgeon used skin glue on the incision, you may shower in 24 hours.  The glue will flake off over the next 2-3 weeks.  Any sutures or staples will be removed at the office during your follow-up visit. You may find that a light gauze bandage over your incision may keep your staples from being rubbed or pulled. You may shower and replace the bandage  daily. 8. ACTIVITIES:  You may resume regular (light) daily activities beginning the next day-such as daily self-care, walking, climbing stairs-gradually increasing activities as tolerated.  You may have sexual intercourse when it is comfortable.  Refrain from any heavy lifting or straining until approved by your doctor.  You may drive when you no longer are taking prescription pain medication, you can comfortably wear a seatbelt, and you can safely maneuver your car and apply brakes. 9. You should see your doctor in the office for a follow-up appointment approximately two weeks after your surgery.  Make sure that you call for this appointment within a day or two after you arrive home to insure a convenient appointment time.  WHEN TO CALL YOUR DOCTOR: 1. Fever greater than 101.0 2. Inability to urinate 3. Persistent nausea and/or vomiting 4. Extreme swelling or bruising 5. Continued bleeding from incision 6. Increased pain, redness, or drainage from the incision 7. Difficulty swallowing or breathing 8. Muscle cramping or spasms 9. Numbness or tingling in hands or around lips  IF YOU HAVE DISABILITY OR FAMILY LEAVE FORMS, YOU MUST BRING THEM TO THE OFFICE FOR PROCESSING.  PLEASE DO NOT GIVE THEM TO YOUR DOCTOR.  The clinic staff is available to answer your questions during regular business hours.  Please don't hesitate to call and ask to speak to one of the nurses if you have concerns.  [facility] Office: [phone]  For further questions, please visit [email or web address]   

## 2011-12-23 ENCOUNTER — Encounter (HOSPITAL_COMMUNITY): Payer: Self-pay | Admitting: Pharmacy Technician

## 2011-12-26 ENCOUNTER — Ambulatory Visit (HOSPITAL_COMMUNITY)
Admission: RE | Admit: 2011-12-26 | Discharge: 2011-12-26 | Disposition: A | Discharge status: Home / Self Care | Payer: Managed Care, Other (non HMO) | Source: Ambulatory Visit | Attending: Anesthesiology | Admitting: Anesthesiology

## 2011-12-26 ENCOUNTER — Encounter (HOSPITAL_COMMUNITY): Payer: Self-pay

## 2011-12-26 ENCOUNTER — Encounter (HOSPITAL_COMMUNITY)
Admission: RE | Admit: 2011-12-26 | Discharge: 2011-12-26 | Disposition: A | Discharge status: Home / Self Care | Payer: Managed Care, Other (non HMO) | Source: Ambulatory Visit | Attending: Surgery | Admitting: Surgery

## 2011-12-26 DIAGNOSIS — Z933 Colostomy status: Secondary | ICD-10-CM | POA: Insufficient documentation

## 2011-12-26 DIAGNOSIS — Z01818 Encounter for other preprocedural examination: Secondary | ICD-10-CM | POA: Insufficient documentation

## 2011-12-26 LAB — SURGICAL PCR SCREEN
MRSA, PCR: NEGATIVE
Staphylococcus aureus: NEGATIVE

## 2011-12-26 LAB — BASIC METABOLIC PANEL
BUN: 13 mg/dL (ref 6–23)
Chloride: 105 mEq/L (ref 96–112)
GFR calc Af Amer: 78 mL/min — ABNORMAL LOW (ref >90)
GFR calc non Af Amer: 68 mL/min — ABNORMAL LOW (ref >90)
Potassium: 4.6 mEq/L (ref 3.5–5.1)
Sodium: 140 mEq/L (ref 135–145)

## 2011-12-26 LAB — CBC
HCT: 45 % (ref 39.0–52.0)
Hemoglobin: 15.6 g/dL (ref 13.0–17.0)
MCHC: 34.7 g/dL (ref 30.0–36.0)
WBC: 7.9 10*3/uL (ref 4.0–10.5)

## 2011-12-26 NOTE — Progress Notes (Signed)
Quick Note:  These results are acceptable for scheduled surgery.  Aviance Cooperwood M. Jaxston Chohan, MD, FACS [facility] Office: [phone]   ______ 

## 2011-12-26 NOTE — Pre-Procedure Instructions (Addendum)
20 CORION SHERROD  12/26/2011   Your procedure is scheduled on:  01/03/12  Report to [facility] at 530 AM.  Call this number if you have problems the morning of surgery: [phone]   Remember:   Do not eat food or drink:After Midnight.  .  Take these medicines the morning of surgery with A SIP OF WATER: metoprolol,        tylenol if needed      STOP multi vit, omega 3 tomorrow aspirin   Do not wear jewelry,   Do not wear lotions, powders, or perfumes. You may wear not  deodorant.  Do not shave 48 hours prior to surgery. Men may shave face and neck.  Do not bring valuables to the hospital.  Contacts, dentures or bridgework may not be worn into surgery.  Leave suitcase in the car. After surgery it may be brought to your room.  For patients admitted to the hospital, checkout time is 11:00 AM the day of discharge.   Patients discharged the day of surgery will not be allowed to drive home.  Name and phone number of your driver: [phone]  Special Instructions: Shower using CHG 2 nights before surgery and the night before surgery.  If you shower the day of surgery use CHG.  Use special wash - you have one bottle of CHG for all showers.  You should use approximately 1/3 of the bottle for each shower.   Please read over the following fact sheets that you were given: Pain Booklet, Coughing and Deep Breathing, MRSA Information and Surgical Site Infection Prevention

## 2011-12-26 NOTE — Progress Notes (Signed)
Echo 7/05,06

## 2011-12-27 ENCOUNTER — Encounter (HOSPITAL_COMMUNITY): Payer: Self-pay | Admitting: Vascular Surgery

## 2011-12-27 NOTE — Consult Note (Addendum)
Anesthesia chart review: Patient is a 56 year old male scheduled for colostomy takedown on 01/03/2012 by Dr. Gerrit Friends. History includes exploratory laparotomy with low anterior resection, distal sigmoid colon, descending colostomy, incidental appendectomy on 09/08/2011 for perforated diverticulitis.  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 difficile colitis and ileus following abdominal surgery in August 2013.   PCP is Dr. Sharlot Gowda.  His PAT RN reports that he is no longer followed by a cardiologist, but has seen Dr. Elease Hashimoto in the past.     EKG on 12/26/11 showed NSR, possible LAE, poor r wave progression, non-specific ST/T wave changed.  It was not felt significantly changed since his previous EKG on 08/28/11, although the rate is slower now.  Echocardiogram on 07/26/04 [facility]) showed normal LV function, the mitral valve leaflets are thickened, mild to moderate mitral regurgitation, the LV is smaller compared to his previous echocardiogram.  EF 55-60%.  Cardiac cath on 10/01/03 showed smooth and normal coronary arteries. Mildly enlarged left ventricle, but with well-preserved left ventricular systolic function, moderate mitral regurgitation with evidence of mitral valve prolapse.  Chest x-ray on 12/26/2011 showed changes related to COPD, no active cardiopulmonary disease.  Pre-operative labs noted.  I reviewed above with Anesthesiologist Dr. Michelle Piper.  If patient is asymptomatic from a CV/CHF standpoint then anticipate he can proceed as planned.  I have left a voicemail for Mr. Kettering to call me.  Shonna Chock, PA-C 12/27/11 1212  Addendum: 12/28/11 1255 I spoke with Mr. [place].  He reports that he still sees Dr. Elease Hashimoto on a yearly basis.  He also thinks that he has had another echo  since 2006, but still several years ago.  Epic does list an Encounter with Dr. Elease Hashimoto from 01/2010, but there is no note associated with this encounter.  (This visit may have been prior to [facility] merging with [facility] which could explain why the note is not scanned into Epic.)  I spoke with Selena Batten in [facility] medical records.  She could not locate any additional cardiology notes or echocardiograms.  He reports that he is doing well from a cardiac standpoint, and denies any known cardiac issues, endocarditis, etc since his MV repair.  He denies chest pain, SOB, edema.  He considers himself very active and is able to play walk 18 holes when playing golf.  Subjectively, he appears stable from a CV standpoint.  Based on his lack of CV symptoms and my conversation with Dr. Michelle Piper, I anticipate he can proceed as planned from an anesthesia standpoint if no new changes.

## 2012-01-02 MED ORDER — SODIUM CHLORIDE 0.9 % IV SOLN
1.0000 g | INTRAVENOUS | Status: AC
  Administered 2012-01-03: 1 g via INTRAVENOUS
  Filled 2012-01-02: qty 1

## 2012-01-03 ENCOUNTER — Encounter (HOSPITAL_COMMUNITY): Payer: Self-pay | Admitting: Vascular Surgery

## 2012-01-03 ENCOUNTER — Encounter (HOSPITAL_COMMUNITY): Payer: Self-pay | Admitting: *Deleted

## 2012-01-03 ENCOUNTER — Ambulatory Visit (HOSPITAL_COMMUNITY): Payer: Managed Care, Other (non HMO) | Admitting: Vascular Surgery

## 2012-01-03 ENCOUNTER — Encounter (HOSPITAL_COMMUNITY)
Admission: RE | Disposition: A | Discharge status: Home / Self Care | Payer: Self-pay | Source: Ambulatory Visit | Attending: Surgery

## 2012-01-03 ENCOUNTER — Inpatient Hospital Stay (HOSPITAL_COMMUNITY)
Admission: RE | Admit: 2012-01-03 | Discharge: 2012-01-07 | DRG: 330 | Disposition: A | Discharge status: Home / Self Care | Payer: Managed Care, Other (non HMO) | Source: Ambulatory Visit | Attending: Surgery | Admitting: Surgery

## 2012-01-03 DIAGNOSIS — J4489 Other specified chronic obstructive pulmonary disease: Secondary | ICD-10-CM | POA: Diagnosis present

## 2012-01-03 DIAGNOSIS — I10 Essential (primary) hypertension: Secondary | ICD-10-CM | POA: Diagnosis present

## 2012-01-03 DIAGNOSIS — I2510 Atherosclerotic heart disease of native coronary artery without angina pectoris: Secondary | ICD-10-CM | POA: Diagnosis present

## 2012-01-03 DIAGNOSIS — Z9049 Acquired absence of other specified parts of digestive tract: Secondary | ICD-10-CM

## 2012-01-03 DIAGNOSIS — Z79899 Other long term (current) drug therapy: Secondary | ICD-10-CM

## 2012-01-03 DIAGNOSIS — Z9089 Acquired absence of other organs: Secondary | ICD-10-CM

## 2012-01-03 DIAGNOSIS — Z433 Encounter for attention to colostomy: Principal | ICD-10-CM

## 2012-01-03 DIAGNOSIS — Z8719 Personal history of other diseases of the digestive system: Secondary | ICD-10-CM

## 2012-01-03 DIAGNOSIS — K5720 Diverticulitis of large intestine with perforation and abscess without bleeding: Secondary | ICD-10-CM | POA: Diagnosis present

## 2012-01-03 DIAGNOSIS — K560 Paralytic ileus: Secondary | ICD-10-CM | POA: Diagnosis not present

## 2012-01-03 DIAGNOSIS — Z7982 Long term (current) use of aspirin: Secondary | ICD-10-CM

## 2012-01-03 DIAGNOSIS — J449 Chronic obstructive pulmonary disease, unspecified: Secondary | ICD-10-CM | POA: Diagnosis present

## 2012-01-03 HISTORY — PX: COLOSTOMY CLOSURE: SHX1381

## 2012-01-03 LAB — CBC
MCH: 30.1 pg (ref 26.0–34.0)
MCHC: 34 g/dL (ref 30.0–36.0)
MCV: 88.6 fL (ref 78.0–100.0)
Platelets: 242 10*3/uL (ref 150–400)
RDW: 13.3 % (ref 11.5–15.5)
WBC: 18.5 10*3/uL — ABNORMAL HIGH (ref 4.0–10.5)

## 2012-01-03 LAB — CREATININE, SERUM: GFR calc Af Amer: 88 mL/min — ABNORMAL LOW (ref >90)

## 2012-01-03 SURGERY — COLOSTOMY CLOSURE
Anesthesia: General | Site: Abdomen | Wound class: Clean Contaminated

## 2012-01-03 MED ORDER — MIDAZOLAM HCL 5 MG/5ML IJ SOLN
INTRAMUSCULAR | Status: DC | PRN
  Administered 2012-01-03: 2 mg via INTRAVENOUS

## 2012-01-03 MED ORDER — LIDOCAINE HCL (CARDIAC) 20 MG/ML IV SOLN
INTRAVENOUS | Status: DC | PRN
  Administered 2012-01-03: 100 mg via INTRAVENOUS

## 2012-01-03 MED ORDER — ACETAMINOPHEN 10 MG/ML IV SOLN: 1000.0000 mg | Freq: Once | INTRAVENOUS | Status: DC

## 2012-01-03 MED ORDER — DIPHENHYDRAMINE HCL 12.5 MG/5ML PO ELIX
12.5000 mg | ORAL_SOLUTION | Freq: Four times a day (QID) | ORAL | Status: DC | PRN
  Filled 2012-01-03: qty 5

## 2012-01-03 MED ORDER — ONDANSETRON HCL 4 MG/2ML IJ SOLN
INTRAMUSCULAR | Status: DC | PRN
  Administered 2012-01-03: 4 mg via INTRAVENOUS

## 2012-01-03 MED ORDER — 0.9 % SODIUM CHLORIDE (POUR BTL) OPTIME
TOPICAL | Status: DC | PRN
  Administered 2012-01-03 (×3): 1000 mL

## 2012-01-03 MED ORDER — HYDROMORPHONE HCL PF 1 MG/ML IJ SOLN
0.2500 mg | INTRAMUSCULAR | Status: DC | PRN
  Administered 2012-01-03 (×3): 0.5 mg via INTRAVENOUS

## 2012-01-03 MED ORDER — ACETAMINOPHEN 10 MG/ML IV SOLN
INTRAVENOUS | Status: DC | PRN
  Administered 2012-01-03: 1000 mg via INTRAVENOUS

## 2012-01-03 MED ORDER — DEXTROSE 5 % IV SOLN
INTRAVENOUS | Status: DC | PRN
  Administered 2012-01-03 (×2): via INTRAVENOUS

## 2012-01-03 MED ORDER — ARTIFICIAL TEARS OP OINT
TOPICAL_OINTMENT | OPHTHALMIC | Status: DC | PRN
  Administered 2012-01-03: 1 via OPHTHALMIC

## 2012-01-03 MED ORDER — BIOTENE DRY MOUTH MT LIQD
15.0000 mL | Freq: Two times a day (BID) | OROMUCOSAL | Status: DC
  Administered 2012-01-03 – 2012-01-05 (×3): 15 mL via OROMUCOSAL

## 2012-01-03 MED ORDER — METOPROLOL SUCCINATE ER 50 MG PO TB24
50.0000 mg | ORAL_TABLET | Freq: Every day | ORAL | Status: DC
  Administered 2012-01-04 – 2012-01-07 (×4): 50 mg via ORAL
  Filled 2012-01-03 (×5): qty 1

## 2012-01-03 MED ORDER — OXYCODONE HCL 5 MG/5ML PO SOLN: 5.0000 mg | Freq: Once | ORAL | Status: DC | PRN

## 2012-01-03 MED ORDER — ALVIMOPAN 12 MG PO CAPS
12.0000 mg | ORAL_CAPSULE | ORAL | Status: AC
  Administered 2012-01-03: 12 mg via ORAL
  Filled 2012-01-03: qty 1

## 2012-01-03 MED ORDER — NEOSTIGMINE METHYLSULFATE 1 MG/ML IJ SOLN
INTRAMUSCULAR | Status: DC | PRN
  Administered 2012-01-03: 3 mg via INTRAVENOUS

## 2012-01-03 MED ORDER — HYDROMORPHONE HCL PF 1 MG/ML IJ SOLN
INTRAMUSCULAR | Status: AC
  Filled 2012-01-03: qty 2

## 2012-01-03 MED ORDER — FENTANYL CITRATE 0.05 MG/ML IJ SOLN
INTRAMUSCULAR | Status: DC | PRN
  Administered 2012-01-03: 100 ug via INTRAVENOUS
  Administered 2012-01-03: 50 ug via INTRAVENOUS
  Administered 2012-01-03 (×2): 100 ug via INTRAVENOUS
  Administered 2012-01-03: 50 ug via INTRAVENOUS
  Administered 2012-01-03: 100 ug via INTRAVENOUS

## 2012-01-03 MED ORDER — ACETAMINOPHEN 10 MG/ML IV SOLN
INTRAVENOUS | Status: AC
  Filled 2012-01-03: qty 100

## 2012-01-03 MED ORDER — HYDROMORPHONE 0.3 MG/ML IV SOLN
INTRAVENOUS | Status: DC
  Administered 2012-01-03: 1.5 mg via INTRAVENOUS
  Administered 2012-01-03: 23 mL via INTRAVENOUS
  Administered 2012-01-03: 1.89 mg via INTRAVENOUS
  Administered 2012-01-04: 1.2 mg via INTRAVENOUS
  Administered 2012-01-04: 05:00:00 via INTRAVENOUS
  Administered 2012-01-04: 1.4 mg via INTRAVENOUS
  Administered 2012-01-04: 3 mg via INTRAVENOUS
  Administered 2012-01-04: 1.5 mg via INTRAVENOUS
  Administered 2012-01-05 (×2): 0.3 mg via INTRAVENOUS
  Administered 2012-01-06: 0.6 mg via INTRAVENOUS
  Administered 2012-01-06 (×3): 0.3 mg via INTRAVENOUS
  Filled 2012-01-03: qty 25

## 2012-01-03 MED ORDER — ONDANSETRON HCL 4 MG/2ML IJ SOLN: 4.0000 mg | Freq: Four times a day (QID) | INTRAMUSCULAR | Status: DC | PRN

## 2012-01-03 MED ORDER — ONDANSETRON HCL 4 MG PO TABS
4.0000 mg | ORAL_TABLET | Freq: Four times a day (QID) | ORAL | Status: DC | PRN
  Administered 2012-01-04: 4 mg via ORAL
  Filled 2012-01-03: qty 1

## 2012-01-03 MED ORDER — MEPERIDINE HCL 25 MG/ML IJ SOLN: 6.2500 mg | INTRAMUSCULAR | Status: DC | PRN

## 2012-01-03 MED ORDER — ENOXAPARIN SODIUM 40 MG/0.4ML ~~LOC~~ SOLN
40.0000 mg | SUBCUTANEOUS | Status: DC
  Administered 2012-01-04 – 2012-01-06 (×3): 40 mg via SUBCUTANEOUS
  Filled 2012-01-03 (×5): qty 0.4

## 2012-01-03 MED ORDER — GLYCOPYRROLATE 0.2 MG/ML IJ SOLN
INTRAMUSCULAR | Status: DC | PRN
  Administered 2012-01-03: 0.4 mg via INTRAVENOUS

## 2012-01-03 MED ORDER — HYDROMORPHONE 0.3 MG/ML IV SOLN
INTRAVENOUS | Status: AC
  Filled 2012-01-03: qty 25

## 2012-01-03 MED ORDER — SODIUM CHLORIDE 0.9 % IJ SOLN: 9.0000 mL | INTRAMUSCULAR | Status: DC | PRN

## 2012-01-03 MED ORDER — PROMETHAZINE HCL 25 MG/ML IJ SOLN: 6.2500 mg | INTRAMUSCULAR | Status: DC | PRN

## 2012-01-03 MED ORDER — KCL IN DEXTROSE-NACL 30-5-0.45 MEQ/L-%-% IV SOLN
INTRAVENOUS | Status: DC
  Administered 2012-01-03 – 2012-01-04 (×3): via INTRAVENOUS
  Administered 2012-01-04: 100 mL/h via INTRAVENOUS
  Administered 2012-01-05 (×2): via INTRAVENOUS
  Administered 2012-01-06: 1000 mL via INTRAVENOUS
  Filled 2012-01-03 (×12): qty 1000

## 2012-01-03 MED ORDER — NALOXONE HCL 0.4 MG/ML IJ SOLN: 0.4000 mg | INTRAMUSCULAR | Status: DC | PRN

## 2012-01-03 MED ORDER — OXYCODONE HCL 5 MG PO TABS: 5.0000 mg | ORAL_TABLET | Freq: Once | ORAL | Status: DC | PRN

## 2012-01-03 MED ORDER — CHLORHEXIDINE GLUCONATE 0.12 % MT SOLN
15.0000 mL | Freq: Two times a day (BID) | OROMUCOSAL | Status: DC
  Administered 2012-01-04 – 2012-01-05 (×4): 15 mL via OROMUCOSAL
  Filled 2012-01-03 (×5): qty 15

## 2012-01-03 MED ORDER — DIPHENHYDRAMINE HCL 50 MG/ML IJ SOLN: 12.5000 mg | Freq: Four times a day (QID) | INTRAMUSCULAR | Status: DC | PRN

## 2012-01-03 MED ORDER — PROPOFOL 10 MG/ML IV BOLUS
INTRAVENOUS | Status: DC | PRN
  Administered 2012-01-03: 190 mg via INTRAVENOUS

## 2012-01-03 MED ORDER — ONDANSETRON HCL 4 MG/2ML IJ SOLN
4.0000 mg | Freq: Four times a day (QID) | INTRAMUSCULAR | Status: DC | PRN
  Administered 2012-01-04 – 2012-01-05 (×2): 4 mg via INTRAVENOUS
  Filled 2012-01-03 (×2): qty 2

## 2012-01-03 MED ORDER — LABETALOL HCL 5 MG/ML IV SOLN
INTRAVENOUS | Status: DC | PRN
  Administered 2012-01-03 (×4): 5 mg via INTRAVENOUS

## 2012-01-03 MED ORDER — ROCURONIUM BROMIDE 100 MG/10ML IV SOLN
INTRAVENOUS | Status: DC | PRN
  Administered 2012-01-03: 10 mg via INTRAVENOUS
  Administered 2012-01-03: 50 mg via INTRAVENOUS
  Administered 2012-01-03: 20 mg via INTRAVENOUS

## 2012-01-03 MED ORDER — LACTATED RINGERS IV SOLN
INTRAVENOUS | Status: DC | PRN
  Administered 2012-01-03 (×3): via INTRAVENOUS

## 2012-01-03 SURGICAL SUPPLY — 60 items
BLADE SURG ROTATE 9660 (MISCELLANEOUS) ×1 IMPLANT
CANISTER SUCTION 2500CC (MISCELLANEOUS) ×2 IMPLANT
CLIP TI LARGE 6 (CLIP) ×1 IMPLANT
CLIP TI MEDIUM 6 (CLIP) IMPLANT
CLOTH BEACON ORANGE TIMEOUT ST (SAFETY) ×2 IMPLANT
COVER SURGICAL LIGHT HANDLE (MISCELLANEOUS) ×2 IMPLANT
DRAPE LAPAROSCOPIC ABDOMINAL (DRAPES) ×2 IMPLANT
DRAPE PROXIMA HALF (DRAPES) ×3 IMPLANT
DRAPE STERI WOUND 35X35 8 5/8 (DRAPES) IMPLANT
DRAPE UTILITY 15X26 W/TAPE STR (DRAPE) ×2 IMPLANT
DRAPE WARM FLUID 44X44 (DRAPE) ×2 IMPLANT
ELECT BLADE 6.5 EXT (BLADE) ×1 IMPLANT
ELECT REM PT RETURN 9FT ADLT (ELECTROSURGICAL) ×2
ELECTRODE REM PT RTRN 9FT ADLT (ELECTROSURGICAL) ×1 IMPLANT
GAUZE SPONGE 4X4 16PLY XRAY LF (GAUZE/BANDAGES/DRESSINGS) IMPLANT
GLOVE BIO SURGEON STRL SZ 6.5 (GLOVE) ×1 IMPLANT
GLOVE BIO SURGEON STRL SZ7.5 (GLOVE) ×3 IMPLANT
GLOVE BIOGEL PI IND STRL 6.5 (GLOVE) IMPLANT
GLOVE BIOGEL PI IND STRL 7.5 (GLOVE) IMPLANT
GLOVE BIOGEL PI IND STRL 8 (GLOVE) IMPLANT
GLOVE BIOGEL PI INDICATOR 6.5 (GLOVE) ×3
GLOVE BIOGEL PI INDICATOR 7.5 (GLOVE) ×1
GLOVE BIOGEL PI INDICATOR 8 (GLOVE) ×2
GLOVE SURG ORTHO 8.0 STRL STRW (GLOVE) ×3 IMPLANT
GOWN EXTRA PROTECTION XL (GOWNS) ×1 IMPLANT
GOWN STRL NON-REIN LRG LVL3 (GOWN DISPOSABLE) ×4 IMPLANT
GOWN STRL REIN XL XLG (GOWN DISPOSABLE) ×4 IMPLANT
KIT BASIN OR (CUSTOM PROCEDURE TRAY) ×2 IMPLANT
KIT ROOM TURNOVER OR (KITS) ×2 IMPLANT
LEGGING LITHOTOMY PAIR STRL (DRAPES) ×1 IMPLANT
LIGASURE IMPACT 36 18CM CVD LR (INSTRUMENTS) ×1 IMPLANT
NS IRRIG 1000ML POUR BTL (IV SOLUTION) ×4 IMPLANT
PACK GENERAL/GYN (CUSTOM PROCEDURE TRAY) ×2 IMPLANT
PAD ARMBOARD 7.5X6 YLW CONV (MISCELLANEOUS) ×4 IMPLANT
SPECIMEN JAR X LARGE (MISCELLANEOUS) ×1 IMPLANT
SPONGE GAUZE 4X4 12PLY (GAUZE/BANDAGES/DRESSINGS) ×3 IMPLANT
SPONGE LAP 18X18 X RAY DECT (DISPOSABLE) ×1 IMPLANT
STAPLER CIRC CVD 29MM 37CM (STAPLE) ×2 IMPLANT
STAPLER VISISTAT 35W (STAPLE) ×1 IMPLANT
SUCTION POOLE TIP (SUCTIONS) ×1 IMPLANT
SUT CHROMIC 3 0 SH 27 (SUTURE) IMPLANT
SUT NOVA 1 T20/GS 25DT (SUTURE) ×6 IMPLANT
SUT PDS AB 1 CTX 36 (SUTURE) IMPLANT
SUT PROLENE 2 0 SH 30 (SUTURE) IMPLANT
SUT PROLENE 3 0 SH 48 (SUTURE) ×1 IMPLANT
SUT SILK 2 0 (SUTURE) ×2
SUT SILK 2 0 SH CR/8 (SUTURE) ×2 IMPLANT
SUT SILK 2-0 18XBRD TIE 12 (SUTURE) ×3 IMPLANT
SUT SILK 3 0 (SUTURE) ×2
SUT SILK 3 0 SH CR/8 (SUTURE) ×4 IMPLANT
SUT SILK 3-0 18XBRD TIE 12 (SUTURE) ×3 IMPLANT
SUT VIC AB 3-0 SH 18 (SUTURE) ×1 IMPLANT
TAPE CLOTH SURG 4X10 WHT LF (GAUZE/BANDAGES/DRESSINGS) ×1 IMPLANT
TOWEL OR 17X24 6PK STRL BLUE (TOWEL DISPOSABLE) ×1 IMPLANT
TOWEL OR 17X26 10 PK STRL BLUE (TOWEL DISPOSABLE) ×2 IMPLANT
TRAY FOLEY CATH 14FRSI W/METER (CATHETERS) ×1 IMPLANT
TRAY PROCTOSCOPIC FIBER OPTIC (SET/KITS/TRAYS/PACK) ×1 IMPLANT
UNDERPAD 30X30 INCONTINENT (UNDERPADS AND DIAPERS) ×1 IMPLANT
WATER STERILE IRR 1000ML POUR (IV SOLUTION) ×1 IMPLANT
YANKAUER SUCT BULB TIP NO VENT (SUCTIONS) ×2 IMPLANT

## 2012-01-03 NOTE — Anesthesia Preprocedure Evaluation (Addendum)
Anesthesia Evaluation  Patient identified by MRN, date of birth, ID band Patient awake    Reviewed: Allergy & Precautions, H&P , NPO status , Patient's Chart, lab work & pertinent test results  Airway Mallampati: II TM Distance: >3 FB Neck ROM: Full    Dental No notable dental hx. (+) Teeth Intact and Dental Advisory Given   Pulmonary neg pulmonary ROS,   CHEST - 2 VIEW   Comparison: 09/11/2011   Findings: Right PICC and NG tube removed.  Lungs remain hyperaerated.  Normal heart size.  Clear lungs.  No pneumothorax or pleural effusion.   IMPRESSION: Changes related to COPD are noted.  No active cardiopulmonary disease.  breath sounds clear to auscultation  Pulmonary exam normal       Cardiovascular hypertension, Pt. on medications and Pt. on home beta blockers - CAD negative cardio ROS  + Valvular Problems/Murmurs MVP Rhythm:Regular Rate:Normal  26-Dec-2011 13:44:13 [facility] ROUTINE RECORD Normal sinus rhythm Possible Left atrial enlargement Poor R wave progression Non-specific ST-t changes since last tracing no significant change  2005 had MVRepair by Dr. Tyrone Sage.  Pt denies any cardiac probs. now   Neuro/Psych negative neurological ROS  negative psych ROS   GI/Hepatic negative GI ROS, Neg liver ROS,   Endo/Other  negative endocrine ROS  Renal/GU negative Renal ROS     Musculoskeletal negative musculoskeletal ROS (+)   Abdominal   Peds negative pediatric ROS (+)  Hematology negative hematology ROS (+)   Anesthesia Other Findings   Reproductive/Obstetrics negative OB ROS                        Anesthesia Physical Anesthesia Plan  ASA: II  Anesthesia Plan: General ETT   Post-op Pain Management:    Induction: Intravenous  Airway Management Planned: Oral ETT  Additional Equipment:   Intra-op Plan:   Post-operative Plan:   Informed Consent: I  have reviewed the patients History and Physical, chart, labs and discussed the procedure including the risks, benefits and alternatives for the proposed anesthesia with the patient or authorized representative who has indicated his/her understanding and acceptance.   Dental Advisory Given  Plan Discussed with: CRNA, Surgeon and Anesthesiologist  Anesthesia Plan Comments:        Anesthesia Quick Evaluation

## 2012-01-03 NOTE — Anesthesia Postprocedure Evaluation (Signed)
  Anesthesia Post-op Note  Patient: [name]  Procedure(s) Performed: Procedure(s) (LRB) with comments: COLOSTOMY CLOSURE (N/A)  Patient Location: PACU  Anesthesia Type:General  Level of Consciousness: awake  Airway and Oxygen Therapy: Patient Spontanous Breathing  Post-op Pain: mild  Post-op Assessment: Post-op Vital signs reviewed  Post-op Vital Signs: stable  Complications: No apparent anesthesia complications

## 2012-01-03 NOTE — Anesthesia Procedure Notes (Signed)
Procedure Name: Intubation Date/Time: 01/03/2012 7:40 AM Performed by: Tyrone Nine Pre-anesthesia Checklist: Patient identified, Timeout performed, Emergency Drugs available, Suction available and Patient being monitored Patient Re-evaluated:Patient Re-evaluated prior to inductionOxygen Delivery Method: Circle system utilized Preoxygenation: Pre-oxygenation with 100% oxygen Intubation Type: IV induction Ventilation: Mask ventilation without difficulty Laryngoscope Size: Mac and 3 Grade View: Grade I Tube type: Oral Tube size: 8.0 mm Number of attempts: 1 Airway Equipment and Method: Stylet Placement Confirmation: ETT inserted through vocal cords under direct vision,  breath sounds checked- equal and bilateral,  positive ETCO2 and CO2 detector Secured at: 22 cm Tube secured with: Tape Dental Injury: Teeth and Oropharynx as per pre-operative assessment

## 2012-01-03 NOTE — Transfer of Care (Signed)
Immediate Anesthesia Transfer of Care Note  Patient: [name]  Procedure(s) Performed: Procedure(s) (LRB) with comments: COLOSTOMY CLOSURE (N/A)  Patient Location: PACU  Anesthesia Type:General  Level of Consciousness: awake, alert , oriented and patient cooperative  Airway & Oxygen Therapy: Patient Spontanous Breathing and Patient connected to nasal cannula oxygen  Post-op Assessment: Report given to PACU RN and Post -op Vital signs reviewed and stable  Post vital signs: Reviewed and stable  Complications: No apparent anesthesia complications

## 2012-01-03 NOTE — H&P (Signed)
[name] is an 56 y.o. male.    General Surgery [facility]  Chief Complaint: hx of diverticular disease, for colostomy closure  HPI: Patient is a 56 year old white male who underwent a Hartman's resection in August 2013 for perforated diverticular disease. Postoperative course was complicated by C. Difficile colitis. He had an open abdominal wound. Patient has recovered nicely. He underwent colonoscopy 3 weeks ago which showed a few small benign polyps which were resected. He is now prepared for colostomy closure.   Past Medical History  Diagnosis Date  . Hypertension   . Heart murmur   . Hemorrhoids   . Ileus following gastrointestinal surgery 09/15/2011  . Coronary heart disease     normal coronaries by 09/2003 cath;  endocarditis in 06    Past Surgical History  Procedure Date  . Hemorrhoid surgery   . Cardiac surgery 2006    mitral  valve repair  . Laparotomy 09/08/2011    Procedure: EXPLORATORY LAPAROTOMY;  Surgeon: Velora Heckler, MD;  Location: [facility];  Service: General;  Laterality: N/A;  sigmoid colectomy  . Appendectomy 09/08/2011    Procedure: APPENDECTOMY;  Surgeon: Velora Heckler, MD;  Location: [facility];  Service: General;  Laterality: N/A;  incidental  . Colostomy   . Colon surgery   . Cardiac catheterization     normal coronaries, preserved LV function, mod MR 10/01/03; now s/p MV repair     Family History  Problem Relation Age of Onset  . Cancer Brother     pancreatic   Social History:  reports that he has never smoked. He does not have any smokeless tobacco history on file. He reports that he drinks about 7 ounces of alcohol per week. He reports that he does not use illicit drugs.  Allergies: No Known Allergies  Medications Prior to Admission  Medication Sig Dispense Refill  . acetaminophen (TYLENOL) 325 MG tablet Take 650 mg by mouth every 6 (six) hours as needed. As needed for pain.      [name] aspirin 81 MG tablet Take 81 mg by mouth  daily.        . metoprolol succinate (TOPROL-XL) 100 MG 24 hr tablet Take 50 mg by mouth daily. Take 1/2 with or immediately following a meal.      . Multiple Vitamins-Minerals (MULTIVITAMIN WITH MINERALS) tablet Take 1 tablet by mouth daily.      . Omega-3 Fatty Acids (OMEGA-3 FISH OIL) 1200 MG CAPS Take 1 capsule by mouth at bedtime.        No results found for this or any previous visit (from the past 48 hour(s)). No results found.  Review of Systems  Constitutional: Negative.   HENT: Negative.   Eyes: Negative.   Respiratory: Negative.   Cardiovascular: Negative.   Gastrointestinal: Negative.   Genitourinary: Negative.   Musculoskeletal: Negative.   Skin: Negative.   Neurological: Negative.   Endo/Heme/Allergies: Negative.   Psychiatric/Behavioral: Negative.     Blood pressure 115/76, pulse 71, temperature 97.9 F (36.6 C), temperature source Oral, resp. rate 20, SpO2 98.00%. Physical Exam  Constitutional: He is oriented to person, place, and time. He appears well-developed and well-nourished. No distress.  HENT:  Head: Normocephalic and atraumatic.  Right Ear: External ear normal.  Left Ear: External ear normal.  Mouth/Throat: Oropharynx is clear and moist.  Eyes: Conjunctivae normal are normal. Pupils are equal, round, and reactive to light. No scleral icterus.  Neck: Normal range of motion. Neck supple.  No tracheal deviation present. No thyromegaly present.  Cardiovascular: Normal rate, regular rhythm and normal heart sounds.   No murmur heard. Respiratory: Effort normal and breath sounds normal. No respiratory distress.  GI: Soft. Bowel sounds are normal. He exhibits no distension. There is no tenderness.       Stoma LLQ normal  Musculoskeletal: Normal range of motion. He exhibits no edema and no tenderness.  Neurological: He is alert and oriented to person, place, and time.  Skin: Skin is warm and dry.  Psychiatric: He has a normal mood and affect. His behavior is  normal. Thought content normal.     Assessment/Plan History of diverticular disease  Plan colostomy closure  The risks and benefits of the procedure have been discussed at length with the patient.  The patient understands the proposed procedure, potential alternative treatments, and the course of recovery to be expected.  All of the patient's questions have been answered at this time.  The patient wishes to proceed with surgery.  Velora Heckler, MD, [facility] Office: [phone]    [name] Judie Petit 01/03/2012, 7:26 AM

## 2012-01-03 NOTE — Op Note (Signed)
[name], [name]                 ACCOUNT NO.:  [id number]  MEDICAL RECORD NO.:  [id number]  LOCATION:  6N04C                        FACILITY:  [facility]  PHYSICIAN:  Velora Heckler, MD      DATE OF BIRTH:  Apr 08, 1955  DATE OF PROCEDURE:  01/03/2012                               OPERATIVE REPORT   PREOPERATIVE DIAGNOSIS:  History of complicated diverticular disease.  POSTOPERATIVE DIAGNOSIS:  History of complicated diverticular disease.  PROCEDURE:  Colostomy closure.  SURGEON:  Velora Heckler, MD, FACS  ASSISTANT:  Axel Filler, MD  ANESTHESIA:  General.  ESTIMATED BLOOD LOSS:  Minimal.  PREPARATION:  Betadine.  COMPLICATIONS:  None.  INDICATIONS:  The patient is a 56 year old white male who underwent resection for perforated sigmoid diverticulitis.  The patient has now recovered nicely.  He has undergone colonoscopy.  He is now prepared for the operating room for colostomy closure.  BODY OF REPORT:  Procedure was done in OR #1 at the [facility].  The patient was brought to the operating room and placed in supine position on the operating room table.  Following administration of general anesthesia, the patient was placed in low lithotomy and then prepped and draped in usual strict aseptic fashion. After ascertaining that an adequate level of anesthesia had been achieved, the patient's previous midline surgical scar was excised. Midline incision was opened with extraction of previous suture material. Peritoneal cavity was entered cautiously.  There were a limited number of adhesions, which were lysed sharply and hemostasis achieved with the electrocautery.  The colostomy in the left lower quadrant was mobilized from its peritoneal attachments.  An elliptical incision was made on the skin around the colostomy and the colostomy was dissected out of the skin, subcutaneous tissues, and rectus musculature, and reduced back within the peritoneal cavity.  A  Balfour retractor was placed for exposure.  Small bowel loops adherent in the pelvis were mobilized with sharp dissection and extracted from the pelvis.  The rectal stump was visualized in the sacral hollow.  It was marked with Prolene sutures. It was mobilized with sharp dissection.  Next, the mesentery approximately 5 cm before the colostomy was divided between hemostats and ligated with 2-0 silk ties.  The bowel was transected with the electrocautery and appeared grossly normal.  A 3-0 Prolene pursestring suture was placed.  A 29-mm EEA stapler was selected and the anvil inserted into the proximal sigmoid colon and pursestring suture tied securely.  From below, the anus was dilated.  A 29 sizing dilator was inserted into the rectum and advanced.  It was removed.  The stapler was then inserted and advanced.  It was not possible to advance a stapler all the way to the previous staple line.  Therefore, the stapler was angled anteriorly and the anastomosis was performed through the anterior wall of the proximal rectum.  The anvil was reassembled to the deployed stapler and the stapler was closed, fired, and extracted. Two complete donuts of tissue were obtained with the stapler.  Rigid sigmoidoscopy was performed and showed an intact staple line with good hemostasis.  Air was insufflated into the rectum at the  level of the anastomosis and saline was placed in the pelvis.  There was no sign of leakage.  Saline was evacuated.  Good hemostasis was noted.  Small bowel was irrigated copiously and fluid evacuated.  Omentum was used to cover the small bowel.  The fascial defect at the previous site of the colostomy was closed with interrupted #1 Novafil simple sutures.  Midline incision was then closed with interrupted #1 Novafil simple sutures. Subcutaneous tissues were irrigated.  Skin incisions were closed with stainless steel staples.  Sterile dressings were applied.  The patient was  taken out lithotomy and awakened from anesthesia.  The patient was brought to the recovery room.  The patient tolerated the procedure well.   Velora Heckler, MD, [facility] Office: [phone]    TMG/MEDQ  D:  01/03/2012  T:  01/03/2012  Job:  [id number]

## 2012-01-03 NOTE — Preoperative (Addendum)
Beta Blockers   Metoprolol XL 50mg  PO @ 0500 on  01/03/12

## 2012-01-03 NOTE — Brief Op Note (Signed)
01/03/2012  10:21 AM  PATIENT:  [name]  56 y.o. male  PRE-OPERATIVE DIAGNOSIS:  diverticular disease  POST-OPERATIVE DIAGNOSIS:  diverticular disease  PROCEDURE:  Procedure(s) (LRB) with comments: COLOSTOMY CLOSURE (N/A)  SURGEON:  Surgeon(s) and Role:    * Velora Heckler, MD - Primary    * Axel Filler, MD - Assisting  ANESTHESIA:   general  EBL:  Total I/O In: 2750 [I.V.:2750] Out: 175 [Urine:125; Blood:50]  BLOOD ADMINISTERED:none  DRAINS: none   LOCAL MEDICATIONS USED:  NONE  SPECIMEN:  Excision  DISPOSITION OF SPECIMEN:  PATHOLOGY  COUNTS:  YES  TOURNIQUET:  * No tourniquets in log *  DICTATION: .Other Dictation: Dictation Number [id number]  PLAN OF CARE: Admit to inpatient   PATIENT DISPOSITION:  PACU - hemodynamically stable.   Delay start of Pharmacological VTE agent (>24hrs) due to surgical blood loss or risk of bleeding: yes  Velora Heckler, MD, [facility] Office: [phone]

## 2012-01-04 LAB — BASIC METABOLIC PANEL
BUN: 7 mg/dL (ref 6–23)
Calcium: 8.5 mg/dL (ref 8.4–10.5)
Creatinine, Ser: 0.85 mg/dL (ref 0.50–1.35)
GFR calc Af Amer: >90 mL/min (ref >90)
GFR calc non Af Amer: >90 mL/min (ref >90)

## 2012-01-04 LAB — CBC
HCT: 44.6 % (ref 39.0–52.0)
MCH: 30.2 pg (ref 26.0–34.0)
MCHC: 34.1 g/dL (ref 30.0–36.0)
MCV: 88.7 fL (ref 78.0–100.0)
RDW: 13.3 % (ref 11.5–15.5)

## 2012-01-04 MED ORDER — PROMETHAZINE HCL 25 MG/ML IJ SOLN
12.5000 mg | Freq: Three times a day (TID) | INTRAMUSCULAR | Status: DC | PRN
  Administered 2012-01-04: 12.5 mg via INTRAVENOUS
  Filled 2012-01-04: qty 1

## 2012-01-04 NOTE — Progress Notes (Signed)
CALLED BY PATIENT'S NURSE TO RESTART PIV DUE TO PATIENT'S COMPLAINTS OF DISCOMFORT. PIV TO LEFT FOREARM NOTED WITH SMALL BRUISE AT INSERTION SITE, NO S/S OF INFILTRATION, AND PATIENTS DENIES ANY DISCOMFORT TO AREA AND FOREARM. EXPLAINED TO PATIENT TO NOTIFY HIS NURSE IF AREA CHANGES OR BEGINS TO HURT AND IV TEAM WILL START NEW PIV, EXPLAIN TO STAFF RN CONVERSATION WITH PATIENT AND THAT PATIENT DENIED ANY DISCOMFORT, TO CALL BACK IF NEEDED

## 2012-01-04 NOTE — Progress Notes (Signed)
Patient ID: [name], male   DOB: Aug 23, 1955, 56 y.o.   MRN: [id number]  General Surgery - [facility] - Progress Note  POD# 1  Subjective: Patient awake and alert.  Moderate pain.  "Hungry".  Has not ambulated yet.  Objective: Vital signs in last 24 hours: Temp:  [97.2 F (36.2 C)-97.6 F (36.4 C)] 97.4 F (36.3 C) (12/18 0618) Pulse Rate:  [71-87] 81  (12/18 0618) Resp:  [12-22] 17  (12/18 0618) BP: (137-163)/(83-102) 142/92 mmHg (12/18 0618) SpO2:  [99 %-100 %] 100 % (12/18 0618) Weight:  [170 lb (77.111 kg)] 170 lb (77.111 kg) (12/17 1600) Last BM Date: 01/02/12  Intake/Output from previous day: 12/17 0701 - 12/18 0700 In: 4671 [I.V.:4671] Out: 1625 [Urine:1575; Blood:50]  Exam: HEENT - clear, not icteric Neck - soft Chest - clear bilaterally Cor - RRR, no murmur Abd - soft, mild distension; BS present; dressing dry and intact Ext - no significant edema Neuro - grossly intact, no focal deficits  Lab Results:   Basename 01/04/12 0620 01/03/12 1310  WBC 11.3* 18.5*  HGB 15.2 15.1  HCT 44.6 44.4  PLT 228 242     Basename 01/04/12 0620 01/03/12 1310  NA 134* --  K 4.0 --  CL 101 --  CO2 27 --  GLUCOSE 133* --  BUN 7 --  CREATININE 0.85 1.06  CALCIUM 8.5 --    Studies/Results: No results found.  Assessment / Plan: 1.  Status post colostomy closure  Begin clear liquid diet  OOB, ambulate  Remove Foley in AM 12/19  Encouraged IS use  Velora Heckler, MD, [facility] Office: [phone]  01/04/2012

## 2012-01-05 ENCOUNTER — Encounter (HOSPITAL_COMMUNITY): Payer: Self-pay | Admitting: Surgery

## 2012-01-05 LAB — BASIC METABOLIC PANEL
Calcium: 8.8 mg/dL (ref 8.4–10.5)
GFR calc Af Amer: >90 mL/min (ref >90)
GFR calc non Af Amer: >90 mL/min (ref >90)
Potassium: 4.2 mEq/L (ref 3.5–5.1)
Sodium: 132 mEq/L — ABNORMAL LOW (ref 135–145)

## 2012-01-05 LAB — CBC
MCH: 30.1 pg (ref 26.0–34.0)
MCHC: 34 g/dL (ref 30.0–36.0)
RDW: 13.3 % (ref 11.5–15.5)

## 2012-01-05 MED ORDER — PROMETHAZINE HCL 25 MG/ML IJ SOLN: 12.5000 mg | INTRAMUSCULAR | Status: DC | PRN

## 2012-01-05 NOTE — Progress Notes (Signed)
Patient ID: [name], male   DOB: 05-Jun-1955, 56 y.o.   MRN: [id number]  General Surgery - [facility] - Progress Note  POD# 2  Subjective: Patient with some nausea, tolerating limited clear liquids.  Has not ambulated.  Up to chair.   Objective: Vital signs in last 24 hours: Temp:  [97.8 F (36.6 C)-99.5 F (37.5 C)] 98.5 F (36.9 C) (12/19 1003) Pulse Rate:  [80-101] 100  (12/19 1003) Resp:  [12-23] 23  (12/19 1200) BP: (131-144)/(89-103) 144/103 mmHg (12/19 1003) SpO2:  [96 %-100 %] 97 % (12/19 1200) Last BM Date: 01/03/12  Intake/Output from previous day: 12/18 0701 - 12/19 0700 In: 3681.7 [I.V.:3681.7] Out: 1100 [Urine:1100]  Exam: HEENT - clear, not icteric Neck - soft Chest - clear bilaterally Cor - RRR, no murmur Abd - soft, mild distension; BS present; wounds clear and dry, no drainage, no erythema Ext - no significant edema Neuro - grossly intact, no focal deficits  Lab Results:   [facility] 01/05/12 0540 01/04/12 0620  WBC 17.4* 11.3*  HGB 14.7 15.2  HCT 43.2 44.6  PLT 207 228     Basename 01/05/12 0540 01/04/12 0620  NA 132* 134*  K 4.2 4.0  CL 96 101  CO2 27 27  GLUCOSE 124* 133*  BUN 7 7  CREATININE 0.86 0.85  CALCIUM 8.8 8.5    Studies/Results: No results found.  Assessment / Plan: 1.  Status post colostomy closure  Continue clear liquid diet  Phenergan for nausea as needed  Encourage ambulation in halls  Continue IVF  Await resolution of ileus  Velora Heckler, MD, [facility] Office: [phone]  01/05/2012

## 2012-01-06 MED ORDER — HYDROCODONE-ACETAMINOPHEN 5-325 MG PO TABS
1.0000 | ORAL_TABLET | ORAL | Status: DC | PRN
  Administered 2012-01-06 – 2012-01-07 (×3): 1 via ORAL
  Filled 2012-01-06 (×3): qty 1

## 2012-01-06 NOTE — Progress Notes (Signed)
Patient ID: [name], male   DOB: February 16, 1955, 56 y.o.   MRN: [id number]  General Surgery - [facility] - Progress Note  POD# 3  Subjective: Patient much improved.  Nausea resolved.  Three BM's since yesterday.  Voiding.  Ambulating.  Objective: Vital signs in last 24 hours: Temp:  [97.8 F (36.6 C)-98.8 F (37.1 C)] 98.6 F (37 C) (12/20 0552) Pulse Rate:  [84-100] 90  (12/20 0552) Resp:  [14-23] 16  (12/20 0821) BP: (131-148)/(83-103) 140/98 mmHg (12/20 0552) SpO2:  [96 %-99 %] 99 % (12/20 0821) Last BM Date: 01/03/12  Intake/Output from previous day: 12/19 0701 - 12/20 0700 In: 2335 [I.V.:2335] Out: 1550 [Urine:1550]  Exam: HEENT - clear, not icteric Neck - soft Chest - clear bilaterally Cor - RRR, no murmur Abd - softer, no distension; BS present; dressing dry and intact Ext - no significant edema Neuro - grossly intact, no focal deficits  Lab Results:   [facility] 01/05/12 0540 01/04/12 0620  WBC 17.4* 11.3*  HGB 14.7 15.2  HCT 43.2 44.6  PLT 207 228     Basename 01/05/12 0540 01/04/12 0620  NA 132* 134*  K 4.2 4.0  CL 96 101  CO2 27 27  GLUCOSE 124* 133*  BUN 7 7  CREATININE 0.86 0.85  CALCIUM 8.8 8.5    Studies/Results: No results found.  Assessment / Plan: 1. Status post colostomy closure   Resolving ileus  Advance to regular diet  Discontinue PCA and begin po pain Rx  Decrease IVF rate  Likely home tomorrow  Velora Heckler, MD, [facility] Office: [phone]  01/06/2012

## 2012-01-07 MED ORDER — HYDROCODONE-ACETAMINOPHEN 5-325 MG PO TABS: 1.0000 | ORAL_TABLET | ORAL | Status: DC | PRN

## 2012-01-07 NOTE — Progress Notes (Signed)
4 Days Post-Op  Subjective: No c/o. No n/v. +bm. Pain controlled  Objective: Vital signs in last 24 hours: Temp:  [98.7 F (37.1 C)-98.8 F (37.1 C)] 98.8 F (37.1 C) (12/21 0530) Pulse Rate:  [94-107] 107  (12/21 0810) Resp:  [18-20] 18  (12/21 0530) BP: (122-154)/(49-99) 154/49 mmHg (12/21 0810) SpO2:  [97 %-100 %] 100 % (12/21 0530) Last BM Date: 01/06/12  Intake/Output from previous day: 12/20 0701 - 12/21 0700 In: 1858.7 [P.O.:1200; I.V.:658.7] Out: -  Intake/Output this shift:    Alert, nad cta Reg Soft, nd, min TTP. Incision c/d/i  Lab Results:   Basename 01/05/12 0540  WBC 17.4*  HGB 14.7  HCT 43.2  PLT 207   BMET  Basename 01/05/12 0540  NA 132*  K 4.2  CL 96  CO2 27  GLUCOSE 124*  BUN 7  CREATININE 0.86  CALCIUM 8.8   PT/INR No results found for this basename: LABPROT:2,INR:2 in the last 72 hours ABG No results found for this basename: PHART:2,PCO2:2,PO2:2,HCO3:2 in the last 72 hours  Studies/Results: No results found.  Anti-infectives: Anti-infectives     Start     Dose/Rate Route Frequency Ordered Stop   01/03/12 0600   ertapenem (INVANZ) 1 g in sodium chloride 0.9 % 50 mL IVPB        1 g 100 mL/hr over 30 Minutes Intravenous On call to O.R. 01/02/12 1454 01/03/12 0730          Assessment/Plan: s/p Procedure(s) (LRB) with comments: COLOSTOMY CLOSURE (N/A)  Doing well Discussed d/c instruction Ok for d/c  [employer]. Andrey Campanile, MD, FACS General, Bariatric, & Minimally Invasive Surgery [facility], [place]   LOS: 4 days    [name] 01/07/2012

## 2012-01-07 NOTE — Progress Notes (Signed)
[name] to be D/C'd Home per MD order.  Discussed with the patient and all questions fully answered.   [name], [name]  Home Medication Instructions [id number]   Printed on:01/07/12 1215  Medication Information                    aspirin 81 MG tablet Take 81 mg by mouth daily.             metoprolol succinate (TOPROL-XL) 100 MG 24 hr tablet Take 50 mg by mouth daily. Take 1/2 with or immediately following a meal.           Multiple Vitamins-Minerals (MULTIVITAMIN WITH MINERALS) tablet Take 1 tablet by mouth daily.           Omega-3 Fatty Acids (OMEGA-3 FISH OIL) 1200 MG CAPS Take 1 capsule by mouth at bedtime.           acetaminophen (TYLENOL) 325 MG tablet Take 650 mg by mouth every 6 (six) hours as needed. As needed for pain.           HYDROcodone-acetaminophen (NORCO/VICODIN) 5-325 MG per tablet Take 1-2 tablets by mouth every 4 (four) hours as needed.             VVS, Skin clean, dry and intact without evidence of skin break down, no evidence of skin tears noted. IV catheter discontinued intact. Site without signs and symptoms of complications. Dressing and pressure applied.  An After Visit Summary was printed and given to the patient. Follow up appointments , new prescriptions and medication administration times given. Care of surgical incision information given to patient Patient  D/C home via private auto.  Cindra Eves, RN 01/07/2012 12:15 PM

## 2012-01-09 ENCOUNTER — Encounter (INDEPENDENT_AMBULATORY_CARE_PROVIDER_SITE_OTHER): Payer: Self-pay

## 2012-01-15 NOTE — Discharge Summary (Signed)
  Physician Discharge Summary [facility]  Patient ID: [name] MRN: [id number] DOB/AGE: [birth year] 56 y.o.  Admit date: 01/03/2012 Discharge date: 01/07/2012   Admission Diagnoses:  Complicated diverticular disease, status post Hartmann's resection  Discharge Diagnoses:  Principal Problem:  *Diverticulitis of colon with microperforation   Discharged Condition: good  Hospital Course: patient admitted on 01/03/2012 for colostomy closure.  To OR for laparotomy and colostomy closure with EEA stapled anastomosis.  Post op course uneventful with gradual resolution of ileus and diet advancement.  Prepared for discharge home on POD#4.  Consults: None  Significant Diagnostic Studies: labs  Treatments: surgery: colostomy closure with resection  Discharge Exam: Blood pressure 154/49, pulse 107, temperature 98.8 F (37.1 C), temperature source Oral, resp. rate 18, height 6\' 2"  (1.88 m), weight 170 lb (77.111 kg), SpO2 100.00%. HEENT - clear Chest - good BS bilat Cor - RRR Abd - wounds clear and dry, no drainage; BS present  Disposition: Home with family  Discharge Orders    Future Appointments: Provider: Department: Dept Phone: Center:   01/16/2012 8:50 AM [facility] Nurse [facility], [place] [phone] None   01/23/2012 2:45 PM Velora Heckler, MD [facility], [place] [phone] None     Future Orders Please Complete By Expires   Increase activity slowly      Discharge instructions      Comments:   See [facility] discharge instructions       Medication List     As of 01/15/2012  9:29 AM    TAKE these medications         acetaminophen 325 MG tablet   Commonly known as: TYLENOL   Take 650 mg by mouth every 6 (six) hours as needed. As needed for pain.      aspirin 81 MG tablet   Take 81 mg by mouth daily.      HYDROcodone-acetaminophen 5-325 MG per tablet   Commonly known as: NORCO/VICODIN   Take 1-2 tablets by mouth every 4  (four) hours as needed.      metoprolol succinate 100 MG 24 hr tablet   Commonly known as: TOPROL-XL   Take 50 mg by mouth daily. Take 1/2 with or immediately following a meal.      multivitamin with minerals tablet   Take 1 tablet by mouth daily.      OMEGA-3 FISH OIL 1200 MG Caps   Take 1 capsule by mouth at bedtime.           Follow-up Information    Follow up with Velora Heckler, MD. Schedule an appointment as soon as possible for a visit in 12 days.   Contact information:   [address] [place] [address] [phone]          Velora Heckler, MD, [facility] Office: [phone]   Signed: Velora Heckler 01/15/2012, 9:29 AM

## 2012-01-16 ENCOUNTER — Ambulatory Visit (INDEPENDENT_AMBULATORY_CARE_PROVIDER_SITE_OTHER): Payer: Managed Care, Other (non HMO)

## 2012-01-16 VITALS — Temp 98.0°F | Resp 18

## 2012-01-16 DIAGNOSIS — Z4802 Encounter for removal of sutures: Secondary | ICD-10-CM

## 2012-01-16 NOTE — Progress Notes (Signed)
Pt in office for staple remval. Healing well. No fever. Pt states eating and voiding well. Staples removed. Benzion and steri strips applied. Pt advised wd care. Pt to call with any concerns and to keep appt next week.

## 2012-01-20 ENCOUNTER — Telehealth (INDEPENDENT_AMBULATORY_CARE_PROVIDER_SITE_OTHER): Payer: Self-pay | Admitting: General Surgery

## 2012-01-20 NOTE — Telephone Encounter (Signed)
Pt called for refill of pain meds; had surgery on 01/03/12.  Called in Hydrocodone 5/325 mg, # 30, 1-2 po Q4-6H prn pain, no refill to [facility]:  [phone].

## 2012-01-23 ENCOUNTER — Ambulatory Visit (INDEPENDENT_AMBULATORY_CARE_PROVIDER_SITE_OTHER): Payer: Managed Care, Other (non HMO) | Admitting: Surgery

## 2012-01-23 ENCOUNTER — Encounter (INDEPENDENT_AMBULATORY_CARE_PROVIDER_SITE_OTHER): Payer: Self-pay | Admitting: Surgery

## 2012-01-23 VITALS — BP 130/82 | HR 81 | Temp 97.4°F | Ht 74.0 in | Wt 162.2 lb

## 2012-01-23 DIAGNOSIS — K5720 Diverticulitis of large intestine with perforation and abscess without bleeding: Secondary | ICD-10-CM

## 2012-01-23 DIAGNOSIS — K5732 Diverticulitis of large intestine without perforation or abscess without bleeding: Secondary | ICD-10-CM

## 2012-01-23 NOTE — Patient Instructions (Signed)
Wet to dry dressing to colostomy site once or twice daily.  Antibiotic ointment to site of suture in midline incision twice daily.  Velora Heckler, MD, [facility] Office: [phone]

## 2012-01-23 NOTE — Progress Notes (Signed)
General Surgery [facility]  Visit Diagnoses: 1. Diverticulitis of colon with microperforation     HISTORY: The patient returns for her first postoperative visit having undergone colostomy closure on 01/03/2012. Postoperative course has been largely uneventful. He is having 2 formed bowel movements daily.  EXAM: Examination of the abdominal wall shows that the colostomy site has opened in the midportion. There is approximately a 2 cm x 2 cm x 2 cm defect with granulation tissue. This is cleansed with a Q-tip and hydrogen peroxide and packed with a normal saline moistened 2 x 2 gauze sponge. The midline abdominal incision is healing nicely. However there is an exposed suture in the low midline. This is retracted with a hemostat and excised in its entirety. Antibiotic ointment and a Band-Aid are placed as dressing.  IMPRESSION: Status post colostomy closure  PLAN: Wound care instructions were provided to the patient. He will return to see me for a wound check in 10-14 days.  Velora Heckler, MD, FACS General & Endocrine Surgery [facility]

## 2012-02-08 ENCOUNTER — Ambulatory Visit (INDEPENDENT_AMBULATORY_CARE_PROVIDER_SITE_OTHER): Payer: Managed Care, Other (non HMO) | Admitting: Surgery

## 2012-02-08 ENCOUNTER — Encounter (INDEPENDENT_AMBULATORY_CARE_PROVIDER_SITE_OTHER): Payer: Self-pay | Admitting: Surgery

## 2012-02-08 ENCOUNTER — Telehealth (INDEPENDENT_AMBULATORY_CARE_PROVIDER_SITE_OTHER): Payer: Self-pay

## 2012-02-08 VITALS — BP 138/90 | HR 76 | Temp 97.4°F | Resp 16 | Ht 74.0 in | Wt 162.6 lb

## 2012-02-08 DIAGNOSIS — K5720 Diverticulitis of large intestine with perforation and abscess without bleeding: Secondary | ICD-10-CM

## 2012-02-08 DIAGNOSIS — K5732 Diverticulitis of large intestine without perforation or abscess without bleeding: Secondary | ICD-10-CM

## 2012-02-08 NOTE — Patient Instructions (Signed)
Antibiotic ointment to wounds 2-3 x daily.  Velora Heckler, MD, [facility] Office: [phone]

## 2012-02-08 NOTE — Progress Notes (Signed)
General Surgery [facility]  Visit Diagnoses: 1. Diverticulitis of colon with microperforation     HISTORY: Patient returns for his second postoperative visit. He is doing wet to dry dressing changes at the stoma site. He complains of a suture in the midline incision which is causing pain.  EXAM: Stoma site is nearly completely closed by secondary intention. No further packing as necessary. Antibiotic ointment and a Band-Aid are placed.  In the midline incision a suture has eroded through the skin in the lower portion of the wound. Under aseptic conditions using local anesthetic a small incision is made with a #11 blade. Suture is grasped with a hemostat and extracted.  It is divided and the entire suture is removed.  Antibiotic ointment and a dry gauze dressing are applied.  IMPRESSION: Status post colostomy closure  PLAN: Wound care instructions are given to the patient. He will return in 6 weeks for final wound check.  Velora Heckler, MD, FACS General & Endocrine Surgery [facility]

## 2012-02-08 NOTE — Telephone Encounter (Signed)
Pt requesting refill norco. Dr Gerrit Friends  hand wrote rx for norco #20 and gave to pt today.

## 2012-02-15 ENCOUNTER — Telehealth: Payer: Self-pay | Admitting: Cardiovascular Disease

## 2012-02-15 MED ORDER — METOPROLOL SUCCINATE ER 100 MG PO TB24: ORAL_TABLET | ORAL | Status: DC

## 2012-02-15 NOTE — Telephone Encounter (Signed)
New problem:   Metoprolol  100 mg    [facility] [address] [place].

## 2012-02-17 ENCOUNTER — Other Ambulatory Visit: Payer: Self-pay | Admitting: *Deleted

## 2012-02-17 NOTE — Telephone Encounter (Signed)
Opened in Error.

## 2012-02-22 ENCOUNTER — Other Ambulatory Visit: Payer: Self-pay | Admitting: *Deleted

## 2012-02-22 MED ORDER — METOPROLOL SUCCINATE ER 50 MG PO TB24: 50.0000 mg | ORAL_TABLET | Freq: Every day | ORAL | Status: DC

## 2012-02-22 NOTE — Telephone Encounter (Signed)
Pt called in wanting his toprol-xl sent in to his pharmacy. Resent it to pharmacy and advised patient to call back if pharmacy still don't have medication. Pt agreed.

## 2012-02-24 ENCOUNTER — Other Ambulatory Visit: Payer: Self-pay | Admitting: *Deleted

## 2012-02-24 NOTE — Telephone Encounter (Signed)
Opened in Error.

## 2012-02-28 ENCOUNTER — Other Ambulatory Visit: Payer: Self-pay | Admitting: *Deleted

## 2012-02-28 NOTE — Telephone Encounter (Signed)
Opened in Error.

## 2012-03-03 ENCOUNTER — Other Ambulatory Visit: Payer: Self-pay

## 2012-03-19 ENCOUNTER — Encounter (INDEPENDENT_AMBULATORY_CARE_PROVIDER_SITE_OTHER): Payer: Self-pay | Admitting: Surgery

## 2012-03-19 ENCOUNTER — Ambulatory Visit (INDEPENDENT_AMBULATORY_CARE_PROVIDER_SITE_OTHER): Payer: Managed Care, Other (non HMO) | Admitting: Surgery

## 2012-03-19 VITALS — BP 130/86 | HR 72 | Temp 97.9°F | Resp 14 | Ht 74.0 in | Wt 166.8 lb

## 2012-03-19 DIAGNOSIS — T8130XA Disruption of wound, unspecified, initial encounter: Secondary | ICD-10-CM | POA: Insufficient documentation

## 2012-03-19 DIAGNOSIS — K5732 Diverticulitis of large intestine without perforation or abscess without bleeding: Secondary | ICD-10-CM

## 2012-03-19 DIAGNOSIS — T889XXS Complication of surgical and medical care, unspecified, sequela: Secondary | ICD-10-CM

## 2012-03-19 DIAGNOSIS — T8130XS Disruption of wound, unspecified, sequela: Secondary | ICD-10-CM

## 2012-03-19 DIAGNOSIS — K5720 Diverticulitis of large intestine with perforation and abscess without bleeding: Secondary | ICD-10-CM

## 2012-03-19 NOTE — Progress Notes (Signed)
General Surgery [facility]  Visit Diagnoses: 1. Diverticulitis of colon with microperforation   2. Extruding suture, sequela     HISTORY: Patient returns for follow-up. He is 2 1/2 months out from colostomy closure. Overall he is doing quite well. GI function is normal. He is active and working out at [employer]. He complains of pain from his midline abdominal sutures. He has noted that one suture has eroded through the skin and he is seeing a small amount of drainage.  EXAM: Palpable suture material in lower midline surgical wound. One area of ulceration with serous drainage. Mild tenderness.  IMPRESSION: Extruding suture material following colostomy closure  PLAN: The patient would like to have these sutures removed as they are causing undue discomfort. This is permanent suture material and will not dissolve. There are at least 4 sutures involved over a distance of approximately 6-7 cm. I think this would best be performed as an outpatient surgical procedure under sedation and local anesthesia. We will make arrangements for this in the near future.  The risks and benefits of the procedure have been discussed at length with the patient.  The patient understands the proposed procedure, potential alternative treatments, and the course of recovery to be expected.  All of the patient's questions have been answered at this time.  The patient wishes to proceed with surgery.  Velora Heckler, MD, FACS General & Endocrine Surgery [facility]

## 2012-03-22 ENCOUNTER — Encounter (INDEPENDENT_AMBULATORY_CARE_PROVIDER_SITE_OTHER): Payer: Self-pay | Admitting: Surgery

## 2012-03-22 ENCOUNTER — Ambulatory Visit (INDEPENDENT_AMBULATORY_CARE_PROVIDER_SITE_OTHER): Payer: Managed Care, Other (non HMO) | Admitting: Surgery

## 2012-03-22 VITALS — BP 152/103 | HR 85 | Temp 98.2°F | Resp 16 | Ht 74.0 in | Wt 162.8 lb

## 2012-03-22 DIAGNOSIS — T8130XS Disruption of wound, unspecified, sequela: Secondary | ICD-10-CM

## 2012-03-22 DIAGNOSIS — T889XXS Complication of surgical and medical care, unspecified, sequela: Secondary | ICD-10-CM

## 2012-03-22 NOTE — Progress Notes (Signed)
Patient presents for extraction of permanent suture material from his midline wound.  Procedure performed under local anesthesia in office.  Four Novofil sutures removed from lower midline incision.  Wounds closed with 4-0 Nylon sutures.  Will return in one week for suture removal.  Velora Heckler, MD, [facility] Office: [phone]

## 2012-03-26 ENCOUNTER — Encounter: Payer: Self-pay | Admitting: Cardiovascular Disease

## 2012-03-27 ENCOUNTER — Telehealth (INDEPENDENT_AMBULATORY_CARE_PROVIDER_SITE_OTHER): Payer: Self-pay

## 2012-03-27 NOTE — Telephone Encounter (Signed)
LMOM for pt to arrive at 9:00 am and not earlier tomorrow due to meeting.

## 2012-03-28 ENCOUNTER — Ambulatory Visit (INDEPENDENT_AMBULATORY_CARE_PROVIDER_SITE_OTHER): Payer: Managed Care, Other (non HMO) | Admitting: Surgery

## 2012-03-28 ENCOUNTER — Encounter (INDEPENDENT_AMBULATORY_CARE_PROVIDER_SITE_OTHER): Payer: Self-pay | Admitting: Surgery

## 2012-03-28 VITALS — BP 126/84 | HR 84 | Temp 97.4°F | Resp 16 | Ht 74.0 in | Wt 167.0 lb

## 2012-03-28 DIAGNOSIS — T8130XS Disruption of wound, unspecified, sequela: Secondary | ICD-10-CM

## 2012-03-28 DIAGNOSIS — T889XXS Complication of surgical and medical care, unspecified, sequela: Secondary | ICD-10-CM

## 2012-03-28 NOTE — Patient Instructions (Signed)
Wound care as instructed.  Todd M. Gerkin, MD, FACS [facility] Office: [phone]   

## 2012-03-28 NOTE — Progress Notes (Signed)
General Surgery [facility]  Visit Diagnoses: 1. Extruding suture, sequela     HISTORY: The patient returns for wound check having undergone removal of suture material from the midline surgical incision.  EXAM: Wounds have healed uneventfully. Nylon sutures are removed. Steri-Strips are applied.  IMPRESSION: Status post excision of retained sutures following laparotomy  PLAN: Usual wound care obstructions are given. Patient will return as needed.  Velora Heckler, MD, FACS General & Endocrine Surgery [facility]

## 2012-03-29 ENCOUNTER — Ambulatory Visit (INDEPENDENT_AMBULATORY_CARE_PROVIDER_SITE_OTHER): Payer: Managed Care, Other (non HMO) | Admitting: Cardiovascular Disease

## 2012-03-29 ENCOUNTER — Encounter: Payer: Self-pay | Admitting: Cardiovascular Disease

## 2012-03-29 VITALS — BP 130/88 | HR 80 | Ht 74.0 in | Wt 164.0 lb

## 2012-03-29 DIAGNOSIS — Z9889 Other specified postprocedural states: Secondary | ICD-10-CM

## 2012-03-29 MED ORDER — METOPROLOL SUCCINATE ER 100 MG PO TB24: 50.0000 mg | ORAL_TABLET | Freq: Every day | ORAL | Status: DC

## 2012-03-29 MED ORDER — METOPROLOL SUCCINATE ER 100 MG PO TB24: ORAL_TABLET | ORAL | Status: DC

## 2012-03-29 NOTE — Progress Notes (Signed)
[name] Date of Birth  January 22, 1955       [facility] Office [address]  [address] [place], [place]  [address]   [place], [place]  [address] [phone]     [phone]   Fax  [phone]    Fax [phone]  Problem List: 1. 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 Hypertension  History of Present Illness:  March 29, 2012:  [name] is seen today after a 2 year absence.  He has done well.  He is working out regularly - 3 days a week.  No Cp or dyspnea.  No syncope.  Current Outpatient Prescriptions on File Prior to Visit  Medication Sig Dispense Refill  . acetaminophen (TYLENOL) 325 MG tablet Take 650 mg by mouth every 6 (six) hours as needed. As needed for pain.      [name] aspirin 81 MG tablet Take 81 mg by mouth daily.        [name] HYDROcodone-acetaminophen (NORCO/VICODIN) 5-325 MG per tablet Take 1-2 tablets by mouth every 4 (four) hours as needed.  40 tablet  0  . Multiple Vitamins-Minerals (MULTIVITAMIN WITH MINERALS) tablet Take 1 tablet by mouth daily.      . Omega-3 Fatty Acids (OMEGA-3 FISH OIL) 1200 MG CAPS Take 1 capsule by mouth at bedtime.       No current facility-administered medications on file prior to visit.    No Known Allergies  Past Medical History  Diagnosis Date  . Hypertension   . Heart murmur   . Hemorrhoids   . Ileus following gastrointestinal surgery 09/15/2011  . Coronary heart disease     normal coronaries by 09/2003 cath;  endocarditis in 06    Past Surgical History  Procedure Laterality Date  . Hemorrhoid surgery    . Cardiac surgery  2006    mitral  valve repair  . Laparotomy  09/08/2011    Procedure: EXPLORATORY LAPAROTOMY;  Surgeon: Velora Heckler, MD;  Location: [facility] ORS;  Service: General;  Laterality: N/A;  sigmoid colectomy  . Appendectomy  09/08/2011    Procedure: APPENDECTOMY;  Surgeon: Velora Heckler,  MD;  Location: [facility] ORS;  Service: General;  Laterality: N/A;  incidental  . Colostomy    . Colon surgery    . Cardiac catheterization      normal coronaries, preserved LV function, mod MR 10/01/03; now s/p MV repair   . Colostomy closure  01/03/2012    Procedure: COLOSTOMY CLOSURE;  Surgeon: Velora Heckler, MD;  Location: [facility] OR;  Service: General;  Laterality: N/A;    History  Smoking status  . Never Smoker   Smokeless tobacco  . Not on file    History  Alcohol Use  . 7.0 oz/week  . 14 drink(s) per week    Comment: vodka    Family History  Problem Relation Age of Onset  . Cancer Brother     pancreatic    Reviw of Systems:  Reviewed in the HPI.  All other systems are negative.  Physical Exam: Blood pressure 130/88, pulse 80, height 6\' 2"  (1.88 m), weight 164 lb (74.39 kg). General: Well developed, well nourished, in no acute distress.  Head: Normocephalic, atraumatic, sclera non-icteric, mucus membranes are moist,   Neck: Supple. Carotids are 2 + without bruits. No JVD   Lungs: Clear   Heart: RR, normal S1, S2,  soft systolic murmur radiating to the axilla  Abdomen: Soft, non-tender, non-distended with normal bowel sounds.  Msk:  Strength and tone are normal   Extremities: No clubbing or cyanosis. No edema.  Distal pedal pulses are 2+ and equal    Neuro: CN II - XII intact.  Alert and oriented X 3.   Psych:  Normal   ECG: March 29, 2012:  NSR at 80. Normal ECG  Assessment / Plan:

## 2012-03-29 NOTE — Assessment & Plan Note (Signed)
[name]  has a history of mitral valve prolapse with endocarditis. He had mitral valve repair in 2006 with Dr. Tyrone Sage.  He has had mild mitral regurgitation clinically since that time. He has not been back to see me in 2 years.  [name] is doing very well. We'll continue with his same medications. We'll get an echocardiogram. I'll plan on seeing him again in 2 years-sooner if needed.

## 2012-03-29 NOTE — Patient Instructions (Addendum)
Your physician has requested that you have an echocardiogram. Echocardiography is a painless test that uses sound waves to create images of your heart. It provides your doctor with information about the size and shape of your heart and how well your heart's chambers and valves are working. This procedure takes approximately one hour. There are no restrictions for this procedure.  Your physician wants you to follow-up in: 2 YEARS  You will receive a reminder letter in the mail two months in advance. If you don't receive a letter, please call our office to schedule the follow-up appointment.   Your physician recommends that you continue on your current medications as directed. Please refer to the Current Medication list given to you today.

## 2012-04-09 ENCOUNTER — Ambulatory Visit (HOSPITAL_COMMUNITY): Payer: Managed Care, Other (non HMO) | Attending: Cardiovascular Disease | Admitting: Radiology

## 2012-04-09 DIAGNOSIS — I10 Essential (primary) hypertension: Secondary | ICD-10-CM | POA: Insufficient documentation

## 2012-04-09 DIAGNOSIS — Z9889 Other specified postprocedural states: Secondary | ICD-10-CM

## 2012-04-09 DIAGNOSIS — I059 Rheumatic mitral valve disease, unspecified: Secondary | ICD-10-CM

## 2012-04-09 DIAGNOSIS — R011 Cardiac murmur, unspecified: Secondary | ICD-10-CM

## 2012-04-09 NOTE — Progress Notes (Signed)
Echocardiogram performed.  

## 2012-04-12 ENCOUNTER — Encounter: Payer: Self-pay | Admitting: *Deleted

## 2012-08-31 NOTE — Progress Notes (Signed)
PER CHANDRA PT WAS CALLED AND INFORMED HE NEEDED AN APT. PT STATED HE WOULD CALL BACK

## 2012-10-08 ENCOUNTER — Other Ambulatory Visit: Payer: Self-pay | Admitting: Cardiovascular Disease

## 2012-11-22 ENCOUNTER — Other Ambulatory Visit: Payer: Self-pay

## 2013-03-17 ENCOUNTER — Other Ambulatory Visit: Payer: Self-pay | Admitting: Cardiovascular Disease

## 2013-06-14 ENCOUNTER — Telehealth: Payer: Self-pay | Admitting: Cardiovascular Disease

## 2013-06-14 NOTE — Telephone Encounter (Signed)
[name] with [facility] states that she will call pt to discuss paperwork today.

## 2013-06-14 NOTE — Telephone Encounter (Signed)
° ° 

## 2013-06-14 NOTE — Telephone Encounter (Signed)
° °   New problem      Pt called to follow up on his [employer] paper work. Pt stated [facility] is waiting on [name] to reply to them about this. It is now time sensitive. Pt would like a call back with an update on this please.

## 2013-07-30 ENCOUNTER — Other Ambulatory Visit: Payer: Self-pay | Admitting: Cardiovascular Disease

## 2013-11-10 ENCOUNTER — Other Ambulatory Visit: Payer: Self-pay | Admitting: Cardiovascular Disease

## 2014-01-22 ENCOUNTER — Other Ambulatory Visit: Payer: Self-pay | Admitting: *Deleted

## 2014-01-22 ENCOUNTER — Other Ambulatory Visit: Payer: Self-pay | Admitting: Cardiovascular Disease

## 2014-01-22 MED ORDER — METOPROLOL SUCCINATE ER 50 MG PO TB24: 50.0000 mg | ORAL_TABLET | Freq: Every day | ORAL | Status: DC

## 2014-03-20 ENCOUNTER — Encounter: Payer: Self-pay | Admitting: Cardiovascular Disease

## 2014-03-20 ENCOUNTER — Ambulatory Visit (INDEPENDENT_AMBULATORY_CARE_PROVIDER_SITE_OTHER): Payer: Managed Care, Other (non HMO) | Admitting: Cardiovascular Disease

## 2014-03-20 VITALS — BP 138/82 | HR 102 | Ht 74.0 in | Wt 177.0 lb

## 2014-03-20 DIAGNOSIS — Z9889 Other specified postprocedural states: Secondary | ICD-10-CM

## 2014-03-20 MED ORDER — SILDENAFIL CITRATE 20 MG PO TABS: 20.0000 mg | ORAL_TABLET | Freq: Three times a day (TID) | ORAL | Status: DC

## 2014-03-20 MED ORDER — METOPROLOL SUCCINATE ER 25 MG PO TB24: 75.0000 mg | ORAL_TABLET | Freq: Every day | ORAL | Status: DC

## 2014-03-20 NOTE — Progress Notes (Signed)
Cardiology Office Note   Date:  03/20/2014   ID:  [name], DOB 07-01-1955, MRN [id number]  PCP:  No PCP Per Patient  Cardiologist:   Ross HashimotoNahser, Ross PingPhilip J, MD   Chief Complaint  Patient presents with  . Follow-up     mitral valve replacement     1. 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 Hypertension  History of Present Illness:  March 29, 2012:  [name] is seen today after a 2 year absence. He has done well. He is working out regularly - 3 days a week. No Cp or dyspnea. No syncope.   March 20, 2014:   [name] is a 59 y.o. male who presents for follow up of his mitral valve replacement  He is doing great.   Some concerns about ED. No CP Takes his BP regularly    Past Medical History  Diagnosis Date  . Hypertension   . Heart murmur   . Hemorrhoids   . Ileus following gastrointestinal surgery 09/15/2011  . Coronary heart disease     normal coronaries by 09/2003 cath;  endocarditis in 06    Past Surgical History  Procedure Laterality Date  . Hemorrhoid surgery    . Cardiac surgery  2006    mitral  valve repair  . Laparotomy  09/08/2011    Procedure: EXPLORATORY LAPAROTOMY;  Surgeon: Velora Hecklerodd M Gerkin, MD;  Location: [facility];  Service: General;  Laterality: N/A;  sigmoid colectomy  . Appendectomy  09/08/2011    Procedure: APPENDECTOMY;  Surgeon: Velora Hecklerodd M Gerkin, MD;  Location: [facility];  Service: General;  Laterality: N/A;  incidental  . Colostomy    . Colon surgery    . Cardiac catheterization      normal coronaries, preserved LV function, mod MR 10/01/03; now s/p MV repair   . Colostomy closure  01/03/2012    Procedure: COLOSTOMY CLOSURE;  Surgeon: Velora Hecklerodd M Gerkin, MD;  [facility];  Service: General;  Laterality: N/A;     Current Outpatient Prescriptions  Medication Sig Dispense Refill  . aspirin 81 MG tablet Take 81 mg by mouth daily.      . metoprolol succinate (TOPROL-XL) 50 MG  24 hr tablet Take 1 tablet (50 mg total) by mouth daily. Take with or immediately following a meal. 30 tablet 1  . Multiple Vitamins-Minerals (MULTIVITAMIN WITH MINERALS) tablet Take 1 tablet by mouth daily.    . Omega-3 Fatty Acids (OMEGA-3 FISH OIL) 1200 MG CAPS Take 1 capsule by mouth at bedtime.     No current facility-administered medications for this visit.    Allergies:   Review of patient's allergies indicates no known allergies.    Social History:  The patient  reports that he has never smoked. He does not have any smokeless tobacco history on file. He reports that he drinks about 7.0 oz of alcohol per week. He reports that he does not use illicit drugs.   Family History:  The patient's family history includes Cancer in his brother.    ROS:  Please see the history of present illness.    Review of Systems: Constitutional:  denies fever, chills, diaphoresis, appetite change and fatigue.  HEENT: denies photophobia, eye pain, redness, hearing loss, ear pain, congestion, sore throat, rhinorrhea, sneezing, neck pain, neck stiffness and tinnitus.  Respiratory: denies SOB, DOE, cough, chest tightness, and wheezing.  Cardiovascular: denies chest pain, palpitations and leg swelling.  Gastrointestinal:  denies nausea, vomiting, abdominal pain, diarrhea, constipation, blood in stool.  Genitourinary: denies dysuria, urgency, frequency, hematuria, flank pain and difficulty urinating.  Musculoskeletal: denies  myalgias, back pain, joint swelling, arthralgias and gait problem.   Skin: denies pallor, rash and wound.  Neurological: denies dizziness, seizures, syncope, weakness, light-headedness, numbness and headaches.   Hematological: denies adenopathy, easy bruising, personal or family bleeding history.  Psychiatric/ Behavioral: denies suicidal ideation, mood changes, confusion, nervousness, sleep disturbance and agitation.       All other systems are reviewed and negative.    PHYSICAL  EXAM: VS:  BP 138/82 mmHg  Pulse 102  Ht  (1.88 m)  Wt 177 lb (80.287 kg)  BMI 22.72 kg/m2 , BMI Body mass index is 22.72 kg/(m^2). GEN: Well nourished, well developed, in no acute distress HEENT: normal Neck: no JVD, carotid bruits, or masses Cardiac: RRR; no murmurs, rubs, or gallops,no edema  Respiratory:  clear to auscultation bilaterally, normal work of breathing GI: soft, nontender, nondistended, + BS MS: no deformity or atrophy Skin: warm and dry, no rash Neuro:  Strength and sensation are intact Psych: normal   EKG:  EKG is ordered today. The ekg ordered today demonstrates sinus tach at 102.  No ST or T wave changes.    Recent Labs: No results found for requested labs within last 365 days.    Lipid Panel    Component Value Date/Time   CHOL 104 09/12/2011 0610   TRIG 123 09/12/2011 0610      Wt Readings from Last 3 Encounters:  03/20/14 177 lb (80.287 kg)  03/29/12 164 lb (74.39 kg)  03/28/12 167 lb (75.751 kg)      Other studies Reviewed: Additional studies/ records that were reviewed today include: . Review of the above records demonstrates:    ASSESSMENT AND PLAN:  1. 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 He's not having any recurrent symptoms of endocarditis.   He is well aware of the need to take SB prophylaxis prior to having any work done or other invasive procedures.  2. Hypertension - blood pressures fairly well-controlled. He is mildly tachycardic. We will increase the Toprol-XL to 75 mg a day.  3. ED - will try Viagra 20 mg as needed.    I'll see him again in one year.  Current medicines are reviewed at length with the patient today.  The patient does not have concerns regarding medicines.  The following changes have been made:  See above  Disposition:   FU with     Signed, Nahser, Ross Ping, MD  03/20/2014 2:47 PM    [facility]  [facility] [address] [place], [place], [place]  [address] Phone: [phone]; Fax: [phone]

## 2014-03-20 NOTE — Patient Instructions (Signed)
Your physician has recommended you make the following change in your medication:  INCREASE Toprol to 75 mg once daily  Your physician wants you to follow-up in: 1 year with Dr. Elease HashimotoNahser.  You will receive a reminder letter in the mail two months in advance. If you don't receive a letter, please call our office to schedule the follow-up appointment.

## 2014-03-23 ENCOUNTER — Other Ambulatory Visit: Payer: Self-pay | Admitting: Cardiovascular Disease

## 2014-04-24 IMAGING — CT CT ABCESS DRAINAGE
2 of 3 series · 4 of 10 positions shown, 7 images · non-contrast
Comparison: CT abdomen pelvis - 08/31/2011;

INDICATION: Perforated sigmoid diverticulitis with new/increasing
intra-abdominal abscess

CT GUIDED RIGHT LOWER ABDOMINAL DRAINAGE CATHETER PLACEMENT

[Series 4: add scan 5.0 b40f · axial · 0.74mm/px · z∈[-271,-266]mm · 2 of 4 slices shown, 5 images (1 of 2)]
[im 2/4  soft-tissue]
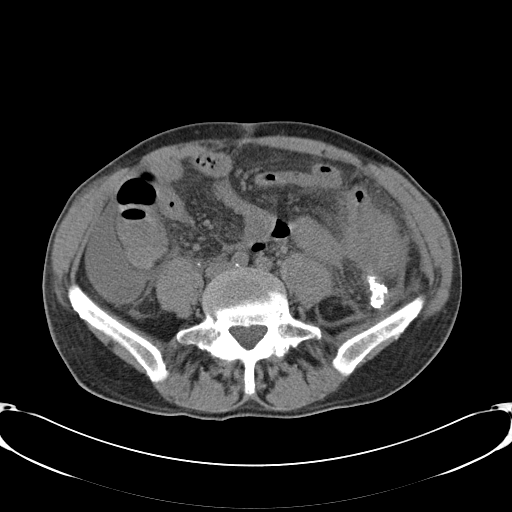
[im 2/4  lung]
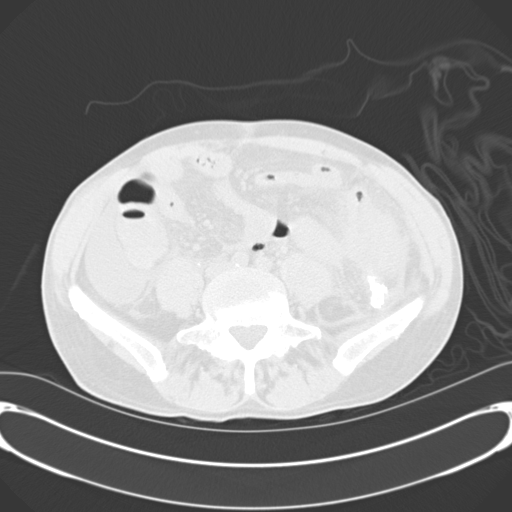
[im 2/4  bone]
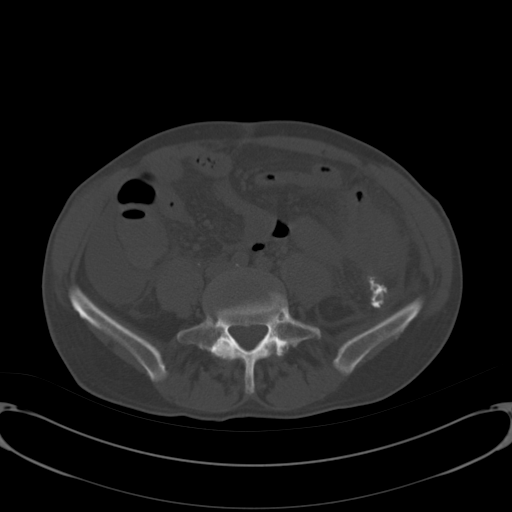
[im 3/4  soft-tissue]
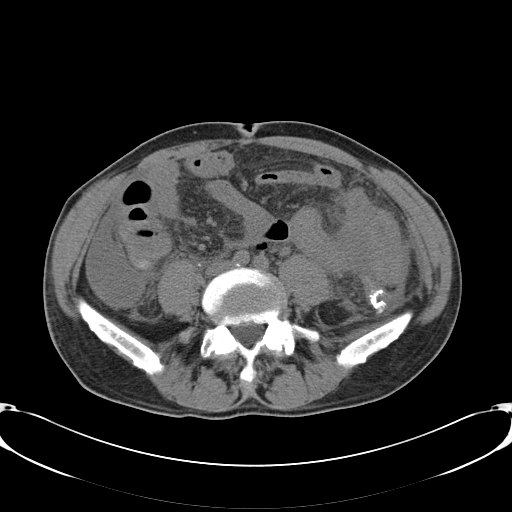
[im 3/4  lung]
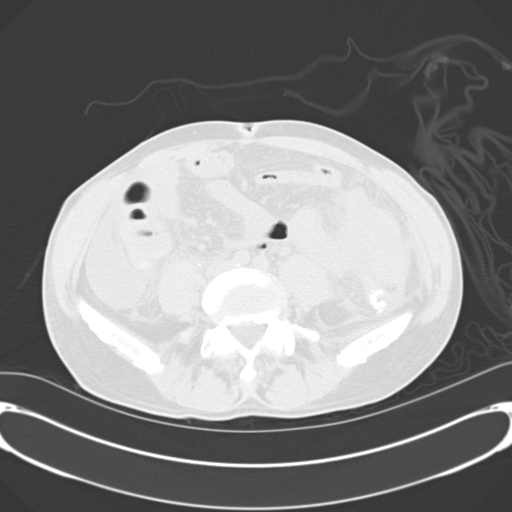

[Series 5: add scan 5.0 b40f · axial · 0.74mm/px · z∈[-271,-266]mm · 2 of 4 slices shown (2 of 2)]
[im 2/4  soft-tissue]
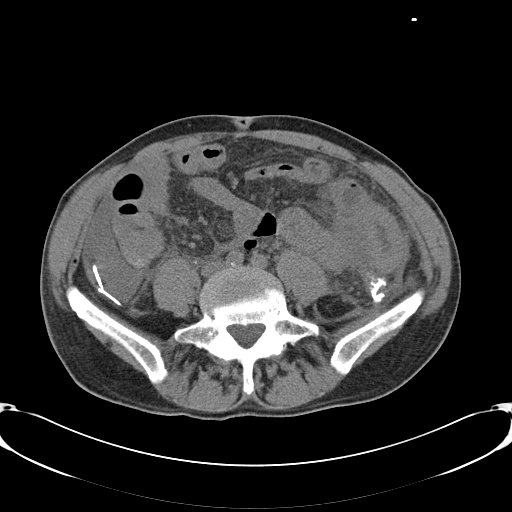
[im 3/4  soft-tissue]
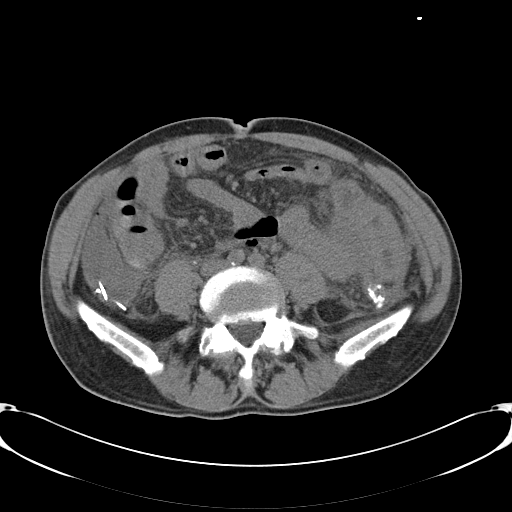

[4 of 10 positions shown; findings below may reference images not displayed]

08/27/2011; CT guided
drainage catheter placement - 08/31/2011

Medications: Fentanyl 200 mcg IV; Versed 4 mg IV

Total Moderate Sedation time: 15 minutes

Contrast: None

Complications: None immediate

Technique / Findings:

Informed written consent was obtained from the patient after a
discussion of the risks, benefits and alternatives to treatment.
The patient was placed supine on the CT gantry and a pre procedural
CT was performed re-demonstrating the known abscess/fluid
collection within the right pericolic gutter.  The procedure was
planned.   A timeout was performed prior to the initiation of the
procedure.

The skin overlying the right lower abdomen was prepped and draped
in the usual sterile fashion.   The overlying soft tissues were
anesthetized with 1% lidocaine with epinephrine.  An 18 gauge
trocar needle was advanced in to the abscess/fluid collection and a
short Amplatz super stiff wire was coiled within the abscess/fluid
collection.   Appropriate positioning was confirmed with a limited
CT scan.  The tract was serially dilated allowing placement of a 10
French all-purpose drainage catheter.  Appropriate positioning was
confirmed with a limited postprocedural CT scan.  90 ml of serous,
slightly bloody fluid was aspirated.  The tube was connected to a
drainage bag and sutured in place.  A dressing was placed.  The
patient tolerated the procedure well without immediate post
procedural complication.
IMPRESSION: Successful CT guided placement of a 10 French all purpose drain
catheter into the right lower abdomen with aspiration of 95 mL of
serous, slightly bloody fluid.

## 2014-08-17 IMAGING — CR DG CHEST 2V
2 series · 2 of 2 positions shown · non-contrast
Comparison: 09/11/2011

CLINICAL DATA: Colostomy closure, preop.

CHEST - 2 VIEW

[view not recorded (1 of 2)]
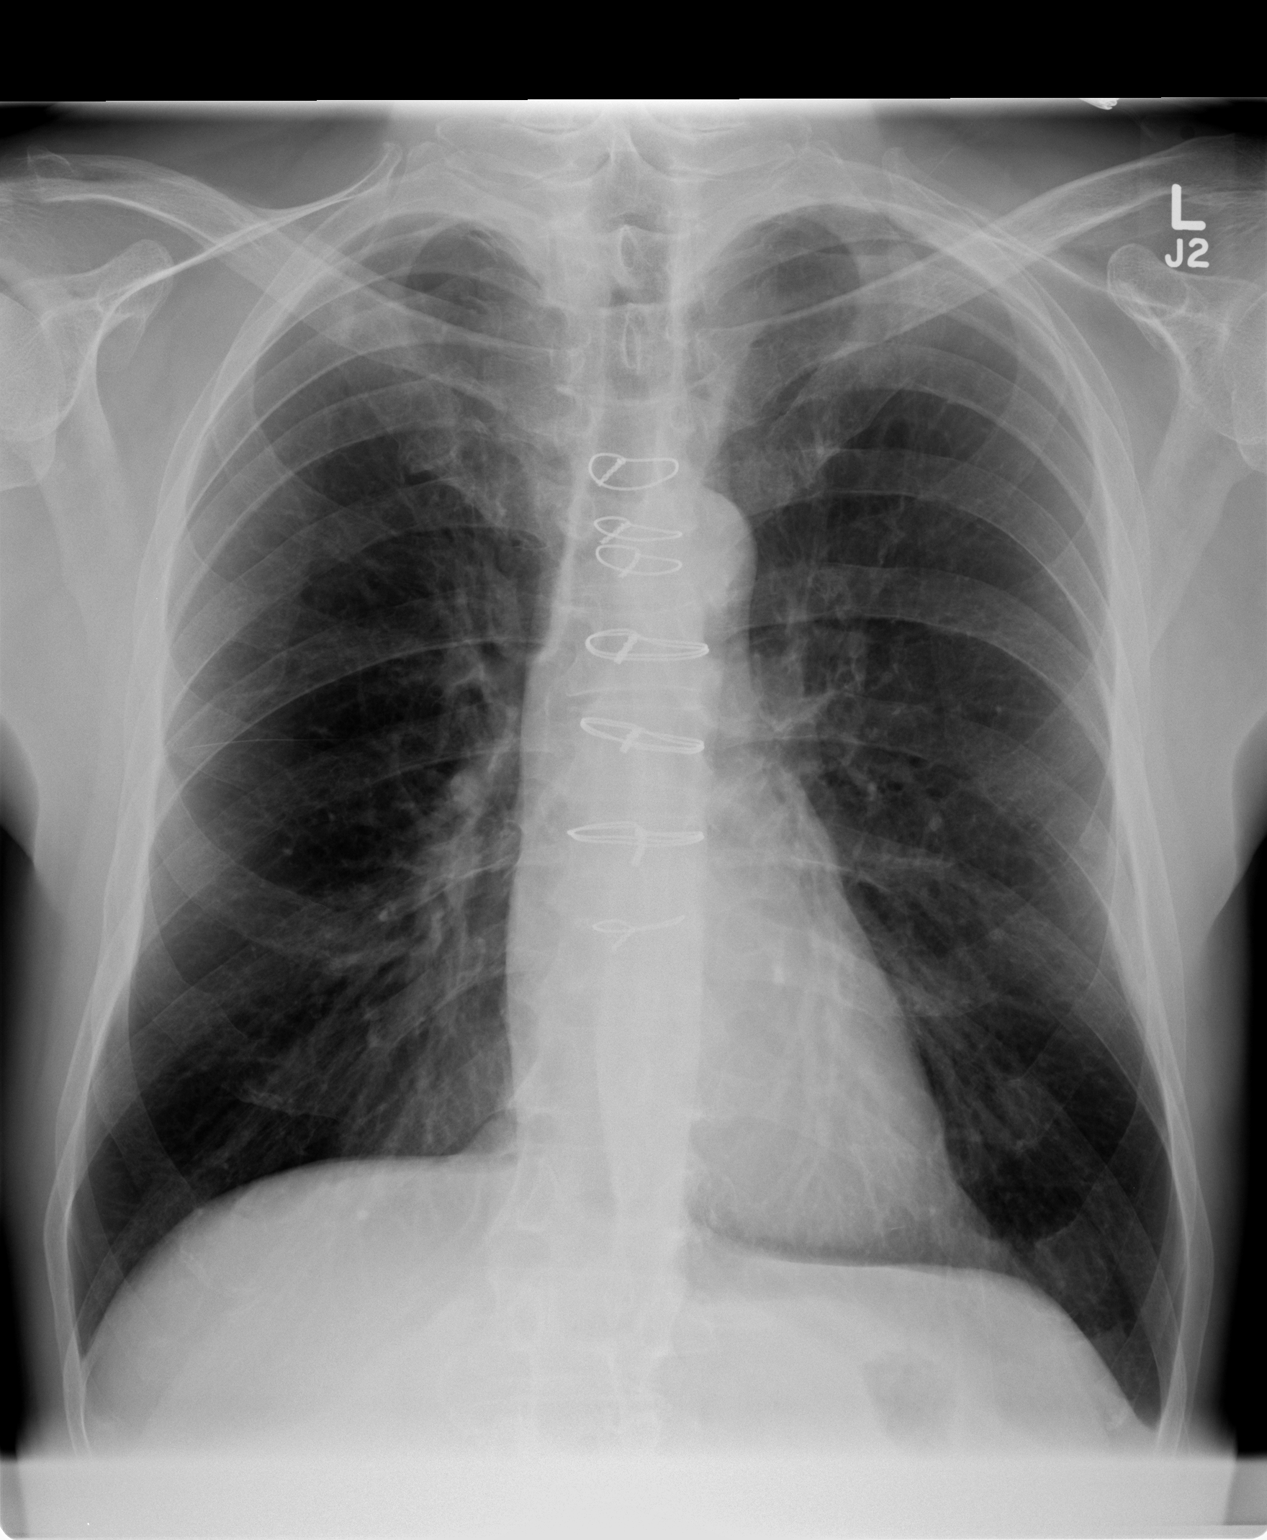

[view not recorded (2 of 2)]
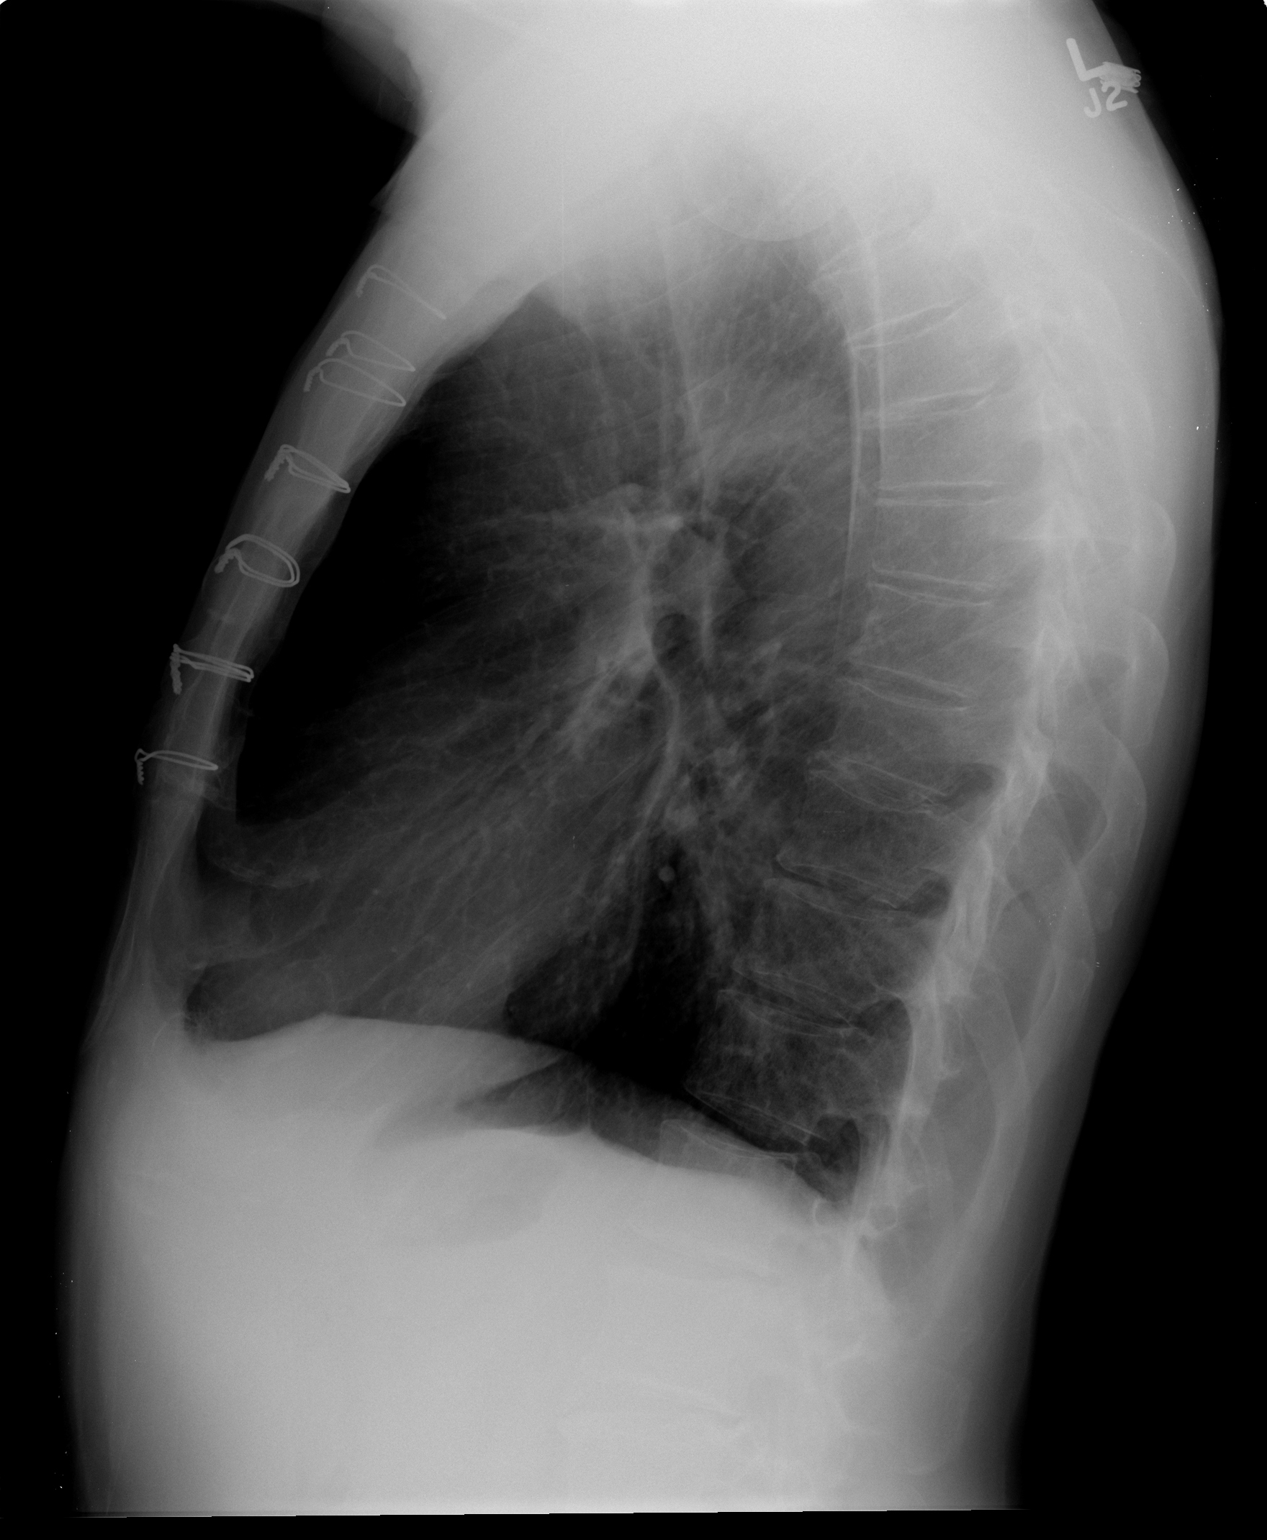

[2 of 2 positions shown; findings below may reference images not displayed]

FINDINGS: Right PICC and NG tube removed.  Lungs remain
hyperaerated.  Normal heart size.  Clear lungs.  No pneumothorax or
pleural effusion.
IMPRESSION: Changes related to COPD are noted.  No active cardiopulmonary
disease.

## 2014-11-18 ENCOUNTER — Other Ambulatory Visit: Payer: Self-pay | Admitting: General Surgery

## 2014-11-18 NOTE — H&P (Signed)
[name] 11/18/2014 11:59 AM Location: [facility] Patient #: [id number] DOB: 07/25/1955 Married / Language: English / Race: White Male  History of Present Illness (Maciah Feeback MD; 11/18/2014 12:28 PM) Patient words: hems.  The patient is a 59 year old male who presents with hemorrhoids.   Patient previously been managed by Dr Gerkin for perforated diverticular disease with colostomy. Patient has a long-standing history of hemorrhoid disease dating back 40 years. He had an open hemorrhoidectomy performed approximately 40 years ago. Over the past year he has noted progressive prolapse, discomfort following bowel movements, and mild bleeding on the toilet tissue. He presents today for evaluation. He is due for his colonoscopy.   Problem List/Past Medical (Dior Dominik, MD; 11/18/2014 12:28 PM) REDUCIBLE LEFT INGUINAL HERNIA (K40.90) PROLAPSED INTERNAL HEMORRHOIDS, GRADE 3 (K64.2)  Other Problems (Renie Stelmach, MD; 11/18/2014 12:28 PM) Inguinal Hernia Hemorrhoids  Diagnostic Studies History (Kamariah Fruchter, MD; 11/18/2014 12:28 PM) Colonoscopy 1-5 years ago  Allergies (Sonya Bynum, CMA; 11/18/2014 11:59 AM) No Known Drug Allergies 11/17/2014  Medication History (Sonya Bynum, CMA; 11/18/2014 11:59 AM) Metoprolol Succinate ER (25MG Tablet ER 24HR, Oral) Active. Aspirin (81MG Tablet, Oral) Active. Multiple Vitamin (Oral) Active. Medications Reconciled  Social History (Alandis Bluemel, MD; 11/18/2014 12:28 PM) No drug use Caffeine use Coffee. Alcohol use Occasional alcohol use. Tobacco use Never smoker.  Family History (Dacota Ruben, MD; 11/18/2014 12:28 PM) Breast Cancer Mother. Heart Disease Father. Malignant Neoplasm Of Pancreas Brother.     Review of Systems (Angelamarie Avakian MD; 11/18/2014 12:28 PM) General Present- Night Sweats. Not Present- Appetite Loss, Chills, Fatigue, Fever, Weight Gain and Weight Loss. Skin Not Present- Change in  Wart/Mole, Dryness, Hives, Jaundice, New Lesions, Non-Healing Wounds, Rash and Ulcer. HEENT Not Present- Earache, Hearing Loss, Hoarseness, Nose Bleed, Oral Ulcers, Ringing in the Ears, Seasonal Allergies, Sinus Pain, Sore Throat, Visual Disturbances, Wears glasses/contact lenses and Yellow Eyes. Respiratory Not Present- Bloody sputum, Chronic Cough, Difficulty Breathing, Snoring and Wheezing. Breast Not Present- Breast Mass, Breast Pain, Nipple Discharge and Skin Changes. Cardiovascular Not Present- Chest Pain, Difficulty Breathing Lying Down, Leg Cramps, Palpitations, Rapid Heart Rate, Shortness of Breath and Swelling of Extremities. Gastrointestinal Present- Bloody Stool and Hemorrhoids. Not Present- Abdominal Pain, Bloating, Change in Bowel Habits, Chronic diarrhea, Constipation, Difficulty Swallowing, Excessive gas, Gets full quickly at meals, Indigestion, Nausea, Rectal Pain and Vomiting. Male Genitourinary Not Present- Blood in Urine, Change in Urinary Stream, Frequency, Impotence, Nocturia, Painful Urination, Urgency and Urine Leakage. Musculoskeletal Not Present- Back Pain, Joint Pain, Joint Stiffness, Muscle Pain, Muscle Weakness and Swelling of Extremities. Neurological Not Present- Decreased Memory, Fainting, Headaches, Numbness, Seizures, Tingling, Tremor, Trouble walking and Weakness. Psychiatric Not Present- Anxiety, Bipolar, Change in Sleep Pattern, Depression, Fearful and Frequent crying. Endocrine Not Present- Cold Intolerance, Excessive Hunger, Hair Changes, Heat Intolerance and New Diabetes. Hematology Not Present- Easy Bruising, Excessive bleeding, Gland problems, HIV and Persistent Infections.  Vitals (Sonya Bynum CMA; 11/18/2014 11:59 AM) 11/18/2014 11:59 AM Weight: 178 lb Height: 74in Body Surface Area: 2.07 m Body Mass Index: 22.85 kg/m  Temp.: 98F(Temporal)  Pulse: 73 (Regular)  BP: 128/76 (Sitting, Left Arm, Standard)      Physical Exam (Jaysen Wey  MD; 11/18/2014 12:29 PM)  The physical exam findings are as follows: Note:General - appears comfortable, no distress; not diaphorectic  HEENT - normocephalic; sclerae clear, gaze conjugate; mucous membranes moist, dentition good; voice normal  Neck - symmetric on extension; no palpable anterior or posterior cervical adenopathy; no palpable masses   in the thyroid bed  Chest - clear bilaterally without rhonchi, rales, or wheeze  Cor - regular rhythm with normal rate; no significant murmur  Abd - soft without distension; well-healed midline surgical incision without evidence of incisional hernia; well-healed colostomy site left mid abdomen without evidence of hernia; no palpable masses; no tenderness  GU - normal male genitalia without mass or lesion; palpation in the left inguinal canal shows an obvious moderate-sized inguinal hernia which is soft and nontender; it is reducible; it spontaneously processed lapses with Valsalva  Rectal - external examination shows prolapsed internal hemorrhoid with superficial ulceration and friability; digital rectal exam shows normal tone without palpable mass; grade 3 internal hemorrhoid with prolapse and ulceration is noted (R ant)  Ext - non-tender without significant edema or lymphedema  Neuro - grossly intact; no tremor    Assessment & Plan Romie Levee(Hinata Diener MD; 11/18/2014 12:26 PM)  PROLAPSED INTERNAL HEMORRHOIDS, GRADE 3 (K64.2) Impression: 59 year old male who presents to the office with grade 3 internal hemorrhoids and a recurrent left inguinal hernia. He was seen by my partner Dr. Ricky StabsJerkins yesterday and open repair of hernia was recommended. On exam today the patient has grade 3 internal hemorrhoids with significant inflammation. I have recommended THD. We discussed this in detail. We also discussed possibly doing the hemorrhoid surgery and the hernia surgery at the same time. The patient will think about this. In the meantime we will plan on  scheduling the hemorrhoid procedure. If he decides he wants to try to schedule them together he will call the office.

## 2014-11-26 ENCOUNTER — Ambulatory Visit: Payer: Self-pay | Admitting: Surgery

## 2014-12-15 ENCOUNTER — Encounter (HOSPITAL_BASED_OUTPATIENT_CLINIC_OR_DEPARTMENT_OTHER): Payer: Self-pay | Admitting: *Deleted

## 2014-12-15 NOTE — Progress Notes (Addendum)
NPO AFTER MN.  ARRIVE AT 1000. NEEDS HG.  CURRENT EKG IN CHART AND EPIC.  WILL TAKE TOPROL AM DOS W/ SIPS OF W ATER.  PT NEEDS PROPHYLAXIS ANTIBIOTIC COVERAGE PER CARDIOLOGIST NOTE, MDA DOS.

## 2014-12-17 ENCOUNTER — Encounter (HOSPITAL_BASED_OUTPATIENT_CLINIC_OR_DEPARTMENT_OTHER): Payer: Self-pay | Admitting: Surgery

## 2014-12-17 DIAGNOSIS — K4090 Unilateral inguinal hernia, without obstruction or gangrene, not specified as recurrent: Secondary | ICD-10-CM

## 2014-12-17 NOTE — H&P (Signed)
General Surgery [facility]  Fuller Mandril Outman DOB: Apr 08, 1955 Married / Language: English / Race: White Male  History of Present Illness  Patient returns to my practice having previously been managed for perforated diverticular disease with colostomy. Patient has a long-standing history of hemorrhoid disease dating back 40 years. He had an open hemorrhoidectomy performed approximately 40 years ago. Over the past year he has noted progressive prolapse, discomfort following bowel movements, and mild bleeding on the toilet tissue. He presents today for evaluation. Patient has not had a recent colonoscopy. Patient has also noted a bulge in the left groin. He had undergone a hernia repair again at approximately age 21. To his knowledge, mesh was not employed. Patient has now developed a recurrence. He has mild discomfort. He has had no signs or symptoms of obstruction. He would like to discuss surgical repair.  Other Problems Hemorrhoids Inguinal Hernia  Diagnostic Studies History Colonoscopy 1-5 years ago  Allergies No Known Drug Allergies10/31/2016  Medication History Metoprolol Succinate ER (  Tablet ER 24HR, Oral) Active. Aspirin (  Tablet, Oral) Active. Multiple Vitamin (Oral) Active. Medications Reconciled  Social History Alcohol use Occasional alcohol use. Caffeine use Coffee. No drug use Tobacco use Never smoker.  Family History Breast Cancer Mother. Heart Disease Father. Malignant Neoplasm Of Pancreas Brother.  Review of Systems  General Present- Night Sweats. Not Present- Appetite Loss, Chills, Fatigue, Fever, Weight Gain and Weight Loss. Skin Not Present- Change in Wart/Mole, Dryness, Hives, Jaundice, New Lesions, Non-Healing Wounds, Rash and Ulcer. HEENT Not Present- Earache, Hearing Loss, Hoarseness, Nose Bleed, Oral Ulcers, Ringing in the Ears, Seasonal Allergies, Sinus Pain, Sore Throat, Visual Disturbances, Wears  glasses/contact lenses and Yellow Eyes. Respiratory Not Present- Bloody sputum, Chronic Cough, Difficulty Breathing, Snoring and Wheezing. Breast Not Present- Breast Mass, Breast Pain, Nipple Discharge and Skin Changes. Cardiovascular Not Present- Chest Pain, Difficulty Breathing Lying Down, Leg Cramps, Palpitations, Rapid Heart Rate, Shortness of Breath and Swelling of Extremities. Gastrointestinal Present- Bloody Stool and Hemorrhoids. Not Present- Abdominal Pain, Bloating, Change in Bowel Habits, Chronic diarrhea, Constipation, Difficulty Swallowing, Excessive gas, Gets full quickly at meals, Indigestion, Nausea, Rectal Pain and Vomiting. Male Genitourinary Not Present- Blood in Urine, Change in Urinary Stream, Frequency, Impotence, Nocturia, Painful Urination, Urgency and Urine Leakage. Musculoskeletal Not Present- Back Pain, Joint Pain, Joint Stiffness, Muscle Pain, Muscle Weakness and Swelling of Extremities. Neurological Not Present- Decreased Memory, Fainting, Headaches, Numbness, Seizures, Tingling, Tremor, Trouble walking and Weakness. Psychiatric Not Present- Anxiety, Bipolar, Change in Sleep Pattern, Depression, Fearful and Frequent crying. Endocrine Not Present- Cold Intolerance, Excessive Hunger, Hair Changes, Heat Intolerance, Hot flashes and New Diabetes. Hematology Not Present- Easy Bruising, Excessive bleeding, Gland problems, HIV and Persistent Infections.  Vitals Weight: 179 lb Height: 74in Body Surface Area: 2.07 m Body Mass Index: 22.98 kg/m  Temp.: 97.61F(Temporal)  Pulse: 80 (Regular)  BP: 130/70 (Sitting, Left Arm, Standard)  Physical Exam  General - appears comfortable, no distress; not diaphorectic  HEENT - normocephalic; sclerae clear, gaze conjugate; mucous membranes moist, dentition good; voice normal  Neck - symmetric on extension; no palpable anterior or posterior cervical adenopathy; no palpable masses in the thyroid bed  Chest - clear  bilaterally without rhonchi, rales, or wheeze  Cor - regular rhythm with normal rate; no significant murmur  Abd - soft without distension; well-healed midline surgical incision without evidence of incisional hernia; well-healed colostomy site left mid abdomen without evidence of hernia; no palpable masses; no tenderness  GU - normal  male genitalia without mass or lesion; palpation in the left inguinal canal shows an obvious moderate-sized inguinal hernia which is soft and nontender; it is reducible; it spontaneously processed lapses with Valsalva  Rectal - external examination shows prolapsed internal hemorrhoid with superficial ulceration and friability; digital rectal exam shows normal tone without palpable mass; anoscopy is performed and shows normal lower rectal mucosa with grade 3-4 internal hemorrhoid with prolapse and ulceration  Ext - non-tender without significant edema or lymphedema  Neuro - grossly intact; no tremor    Assessment & Plan  REDUCIBLE LEFT INGUINAL HERNIA (K40.90)  PROLAPSED INTERNAL HEMORRHOIDS, GRADE 3 (K64.2)  Patient is known to my surgical practice from history of perforated diverticulitis requiring laparotomy, resection, and subsequent colostomy closure.  Patient has a long-standing history of hemorrhoids. He is undergoing open hemorrhoidectomy approximately 40 years ago. Over the past year he has noted worsening problems with prolapse, pain, and bleeding. He has noted today to have grade 3-4 internal hemorrhoids. I provided him with written literature to review at home. I am going to ask him to be evaluated by my partner, Dr. [name], who is a colorectal surgeon about options for surgical management.  Patient also has a left inguinal hernia which is become progressively larger. He had undergone open repair without mesh approximately 40 years ago. He now has a recurrence. It is becoming symptomatic. He will require operative repair with mesh at some  point in the future. He is not a candidate for laparoscopic surgery due to his prior colon surgery.  Patient will return to see Dr. Maisie Fushomas in consultation.  The risks and benefits of the procedure have been discussed at length with the patient.  The patient understands the proposed procedure, potential alternative treatments, and the course of recovery to be expected.  All of the patient's questions have been answered at this time.  The patient wishes to proceed with surgery.  Velora Hecklerodd M. Jsiah Menta, MD, [facility] Office: [phone]

## 2014-12-18 ENCOUNTER — Ambulatory Visit (HOSPITAL_BASED_OUTPATIENT_CLINIC_OR_DEPARTMENT_OTHER): Payer: Managed Care, Other (non HMO) | Admitting: Anesthesiology

## 2014-12-18 ENCOUNTER — Encounter (HOSPITAL_BASED_OUTPATIENT_CLINIC_OR_DEPARTMENT_OTHER)
Admission: RE | Disposition: A | Discharge status: Home / Self Care | Payer: Self-pay | Source: Ambulatory Visit | Attending: Surgery

## 2014-12-18 ENCOUNTER — Encounter (HOSPITAL_BASED_OUTPATIENT_CLINIC_OR_DEPARTMENT_OTHER): Payer: Self-pay | Admitting: Anesthesiology

## 2014-12-18 ENCOUNTER — Ambulatory Visit (HOSPITAL_BASED_OUTPATIENT_CLINIC_OR_DEPARTMENT_OTHER)
Admission: RE | Admit: 2014-12-18 | Discharge: 2014-12-18 | Disposition: A | Discharge status: Home / Self Care | Payer: Managed Care, Other (non HMO) | Source: Ambulatory Visit | Attending: Surgery | Admitting: Surgery

## 2014-12-18 DIAGNOSIS — K642 Third degree hemorrhoids: Secondary | ICD-10-CM | POA: Insufficient documentation

## 2014-12-18 DIAGNOSIS — I10 Essential (primary) hypertension: Secondary | ICD-10-CM | POA: Diagnosis not present

## 2014-12-18 DIAGNOSIS — Z7982 Long term (current) use of aspirin: Secondary | ICD-10-CM | POA: Diagnosis not present

## 2014-12-18 DIAGNOSIS — K4090 Unilateral inguinal hernia, without obstruction or gangrene, not specified as recurrent: Secondary | ICD-10-CM | POA: Diagnosis not present

## 2014-12-18 DIAGNOSIS — K641 Second degree hemorrhoids: Secondary | ICD-10-CM | POA: Insufficient documentation

## 2014-12-18 DIAGNOSIS — I341 Nonrheumatic mitral (valve) prolapse: Secondary | ICD-10-CM | POA: Insufficient documentation

## 2014-12-18 DIAGNOSIS — Z933 Colostomy status: Secondary | ICD-10-CM | POA: Insufficient documentation

## 2014-12-18 DIAGNOSIS — Z79899 Other long term (current) drug therapy: Secondary | ICD-10-CM | POA: Diagnosis not present

## 2014-12-18 HISTORY — DX: Third degree hemorrhoids: K64.2

## 2014-12-18 HISTORY — DX: Unspecified hemorrhoids: K64.9

## 2014-12-18 HISTORY — DX: Personal history of other diseases of the circulatory system: Z86.79

## 2014-12-18 HISTORY — DX: Presence of external hearing-aid: Z97.4

## 2014-12-18 HISTORY — PX: INSERTION OF MESH: SHX5868

## 2014-12-18 HISTORY — PX: TRANSANAL HEMORRHOIDAL DEARTERIALIZATION: SHX6136

## 2014-12-18 HISTORY — PX: INGUINAL HERNIA REPAIR: SHX194

## 2014-12-18 HISTORY — DX: Personal history of other diseases of the digestive system: Z87.19

## 2014-12-18 HISTORY — DX: Diverticulosis of large intestine without perforation or abscess without bleeding: K57.30

## 2014-12-18 HISTORY — DX: Other specified postprocedural states: Z98.890

## 2014-12-18 HISTORY — DX: Male erectile dysfunction, unspecified: N52.9

## 2014-12-18 HISTORY — DX: Presence of spectacles and contact lenses: Z97.3

## 2014-12-18 HISTORY — DX: Unspecified sensorineural hearing loss: H90.5

## 2014-12-18 HISTORY — DX: Unilateral inguinal hernia, without obstruction or gangrene, not specified as recurrent: K40.90

## 2014-12-18 LAB — POCT HEMOGLOBIN-HEMACUE: HEMOGLOBIN: 14.6 g/dL (ref 13.0–17.0)

## 2014-12-18 SURGERY — REPAIR, HERNIA, INGUINAL, ADULT
Anesthesia: General

## 2014-12-18 MED ORDER — LIDOCAINE HCL (CARDIAC) 20 MG/ML IV SOLN
INTRAVENOUS | Status: DC | PRN
  Administered 2014-12-18: 80 mg via INTRAVENOUS

## 2014-12-18 MED ORDER — LIDOCAINE 5 % EX OINT
TOPICAL_OINTMENT | CUTANEOUS | Status: AC
  Filled 2014-12-18: qty 35.44

## 2014-12-18 MED ORDER — DEXAMETHASONE SODIUM PHOSPHATE 10 MG/ML IJ SOLN
INTRAMUSCULAR | Status: AC
  Filled 2014-12-18: qty 1

## 2014-12-18 MED ORDER — PHENYLEPHRINE 40 MCG/ML (10ML) SYRINGE FOR IV PUSH (FOR BLOOD PRESSURE SUPPORT)
PREFILLED_SYRINGE | INTRAVENOUS | Status: AC
  Filled 2014-12-18: qty 10

## 2014-12-18 MED ORDER — FENTANYL CITRATE (PF) 100 MCG/2ML IJ SOLN
INTRAMUSCULAR | Status: AC
  Filled 2014-12-18: qty 2

## 2014-12-18 MED ORDER — DIAZEPAM 5 MG PO TABS: 5.0000 mg | ORAL_TABLET | Freq: Four times a day (QID) | ORAL | Status: DC | PRN

## 2014-12-18 MED ORDER — ONDANSETRON HCL 4 MG/2ML IJ SOLN
INTRAMUSCULAR | Status: DC | PRN
  Administered 2014-12-18: 4 mg via INTRAVENOUS

## 2014-12-18 MED ORDER — SODIUM CHLORIDE 0.9 % IV SOLN
250.0000 mL | INTRAVENOUS | Status: DC | PRN
  Filled 2014-12-18: qty 250

## 2014-12-18 MED ORDER — OXYCODONE HCL 5 MG PO TABS
ORAL_TABLET | ORAL | Status: AC
  Filled 2014-12-18: qty 1

## 2014-12-18 MED ORDER — FENTANYL CITRATE (PF) 100 MCG/2ML IJ SOLN
INTRAMUSCULAR | Status: DC | PRN
  Administered 2014-12-18 (×2): 50 ug via INTRAVENOUS
  Administered 2014-12-18: 100 ug via INTRAVENOUS

## 2014-12-18 MED ORDER — ACETAMINOPHEN 650 MG RE SUPP
650.0000 mg | RECTAL | Status: DC | PRN
  Filled 2014-12-18: qty 1

## 2014-12-18 MED ORDER — BUPIVACAINE-EPINEPHRINE 0.5% -1:200000 IJ SOLN
INTRAMUSCULAR | Status: DC | PRN
  Administered 2014-12-18: 20 mL

## 2014-12-18 MED ORDER — METOPROLOL SUCCINATE ER 25 MG PO TB24: 75.0000 mg | ORAL_TABLET | Freq: Every morning | ORAL | Status: DC

## 2014-12-18 MED ORDER — BUPIVACAINE LIPOSOME 1.3 % IJ SUSP
INTRAMUSCULAR | Status: DC | PRN
  Administered 2014-12-18: 20 mL

## 2014-12-18 MED ORDER — PHENYLEPHRINE HCL 10 MG/ML IJ SOLN
INTRAMUSCULAR | Status: DC | PRN
  Administered 2014-12-18: 80 ug via INTRAVENOUS
  Administered 2014-12-18: 40 ug via INTRAVENOUS

## 2014-12-18 MED ORDER — BUPIVACAINE HCL (PF) 0.25 % IJ SOLN
INTRAMUSCULAR | Status: AC
  Filled 2014-12-18: qty 30

## 2014-12-18 MED ORDER — ONDANSETRON HCL 4 MG/2ML IJ SOLN
4.0000 mg | Freq: Once | INTRAMUSCULAR | Status: DC | PRN
  Filled 2014-12-18: qty 2

## 2014-12-18 MED ORDER — LACTATED RINGERS IV SOLN
INTRAVENOUS | Status: DC
  Administered 2014-12-18 (×2): via INTRAVENOUS
  Filled 2014-12-18: qty 1000

## 2014-12-18 MED ORDER — PROPOFOL 10 MG/ML IV BOLUS
INTRAVENOUS | Status: DC | PRN
  Administered 2014-12-18: 100 mg via INTRAVENOUS
  Administered 2014-12-18: 200 mg via INTRAVENOUS

## 2014-12-18 MED ORDER — MIDAZOLAM HCL 2 MG/2ML IJ SOLN
INTRAMUSCULAR | Status: AC
  Filled 2014-12-18: qty 2

## 2014-12-18 MED ORDER — OXYCODONE HCL 5 MG PO TABS
5.0000 mg | ORAL_TABLET | ORAL | Status: DC | PRN
  Filled 2014-12-18: qty 2

## 2014-12-18 MED ORDER — SODIUM CHLORIDE 0.9 % IJ SOLN
3.0000 mL | INTRAMUSCULAR | Status: DC | PRN
  Filled 2014-12-18: qty 3

## 2014-12-18 MED ORDER — BUPIVACAINE HCL (PF) 0.25 % IJ SOLN: INTRAMUSCULAR | Status: DC | PRN

## 2014-12-18 MED ORDER — KETOROLAC TROMETHAMINE 30 MG/ML IJ SOLN
INTRAMUSCULAR | Status: AC
  Filled 2014-12-18: qty 1

## 2014-12-18 MED ORDER — MIDAZOLAM HCL 5 MG/5ML IJ SOLN
INTRAMUSCULAR | Status: DC | PRN
  Administered 2014-12-18: 2 mg via INTRAVENOUS

## 2014-12-18 MED ORDER — SODIUM CHLORIDE 0.9 % IJ SOLN
3.0000 mL | Freq: Two times a day (BID) | INTRAMUSCULAR | Status: DC
  Filled 2014-12-18: qty 3

## 2014-12-18 MED ORDER — CEFAZOLIN SODIUM-DEXTROSE 2-3 GM-% IV SOLR
2.0000 g | INTRAVENOUS | Status: AC
  Administered 2014-12-18: 2 g via INTRAVENOUS
  Filled 2014-12-18: qty 50

## 2014-12-18 MED ORDER — OXYCODONE HCL 5 MG PO TABS: 5.0000 mg | ORAL_TABLET | ORAL | Status: DC | PRN

## 2014-12-18 MED ORDER — GLYCOPYRROLATE 0.2 MG/ML IJ SOLN
INTRAMUSCULAR | Status: AC
  Filled 2014-12-18: qty 1

## 2014-12-18 MED ORDER — CEFAZOLIN SODIUM 1-5 GM-% IV SOLN
INTRAVENOUS | Status: AC
  Filled 2014-12-18: qty 100

## 2014-12-18 MED ORDER — ACETAMINOPHEN 325 MG PO TABS
650.0000 mg | ORAL_TABLET | ORAL | Status: DC | PRN
  Filled 2014-12-18: qty 2

## 2014-12-18 MED ORDER — DEXAMETHASONE SODIUM PHOSPHATE 4 MG/ML IJ SOLN
INTRAMUSCULAR | Status: DC | PRN
  Administered 2014-12-18: 10 mg via INTRAVENOUS

## 2014-12-18 MED ORDER — ONDANSETRON HCL 4 MG/2ML IJ SOLN
INTRAMUSCULAR | Status: AC
  Filled 2014-12-18: qty 2

## 2014-12-18 MED ORDER — OXYCODONE HCL 5 MG PO TABS
5.0000 mg | ORAL_TABLET | Freq: Once | ORAL | Status: AC
Start: 2014-12-18 — End: 2014-12-18
  Administered 2014-12-18: 5 mg via ORAL
  Filled 2014-12-18: qty 1

## 2014-12-18 MED ORDER — FENTANYL CITRATE (PF) 100 MCG/2ML IJ SOLN
25.0000 ug | INTRAMUSCULAR | Status: DC | PRN
  Administered 2014-12-18: 50 ug via INTRAVENOUS
  Filled 2014-12-18: qty 1

## 2014-12-18 MED ORDER — FENTANYL CITRATE (PF) 100 MCG/2ML IJ SOLN
INTRAMUSCULAR | Status: AC
  Filled 2014-12-18: qty 4

## 2014-12-18 SURGICAL SUPPLY — 70 items
APL SKNCLS STERI-STRIP NONHPOA (GAUZE/BANDAGES/DRESSINGS) ×3
BENZOIN TINCTURE PRP APPL 2/3 (GAUZE/BANDAGES/DRESSINGS) ×4 IMPLANT
BLADE CLIPPER SURG (BLADE) ×4 IMPLANT
BLADE HEX COATED 2.75 (ELECTRODE) ×4 IMPLANT
BLADE SURG 15 STRL LF DISP TIS (BLADE) ×3 IMPLANT
BLADE SURG 15 STRL SS (BLADE) ×4
BRIEF STRETCH FOR OB PAD LRG (UNDERPADS AND DIAPERS) IMPLANT
CANISTER SUCTION 1200CC (MISCELLANEOUS) ×1 IMPLANT
CHLORAPREP W/TINT 26ML (MISCELLANEOUS) ×4 IMPLANT
CLEANER CAUTERY TIP 5X5 PAD (MISCELLANEOUS) ×3 IMPLANT
CLOTH BEACON ORANGE TIMEOUT ST (SAFETY) ×4 IMPLANT
COVER BACK TABLE 60X90IN (DRAPES) ×8 IMPLANT
COVER MAYO STAND STRL (DRAPES) ×4 IMPLANT
DRAIN PENROSE 18X1/2 LTX STRL (DRAIN) ×4 IMPLANT
DRAPE LAPAROTOMY TRNSV 102X78 (DRAPE) ×4 IMPLANT
DRAPE LG THREE QUARTER DISP (DRAPES) ×4 IMPLANT
DRAPE PED LAPAROTOMY (DRAPES) ×4 IMPLANT
DRAPE UNDERBUTTOCKS STRL (DRAPE) ×4 IMPLANT
DRAPE UTILITY XL STRL (DRAPES) ×4 IMPLANT
DRSG KUZMA FLUFF (GAUZE/BANDAGES/DRESSINGS) ×1 IMPLANT
DRSG PAD ABDOMINAL 8X10 ST (GAUZE/BANDAGES/DRESSINGS) ×1 IMPLANT
ELECT REM PT RETURN 9FT ADLT (ELECTROSURGICAL) ×8
ELECTRODE REM PT RTRN 9FT ADLT (ELECTROSURGICAL) ×6 IMPLANT
GAUZE SPONGE 4X4 12PLY STRL (GAUZE/BANDAGES/DRESSINGS) IMPLANT
GAUZE SPONGE 4X4 16PLY XRAY LF (GAUZE/BANDAGES/DRESSINGS) ×4 IMPLANT
GLOVE BIO SURGEON STRL SZ 6.5 (GLOVE) ×4 IMPLANT
GLOVE SURG ORTHO 8.0 STRL STRW (GLOVE) ×4 IMPLANT
GOWN STRL REUS W/ TWL LRG LVL3 (GOWN DISPOSABLE) ×3 IMPLANT
GOWN STRL REUS W/ TWL XL LVL3 (GOWN DISPOSABLE) ×3 IMPLANT
GOWN STRL REUS W/TWL 2XL LVL3 (GOWN DISPOSABLE) ×4 IMPLANT
GOWN STRL REUS W/TWL LRG LVL3 (GOWN DISPOSABLE) ×4
GOWN STRL REUS W/TWL XL LVL3 (GOWN DISPOSABLE) ×8 IMPLANT
HEMOSTAT SURGICEL 4X8 (HEMOSTASIS) IMPLANT
KIT ROOM TURNOVER WOR (KITS) ×8 IMPLANT
KIT SLIDE ONE PROLAPS HEMORR (KITS) ×1 IMPLANT
LEGGING LITHOTOMY PAIR STRL (DRAPES) ×4 IMPLANT
LIQUID BAND (GAUZE/BANDAGES/DRESSINGS) ×4 IMPLANT
LUBRICANT JELLY K Y 4OZ (MISCELLANEOUS) ×4 IMPLANT
MESH ULTRAPRO 3X6 7.6X15CM (Mesh General) ×4 IMPLANT
NDL HYPO 25X1 1.5 SAFETY (NEEDLE) ×3 IMPLANT
NEEDLE HYPO 22GX1.5 SAFETY (NEEDLE) ×4 IMPLANT
NEEDLE HYPO 25X1 1.5 SAFETY (NEEDLE) ×4 IMPLANT
NS IRRIG 500ML POUR BTL (IV SOLUTION) ×4 IMPLANT
PACK BASIN DAY SURGERY FS (CUSTOM PROCEDURE TRAY) ×8 IMPLANT
PAD CLEANER CAUTERY TIP 5X5 (MISCELLANEOUS) ×1
PAD PREP 24X48 CUFFED NSTRL (MISCELLANEOUS) IMPLANT
PENCIL BUTTON HOLSTER BLD 10FT (ELECTRODE) ×4 IMPLANT
SPONGE GAUZE 4X4 12PLY STER LF (GAUZE/BANDAGES/DRESSINGS) IMPLANT
SPONGE HEMORRHOID 8X3CM (HEMOSTASIS) IMPLANT
SPONGE LAP 4X18 X RAY DECT (DISPOSABLE) ×4 IMPLANT
STRIP CLOSURE SKIN 1/2X4 (GAUZE/BANDAGES/DRESSINGS) ×4 IMPLANT
SUT CHROMIC 2 0 SH (SUTURE) IMPLANT
SUT CHROMIC 3 0 SH 27 (SUTURE) IMPLANT
SUT MNCRL AB 4-0 PS2 18 (SUTURE) ×1 IMPLANT
SUT NOVA NAB GS-22 2 0 T19 (SUTURE) ×8 IMPLANT
SUT PROLENE 0 CT 1 CR/8 (SUTURE) IMPLANT
SUT SILK 2 0 SH (SUTURE) ×1 IMPLANT
SUT SILK 2 0 TIES 17X18 (SUTURE)
SUT SILK 2-0 18XBRD TIE BLK (SUTURE) IMPLANT
SUT VIC AB 2-0 UR6 27 (SUTURE) IMPLANT
SUT VIC AB 3-0 SH 18 (SUTURE) ×4 IMPLANT
SUT VICRYL 4-0 PS2 18IN ABS (SUTURE) IMPLANT
SYR CONTROL 10ML LL (SYRINGE) ×8 IMPLANT
SYRINGE 20CC LL (MISCELLANEOUS) IMPLANT
TOWEL OR 17X24 6PK STRL BLUE (TOWEL DISPOSABLE) ×4 IMPLANT
TOWEL OR NON WOVEN STRL DISP B (DISPOSABLE) ×4 IMPLANT
TRAY DSU PREP LF (CUSTOM PROCEDURE TRAY) ×4 IMPLANT
TUBE CONNECTING 12X1/4 (SUCTIONS) ×8 IMPLANT
WATER STERILE IRR 500ML POUR (IV SOLUTION) IMPLANT
YANKAUER SUCT BULB TIP NO VENT (SUCTIONS) ×4 IMPLANT

## 2014-12-18 NOTE — Op Note (Signed)
12/18/2014  1:40 PM  PATIENT:  [name]  59 y.o. male  Patient Care Team: No Pcp Per Patient as PCP - General (General Practice) Darnell Levelodd Gerkin, MD as Consulting Physician (General Surgery)  PRE-OPERATIVE DIAGNOSIS:  reducible left inguinal hernia, transanal hemorhoid  POST-OPERATIVE DIAGNOSIS:  reducible left inguinal hernia, transanal hemorhoid  PROCEDURE:   TRANSANAL HEMORRHOIDAL DEARTERIALIZATION   Surgeon(s): Romie LeveeAlicia Vilas Edgerly, MD  ASSISTANT: none   ANESTHESIA:   local and LMA  SPECIMEN:  No Specimen  DISPOSITION OF SPECIMEN:  N/A  COUNTS:  YES  PLAN OF CARE: Discharge to home after PACU  PATIENT DISPOSITION:  PACU - hemodynamically stable.  INDICATION: 59 y.o. M with grade 3 internal hemorrhoid disease   OR FINDINGS: Grade 3 R posterior and anterior internal hemorrhoids.  Grade 2 L lateral hemorrhoid  DESCRIPTION: the patient was identified in the preoperative holding area and taken to the OR where they were laid on the operating room table.  General anesthesia was induced without difficulty. The patient was then positioned in prone jackknife position with buttocks gently taped apart.  The patient was then prepped and draped in usual sterile fashion.  SCDs were noted to be in place prior to the initiation of anesthesia. A surgical timeout was performed indicating the correct patient, procedure, positioning and need for preoperative antibiotics.  A rectal block was performed using Marcaine with epinephrine.    I began with a digital rectal exam.  There were no masses.  I then placed a Hill-Ferguson anoscope into the anal canal and evaluated this completely.  There were grade 3 hemorrhoids in the R anterior and posterior positions.  The L lateral hemorrhoid was a grade 2.  I switched over to the [facility] Doppler [facility]ocope.   Using the Doppler on the tip of the THD anoscope, I identified the arterial hemorrhoidal vessels coming in in the classic hexagonal  anatomical pattern (right posterior/lateral/anterior, left posterior /lateral/anterior).    I proceeded to ligate the hemorrhoidal arteries. I used a 2-0 Vicryl suture on a UR-6 needle in a figure-of-eight fashion over the signal around 6 cm proximal to the anal verge. I then ran that stitch longitudinally more distally to the white line of [place]. I then tied that stitch down to cause a hemorrhoidopexy. I did that for all 6 locations.    I redid Doppler anoscopy. I Identified no additional signals. At completion of this, all hemorrhoids were reduced into the rectum.  There was no more prolapse. External anatomy looked normal.  I repeated anoscopy and examination.   Hemostasis was good.  Patient is being extubated go to recovery room.  I am about to discuss the patient's status to the family.

## 2014-12-18 NOTE — Discharge Instructions (Addendum)
ANORECTAL SURGERY: POST OP INSTRUCTIONS 1. Take your usually prescribed home medications unless otherwise directed. 2. DIET: During the first few hours after surgery sip on some liquids until you are able to urinate.  It is normal to not urinate for several hours after this surgery.  If you feel uncomfortable, please contact the office for instructions.  After you are able to urinate,you may eat, if you feel like it.  Follow a light bland diet the first 24 hours after arrival home, such as soup, liquids, crackers, etc.  Be sure to include lots of fluids daily (6-8 glasses).  Avoid fast food or heavy meals, as your are more likely to get nauseated.  Eat a low fat diet the next few days after surgery.  Limit caffeine intake to 1-2 servings a day. 3. PAIN CONTROL: a. Pain is best controlled by a usual combination of several different methods TOGETHER: i. Muscle relaxation 1.  Soak in a warm bath (or Sitz bath) three times a day and after bowel movements.  Continue to do this until all pain is resolved. 2. Take the muscle relaxer (Valium) every 6 hours for the first 2 days after surgery  ii. Over the counter pain medication iii. Prescription pain medication b. Most patients will experience some swelling and discomfort in the anus/rectal area and incisions.  Heat such as warm towels, sitz baths, warm baths, etc to help relax tight/sore spots and speed recovery.  Some people prefer to use ice, especially in the first couple days after surgery, as it may decrease the pain and swelling, or alternate between ice & heat.  Experiment to what works for you.  Swelling and bruising can take several weeks to resolve.  Pain can take even longer to completely resolve. c. It is helpful to take an over-the-counter pain medication regularly for the first few weeks.  Choose one of the following that works best for you: i. Naproxen (Aleve, etc)  Two 220mg  tabs twice a day ii. Ibuprofen (Advil, etc) Three 200mg  tabs four  times a day (every meal & bedtime) d. A  prescription for pain medication (such as percocet, oxycodone, hydrocodone, etc) should be given to you upon discharge.  Take your pain medication as prescribed.  i. If you are having problems/concerns with the prescription medicine (does not control pain, nausea, vomiting, rash, itching, etc), please call us [phone] to see if we need to switch you to a different pain medicine that will work better for you and/or control your side effect better. ii. If you need a refill on your pain medication, please contact your pharmacy.  They will contact our office to request authorization. Prescriptions will not be filled after 5 pm or on week-ends. iii. If you have trouble urinating, take one of the valium tabs and sit in a tub of warm water (you may even have to urinate in the water).  Limit your fluid intake until you can urinate.  If none of these methods are successful use the red catheter as instructed by the RN to empty your bladder as needed.    4. KEEP YOUR BOWELS REGULAR and AVOID CONSTIPATION a. The goal is one to two soft bowel movements a day.  You should at least have a bowel movement every other day. b. Avoid getting constipated.  Between the surgery and the pain medications, it is common to experience some constipation. This can be very painful after rectal surgery.  Increasing fluid intake and taking a fiber supplement (such  as Metamucil, Citrucel, FiberCon, etc) 1-2 times a day regularly will usually help prevent this problem from occurring.  A stool softener like colace is also recommended.  This can be purchased over the counter at your pharmacy.  You can take it up to 3 times a day.  If you do not have a bowel movement after 24 hrs since your surgery, take one does of milk of magnesia.  If you still haven't had a bowel movement 8-12 hours after that dose, take another dose.  If you don't have a bowel movement 48 hrs after surgery, purchase a Fleets  enema from the drug store and administer gently per package instructions.  If you still are having trouble with your bowel movements after that, please call the office for further instructions. c. If you develop diarrhea or have many loose bowel movements, simplify your diet to bland foods & liquids for a few days.  Stop any stool softeners and decrease your fiber supplement.  Switching to mild anti-diarrheal medications (Kayopectate, Pepto Bismol) can help.  If this worsens or does not improve, please call us.  5. Wound Care a. Remove your bandages before your first bowel movement or 8 hours after surgery.     b. Remove any wound packing material at this tim,e as well.  You do not need to repack the wound unless instructed otherwise.  Wear an absorbent pad or soft cotton gauze in your underwear to catch any drainage and help keep the area clean. You should change this every 2-3 hours while awake. c. Keep the area clean and dry.  Bathe / shower every day, especially after bowel movements.  Keep the area clean by showering / bathing over the incision / wound.   It is okay to soak an open wound to help wash it.  Wet wipes or showers / gentle washing after bowel movements is often less traumatic than regular toilet paper. d. Bonita Quin may have some styrofoam-like soft packing in the rectum which will come out with the first bowel movement.  e. You will often notice bleeding with bowel movements.  This should slow down by the end of the first week of surgery f. Expect some drainage.  This should slow down, too, by the end of the first week of surgery.  Wear an absorbent pad or soft cotton gauze in your underwear until the drainage stops. g. Do Not sit on a rubber or pillow ring.  This can make you symptoms worse.  You may sit on a soft pillow if needed.  6. ACTIVITIES as tolerated:   a. You may resume regular (light) daily activities beginning the next day--such as daily self-care, walking, climbing  stairs--gradually increasing activities as tolerated.  If you can walk 30 minutes without difficulty, it is safe to try more intense activity such as jogging, treadmill, bicycling, low-impact aerobics, swimming, etc. b. Save the most intensive and strenuous activity for last such as sit-ups, heavy lifting, contact sports, etc  Refrain from any heavy lifting or straining until you are off narcotics for pain control.   c. You may drive when you are no longer taking prescription pain medication, you can comfortably sit for long periods of time, and you can safely maneuver your car and apply brakes. d. Bonita Quin may have sexual intercourse when it is comfortable.  7. FOLLOW UP in our office a. Please call [facility] at [phone] to set up an appointment to see your surgeon in the office for a follow-up appointment  approximately 3-4 weeks after your surgery. b. Make sure that you call for this appointment the day you arrive home to insure a convenient appointment time. 8. IF YOU HAVE DISABILITY OR FAMILY LEAVE FORMS, BRING THEM TO THE OFFICE FOR PROCESSING.  DO NOT GIVE THEM TO YOUR DOCTOR.     WHEN TO CALL US [phone]: 1. Poor pain control 2. Reactions / problems with new medications (rash/itching, nausea, etc)  3. Fever over 101.5 F (38.5 C) 4. Inability to urinate 5. Nausea and/or vomiting 6. Worsening swelling or bruising 7. Continued bleeding from incision. 8. Increased pain, redness, or drainage from the incision  The clinic staff is available to answer your questions during regular business hours (8:30am-5pm).  Please dont hesitate to call and ask to speak to one of our nurses for clinical concerns.   A [facility] from [facility] is always on call at the hospitals   If you have a medical emergency, go to the nearest emergency room or call 911.    [facility] [address], [address], [place], [place]  [address] ? MAIN: [phone] ? TOLL FREE:  [phone] ? FAX [phone] [email or web address]   [facility]  HERNIA REPAIR POST OP INSTRUCTIONS  Always review your discharge instruction sheet given to you by the facility where your surgery was performed.  8. A  prescription for pain medication may be given to you upon discharge.  Take your pain medication as prescribed.  If narcotic pain medicine is not needed, then you may take acetaminophen (Tylenol) or ibuprofen (Advil) as needed.  9. Take your usually prescribed medications unless otherwise directed.  10. If you need a refill on your pain medication, please contact your pharmacy.  They will contact our office to request authorization. Prescriptions will not be filled after 5 pm daily or on weekends.  11. You should follow a light diet the first 24 hours after arrival home, such as soup and crackers or toast.  Be sure to include plenty of fluids daily.  Resume your normal diet the day after surgery.  12. Most patients will experience some swelling and bruising around the surgical site.  Ice packs and reclining will help.  Swelling and bruising can take several days to resolve.   13. It is common to experience some constipation if taking pain medication after surgery.  Increasing fluid intake and taking a stool softener (such as Colace) will usually help or prevent this problem from occurring.  A mild laxative (Milk of Magnesia or Miralax) should be taken according to package directions if there are no bowel movements after 48 hours.  14. Unless discharge instructions indicate otherwise, you may remove your bandages 24-48 hours after surgery, and you may shower at that time.  You will have steri-strips (small skin tapes) in place directly over the incision.  These strips should be left on the skin for 5-7 days.  Any sutures or staples will be removed at the office during your follow-up visit.  15. ACTIVITIES:  You may resume regular (light) daily  activities beginning the next day - such as daily self-care, walking, climbing stairs - gradually increasing activities as tolerated.  You may have sexual intercourse when it is comfortable.  Refrain from any heavy lifting or straining until approved by your doctor.  You may drive when you are no longer taking prescription pain medication, you can comfortably wear a seatbelt, and you can safely maneuver your car and apply  brakes.  16. You should see your doctor in the office for a follow-up appointment approximately 2-3 weeks after your surgery.  Make sure that you call for this appointment within a day or two after you arrive home to insure a convenient appointment time. 17.   WHEN TO CALL YOUR DOCTOR: 9. Fever greater than 101.0 10. Inability to urinate 11. Persistent nausea and/or vomiting 12. Extreme swelling or bruising 13. Continued bleeding from incision 14. Increased pain, redness, or drainage from the incision  The clinic staff is available to answer your questions during regular business hours.  Please dont hesitate to call and ask to speak to one of the nurses for clinical concerns.  If you have a medical emergency, go to the nearest emergency room or call 911.  A surgeon from [facility] is always on call for the hospital.   [facility] [address], [address], [place], [place]  [address]  [phone] ? [phone] ? FAX [phone]  [email or web address]  Post Anesthesia Home Care Instructions  Activity: Get plenty of rest for the remainder of the day. A responsible adult should stay with you for 24 hours following the procedure.  For the next 24 hours, DO NOT: -Drive a car -Advertising copywriter -Drink alcoholic beverages -Take any medication unless instructed by your physician -Make any legal decisions or sign important papers.  Meals: Start with liquid foods such as gelatin or soup. Progress to regular foods as  tolerated. Avoid greasy, spicy, heavy foods. If nausea and/or vomiting occur, drink only clear liquids until the nausea and/or vomiting subsides. Call your physician if vomiting continues.  Special Instructions/Symptoms: Your throat may feel dry or sore from the anesthesia or the breathing tube placed in your throat during surgery. If this causes discomfort, gargle with warm salt water. The discomfort should disappear within 24 hours.  If you had a scopolamine patch placed behind your ear for the management of post- operative nausea and/or vomiting:  1. The medication in the patch is effective for 72 hours, after which it should be removed.  Wrap patch in a tissue and discard in the trash. Wash hands thoroughly with soap and water. 2. You may remove the patch earlier than 72 hours if you experience unpleasant side effects which may include dry mouth, dizziness or visual disturbances. 3. Avoid touching the patch. Wash your hands with soap and water after contact with the patch.

## 2014-12-18 NOTE — Transfer of Care (Signed)
Immediate Anesthesia Transfer of Care Note  Patient: Leone BrandBrian E Walczyk  Procedure(s) Performed: Procedure(s): REPAIR LEFT INGUINAL HERNIA  (Left) INSERTION OF MESH (N/A) TRANSANAL HEMORRHOIDAL DEARTERIALIZATION  Patient Location: PACU  Anesthesia Type:General  Level of Consciousness: awake, alert , oriented and patient cooperative  Airway & Oxygen Therapy: Patient Spontanous Breathing and Patient connected to nasal cannula oxygen  Post-op Assessment: Report given to RN and Post -op Vital signs reviewed and stable  Post vital signs: Reviewed and stable  Last Vitals:  Filed Vitals:   12/18/14 0956  BP: 152/94  Pulse: 78  Temp: 36.7 C  Resp: 14    Complications: No apparent anesthesia complications

## 2014-12-18 NOTE — Interval H&P Note (Signed)
History and Physical Interval Note:  12/18/2014 11:36 AM  Leone BrandBrian E Fasnacht  has presented today for surgery, with the diagnosis of reducible left inguinal hernia.  The various methods of treatment have been discussed with the patient and family. After consideration of risks, benefits and other options for treatment, the patient has consented to  Repair of LIH with mesh as the surgical intervention .    The patient's history has been reviewed, patient examined, no change in status, stable for surgery.  I have reviewed the patient's chart and labs.  Questions were answered to the patient's satisfaction.    Velora Hecklerodd M. Saren Corkern, MD, [facility] Office: [phone]    Anant Agard MJudie Petit

## 2014-12-18 NOTE — Op Note (Signed)
Inguinal Hernia, Open, Procedure Note  Pre-operative Diagnosis:  Recurrent left inguinal hernia, reducible  Post-operative Diagnosis: same  Surgeon:  Velora Hecklerodd M. Brindy Higginbotham, MD, FACS  Anesthesia:  General  Preparation:  Chlora-prep  Estimated Blood Loss: Minimal  Complications:  none  Indications: Patient has also noted a bulge in the left groin. He had undergone a hernia repair again at approximately age 59. To his knowledge, mesh was not employed. Patient has now developed a recurrence. He has mild discomfort. He has had no signs or symptoms of obstruction.   Procedure Details  The patient was evaluated in the holding area. All of the patient's questions were answered and the proposed procedure was confirmed. The site of the procedure was properly marked. The patient was taken to the Operating Room, identified by name, and the procedure verified as inguinal hernia repair.  The patient was placed in the supine position and underwent induction of anesthesia. A "Time Out" was performed per routine. The lower abdomen and groin were prepped and draped in the usual aseptic fashion.  After ascertaining that an adequate level of anesthesia had been obtained, an incision was made in the groin with a #10 blade.  Dissection was carried through the subcutaneous tissues and hemostasis obtained with the electrocautery.  A Gelpi retractor was placed for exposure.  The external oblique fascia was incised in line with it's fibers and extended through the external inguinal ring.  The cord structures were dissected out of the inguinal canal and encircled with a Penrose drain.  The floor of the inguinal canal was dissected out.  There was moderate scar tissue from prior primary repair.  No mesh was identified  The cord was explored and there was a large indirect hernia sac.  This was dissected out up to the level of the internal ring and reduced.  The ring was closed laterally with interrupted 2-0 silk  sutures.  The floor of the inguinal canal was reconstructed with Ethicon Ultrapro mesh cut to the appropriate dimensions.  It was secured to the pubic tubercle with a 2-0 Novafil suture and along the inguinal ligament with a running 2-0 Novafil suture.  Mesh was split to accommodate the cord structures.  The superior margin of the mesh was secured to the transversalis and internal oblique musculature with interrupted 2-0 Novafil sutures.  The tails of the mesh were overlapped lateral to the cord structures and secured to the inguinal ligament with interrupted 2-0 Novafil sutures to recreate the internal inguinal ring.  Cord structures were returned to the inguinal canal.  Local anesthetic was infiltrated throughout the field.  External oblique fascia was closed with interrupted 3-0 Vicryl sutures.  Subcutaneous tissues were closed with interrupted 3-0 Vicryl sutures.  Skin was anesthetized with local anesthetic, and the skin edges were re-approximated with a running 4-0 Monocryl suture.  Wound was washed and dried and benzoin and steristrips were applied.  A gauze dressing was applied.  Instrument, sponge, and needle counts were correct prior to closure and at the conclusion of the case.  The patient tolerated the procedure well.  The patient was awakened from anesthesia and brought to the recovery room in stable condition.  Velora Hecklerodd M. Areg Bialas, MD, [facility] Office: [phone]

## 2014-12-18 NOTE — Anesthesia Procedure Notes (Signed)
Procedure Name: LMA Insertion Date/Time: 12/18/2014 11:34 AM Performed by: Tyrone NineSAUVE, Esabella Stockinger F Pre-anesthesia Checklist: Patient identified, Timeout performed, Emergency Drugs available, Suction available and Patient being monitored Patient Re-evaluated:Patient Re-evaluated prior to inductionOxygen Delivery Method: Circle system utilized Preoxygenation: Pre-oxygenation with 100% oxygen Intubation Type: IV induction Ventilation: Mask ventilation without difficulty LMA: LMA inserted LMA Size: 4.0 Number of attempts: 1 Airway Equipment and Method: Bite block Placement Confirmation: positive ETCO2 and breath sounds checked- equal and bilateral Tube secured with: Tape Dental Injury: Teeth and Oropharynx as per pre-operative assessment

## 2014-12-18 NOTE — H&P (View-Only) (Signed)
[name] 11/18/2014 11:59 AM Location: [facility] Patient #: [id number] DOB: 01/15/1956 Married / Language: English / Race: White Male  History of Present Illness Ross Levee MD; 11/18/2014 12:28 PM) Patient words: hems.  The patient is a 59 year old male who presents with hemorrhoids.   Patient previously been managed by Dr Gerrit Friends for perforated diverticular disease with colostomy. Patient has a long-standing history of hemorrhoid disease dating back 40 years. He had an open hemorrhoidectomy performed approximately 40 years ago. Over the past year he has noted progressive prolapse, discomfort following bowel movements, and mild bleeding on the toilet tissue. He presents today for evaluation. He is due for his colonoscopy.   Problem List/Past Medical Ross Levee, MD; 11/18/2014 12:28 PM) REDUCIBLE LEFT INGUINAL HERNIA (K40.90) PROLAPSED INTERNAL HEMORRHOIDS, GRADE 3 (B14.7)  Other Problems Ross Levee, MD; 11/18/2014 12:28 PM) Inguinal Hernia Hemorrhoids  Diagnostic Studies History Ross Levee, MD; 11/18/2014 12:28 PM) Colonoscopy 1-5 years ago  Allergies Lamar Laundry Bynum, CMA; 11/18/2014 11:59 AM) No Known Drug Allergies 11/17/2014  Medication History (Sonya Bynum, CMA; 11/18/2014 11:59 AM) Metoprolol Succinate ER (  Tablet ER 24HR, Oral) Active. Aspirin (  Tablet, Oral) Active. Multiple Vitamin (Oral) Active. Medications Reconciled  Social History Ross Levee, MD; 11/18/2014 12:28 PM) No drug use Caffeine use Coffee. Alcohol use Occasional alcohol use. Tobacco use Never smoker.  Family History Ross Levee, MD; 11/18/2014 12:28 PM) Breast Cancer Mother. Heart Disease Father. Malignant Neoplasm Of Pancreas Brother.     Review of Systems Ross Levee MD; 11/18/2014 12:28 PM) General Present- Night Sweats. Not Present- Appetite Loss, Chills, Fatigue, Fever, Weight Gain and Weight Loss. Skin Not Present- Change in  Wart/Mole, Dryness, Hives, Jaundice, New Lesions, Non-Healing Wounds, Rash and Ulcer. HEENT Not Present- Earache, Hearing Loss, Hoarseness, Nose Bleed, Oral Ulcers, Ringing in the Ears, Seasonal Allergies, Sinus Pain, Sore Throat, Visual Disturbances, Wears glasses/contact lenses and Yellow Eyes. Respiratory Not Present- Bloody sputum, Chronic Cough, Difficulty Breathing, Snoring and Wheezing. Breast Not Present- Breast Mass, Breast Pain, Nipple Discharge and Skin Changes. Cardiovascular Not Present- Chest Pain, Difficulty Breathing Lying Down, Leg Cramps, Palpitations, Rapid Heart Rate, Shortness of Breath and Swelling of Extremities. Gastrointestinal Present- Bloody Stool and Hemorrhoids. Not Present- Abdominal Pain, Bloating, Change in Bowel Habits, Chronic diarrhea, Constipation, Difficulty Swallowing, Excessive gas, Gets full quickly at meals, Indigestion, Nausea, Rectal Pain and Vomiting. Male Genitourinary Not Present- Blood in Urine, Change in Urinary Stream, Frequency, Impotence, Nocturia, Painful Urination, Urgency and Urine Leakage. Musculoskeletal Not Present- Back Pain, Joint Pain, Joint Stiffness, Muscle Pain, Muscle Weakness and Swelling of Extremities. Neurological Not Present- Decreased Memory, Fainting, Headaches, Numbness, Seizures, Tingling, Tremor, Trouble walking and Weakness. Psychiatric Not Present- Anxiety, Bipolar, Change in Sleep Pattern, Depression, Fearful and Frequent crying. Endocrine Not Present- Cold Intolerance, Excessive Hunger, Hair Changes, Heat Intolerance and New Diabetes. Hematology Not Present- Easy Bruising, Excessive bleeding, Gland problems, HIV and Persistent Infections.  Vitals (Sonya Bynum CMA; 11/18/2014 11:59 AM) 11/18/2014 11:59 AM Weight: 178 lb Height: 74in Body Surface Area: 2.07 m Body Mass Index: 22.85 kg/m  Temp.: 95F(Temporal)  Pulse: 73 (Regular)  BP: 128/76 (Sitting, Left Arm, Standard)      Physical Exam Ross Levee  MD; 11/18/2014 12:29 PM)  The physical exam findings are as follows: Note:General - appears comfortable, no distress; not diaphorectic  HEENT - normocephalic; sclerae clear, gaze conjugate; mucous membranes moist, dentition good; voice normal  Neck - symmetric on extension; no palpable anterior or posterior cervical adenopathy; no palpable masses  in the thyroid bed  Chest - clear bilaterally without rhonchi, rales, or wheeze  Cor - regular rhythm with normal rate; no significant murmur  Abd - soft without distension; well-healed midline surgical incision without evidence of incisional hernia; well-healed colostomy site left mid abdomen without evidence of hernia; no palpable masses; no tenderness  GU - normal male genitalia without mass or lesion; palpation in the left inguinal canal shows an obvious moderate-sized inguinal hernia which is soft and nontender; it is reducible; it spontaneously processed lapses with Valsalva  Rectal - external examination shows prolapsed internal hemorrhoid with superficial ulceration and friability; digital rectal exam shows normal tone without palpable mass; grade 3 internal hemorrhoid with prolapse and ulceration is noted (R ant)  Ext - non-tender without significant edema or lymphedema  Neuro - grossly intact; no tremor    Assessment & Plan [name](Ginny Loomer MD; 11/18/2014 12:26 PM)  PROLAPSED INTERNAL HEMORRHOIDS, GRADE 3 (K64.2) Impression: 59 year old male who presents to the office with grade 3 internal hemorrhoids and a recurrent left inguinal hernia. He was seen by my partner Dr. Ricky StabsJerkins yesterday and open repair of hernia was recommended. On exam today the patient has grade 3 internal hemorrhoids with significant inflammation. I have recommended THD. We discussed this in detail. We also discussed possibly doing the hemorrhoid surgery and the hernia surgery at the same time. The patient will think about this. In the meantime we will plan on  scheduling the hemorrhoid procedure. If he decides he wants to try to schedule them together he will call the office.

## 2014-12-18 NOTE — Anesthesia Preprocedure Evaluation (Addendum)
Anesthesia Evaluation  Patient identified by MRN, date of birth, ID band Patient awake    Reviewed: Allergy & Precautions, NPO status , Patient's Chart, lab work & pertinent test results  History of Anesthesia Complications Negative for: history of anesthetic complications  Airway Mallampati: II  TM Distance: >3 FB Neck ROM: Full    Dental no notable dental hx. (+) Dental Advisory Given, Teeth Intact   Pulmonary neg pulmonary ROS,    Pulmonary exam normal breath sounds clear to auscultation       Cardiovascular Exercise Tolerance: Good hypertension, Pt. on medications and Pt. on home beta blockers Normal cardiovascular exam+ Valvular Problems/Murmurs (hx of MV repair) MVP  Rhythm:Regular Rate:Normal  Reviewed last cardiology note   ------------------------------------------------------------ Transthoracic Echocardiography 04-09-12 Study Date: 04/09/2012 LV EF: 55% - 60%.   Neuro/Psych negative neurological ROS  negative psych ROS   GI/Hepatic negative GI ROS, Neg liver ROS,   Endo/Other  negative endocrine ROS  Renal/GU negative Renal ROS  negative genitourinary   Musculoskeletal negative musculoskeletal ROS (+)   Abdominal   Peds negative pediatric ROS (+)  Hematology negative hematology ROS (+)   Anesthesia Other Findings   Reproductive/Obstetrics negative OB ROS                          Anesthesia Physical Anesthesia Plan  ASA: III  Anesthesia Plan: General   Post-op Pain Management:    Induction: Intravenous  Airway Management Planned: LMA  Additional Equipment:   Intra-op Plan:   Post-operative Plan: Extubation in OR  Informed Consent: I have reviewed the patients History and Physical, chart, labs and discussed the procedure including the risks, benefits and alternatives for the proposed anesthesia with the patient or authorized representative who has indicated  his/her understanding and acceptance.   Dental advisory given  Plan Discussed with: CRNA and Anesthesiologist  Anesthesia Plan Comments:        Anesthesia Quick Evaluation

## 2014-12-18 NOTE — Anesthesia Postprocedure Evaluation (Signed)
Anesthesia Post Note  Patient: [name]  Procedure(s) Performed: Procedure(s) (LRB): REPAIR LEFT INGUINAL HERNIA  (Left) INSERTION OF MESH (N/A) TRANSANAL HEMORRHOIDAL DEARTERIALIZATION  Patient location during evaluation: PACU Anesthesia Type: General Level of consciousness: awake and alert Pain management: pain level controlled Vital Signs Assessment: post-procedure vital signs reviewed and stable Respiratory status: spontaneous breathing, nonlabored ventilation, respiratory function stable and patient connected to nasal cannula oxygen Cardiovascular status: blood pressure returned to baseline and stable Postop Assessment: no signs of nausea or vomiting Anesthetic complications: no    Last Vitals:  Filed Vitals:   12/18/14 1430 12/18/14 1445  BP: 152/104 153/103  Pulse: 68 68  Temp:    Resp: 15 17    Last Pain:  Filed Vitals:   12/18/14 1449  PainSc: 3                  Hurbert Duran JENNETTE

## 2014-12-18 NOTE — Interval H&P Note (Signed)
History and Physical Interval Note:  12/18/2014 10:47 AM  [name]  has presented today for surgery, with the diagnosis of reducible left inguinal hernia  The various methods of treatment have been discussed with the patient and family. After consideration of risks, benefits and other options for treatment, the patient has consented to  Procedure(s): REPAIR LEFT INGUINAL HERNIA  (Left) INSERTION OF MESH (N/A) TRANSANAL HEMORRHOIDAL DEARTERIALIZATION (N/A) as a surgical intervention .  The patient's history has been reviewed, patient examined, no change in status, stable for surgery.  I have reviewed the patient's chart and labs.  Questions were answered to the patient's satisfaction.  We have discussed the risks of surgery, which include bleeding, infection, pain and urinary retention.   Vanita PandaAlicia C Shakendra Griffeth, MD  Colorectal and General Surgery [facility]

## 2014-12-19 ENCOUNTER — Encounter (HOSPITAL_BASED_OUTPATIENT_CLINIC_OR_DEPARTMENT_OTHER): Payer: Self-pay | Admitting: Surgery

## 2015-02-02 ENCOUNTER — Other Ambulatory Visit: Payer: Self-pay | Admitting: Dermatology

## 2015-03-26 ENCOUNTER — Ambulatory Visit (INDEPENDENT_AMBULATORY_CARE_PROVIDER_SITE_OTHER): Payer: Managed Care, Other (non HMO) | Admitting: Cardiovascular Disease

## 2015-03-26 ENCOUNTER — Encounter: Payer: Self-pay | Admitting: Cardiovascular Disease

## 2015-03-26 VITALS — BP 150/98 | HR 68 | Ht 74.0 in | Wt 179.0 lb

## 2015-03-26 DIAGNOSIS — I10 Essential (primary) hypertension: Secondary | ICD-10-CM

## 2015-03-26 DIAGNOSIS — Z9889 Other specified postprocedural states: Secondary | ICD-10-CM

## 2015-03-26 MED ORDER — LOSARTAN POTASSIUM 50 MG PO TABS: 50.0000 mg | ORAL_TABLET | Freq: Every day | ORAL | Status: DC

## 2015-03-26 NOTE — Patient Instructions (Signed)
Medication Instructions:  1) START Losartan 50mg  once daily  Labwork: Your physician recommends that you return for lab work in: 3 weeks (BMET)   Testing/Procedures: None  Follow-Up: Your physician wants you to follow-up in: 1 year with Dr, Elease HashimotoNahser. You will receive a reminder letter in the mail two months in advance. If you don't receive a letter, please call our office to schedule the follow-up appointment.   Any Other Special Instructions Will Be Listed Below (If Applicable).     If you need a refill on your cardiac medications before your next appointment, please call your pharmacy.

## 2015-03-26 NOTE — Progress Notes (Signed)
Cardiology Office Note   Date:  03/26/2015   ID:  [name], DOB 05-24-1955, MRN [id number]  PCP:  No PCP Per Patient  Cardiologist:   Elease Hashimoto Deloris Ping, MD   Chief Complaint  Patient presents with  . Follow-up    mitral valve repair     1. 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 Hypertension  History of Present Illness:  March 29, 2012:  [name] is seen today after a 2 year absence. He has done well. He is working out regularly - 3 days a week. No Cp or dyspnea. No syncope.   March 20, 2014:   [name] is a 60 y.o. male who presents for follow up of his mitral valve replacement  He is doing great.   Some concerns about ED. No CP Takes his BP regularly   March 26, 2015:  Doing well.   BP is a bit high today .  Some extra anxiety.    By night , seems to be normal.  No CP, no dyspnea Colonoscopy last week -  Got a good report.       Past Medical History  Diagnosis Date  . Hypertension   . Heart murmur   . History of diverticulitis of colon     s/p  colon resection for perferated diverticulitis 09-08-2011  . S/P MVR (mitral valve repair)     cardiologist-  [name]  . History of bacterial endocarditis     2005--  poor dental hygiene   . Hemorrhoid   . Diverticulosis of colon   . History of mitral valve prolapse     long hx mvp  w/ subsequent bacterial endocarditis 2005 with MV repair   . Left inguinal hernia   . ED (erectile dysfunction)   . Congenital hearing loss   . Grade III hemorrhoids   . Wears glasses   . Wears hearing aid     bilateral    Past Surgical History  Procedure Laterality Date  . Hemorrhoid surgery  1985  . Laparotomy  09/08/2011    Procedure: EXPLORATORY LAPAROTOMY;  Surgeon: Velora Heckler, MD;  Location: [facility];  Service: General;  Laterality: N/A;  sigmoid colectomy  . Appendectomy  09/08/2011    Procedure: APPENDECTOMY;  Surgeon: Velora Heckler,  MD;  Location: [facility];  Service: General;  Laterality: N/A;  incidental  . Colostomy closure  01/03/2012    Procedure: COLOSTOMY CLOSURE;  Surgeon: Velora Heckler, MD;  Location: [facility] OR;  Service: General;  Laterality: N/A;  . Cardiac catheterization  10-01-2003  [name]    normal coronaries, mildly enlarged LV but well preserved LVSF  . Transthoracic echocardiogram  04-09-2012   dr  Melburn Popper    mild focal basal hyperthrophy of the septum,  ef 55-60%,  grade 1 diastolic dysfunction/  small gradient across MV consistent with repair (mean gradient 7mm Hg, valve area 2.39cm^2) with mild MR/  mild LAE/  trivial TR and PR  . Mitral valve annuloplasty  10-04-2003   dr gerhardt  . Inguinal hernia repair Left 12/18/2014    Procedure: REPAIR LEFT INGUINAL HERNIA ;  Surgeon: [name], MD;  Location: [facility];  Service: General;  Laterality: Left;  . Insertion of mesh N/A 12/18/2014    Procedure: INSERTION OF MESH;  Surgeon: [name], MD;  Location: [facility];  Service: General;  Laterality: N/A;  .  Transanal hemorrhoidal dearterialization  12/18/2014    Procedure: TRANSANAL HEMORRHOIDAL DEARTERIALIZATION;  Surgeon: [name], MD;  [facility];  Service: General;;     Current Outpatient Prescriptions  Medication Sig Dispense Refill  . aspirin 81 MG tablet Take 81 mg by mouth daily.      . diazepam (VALIUM) 5 MG tablet Take 1 tablet (5 mg total) by mouth every 6 (six) hours as needed (inability to urinate). 20 tablet 0  . metoprolol succinate (TOPROL-XL) 25 MG 24 hr tablet Take 3 tablets (75 mg total) by mouth every morning. Take with or immediately following a meal. 270 tablet 3  . Multiple Vitamins-Minerals (MULTIVITAMIN WITH MINERALS) tablet Take 1 tablet by mouth daily.    . Omega-3 Fatty Acids (OMEGA-3 FISH OIL) 1200 MG CAPS Take 1 capsule by mouth at bedtime.    [name]. oxyCODONE (OXY IR/ROXICODONE) 5 MG immediate release tablet Take 1-2  tablets (5-10 mg total) by mouth every 4 (four) hours as needed. 50 tablet 0   No current facility-administered medications for this visit.    Allergies:   Review of patient's allergies indicates no known allergies.    Social History:  The patient  reports that he has never smoked. He has never used smokeless tobacco. He reports that he drinks about 7.0 oz of alcohol per week. He reports that he does not use illicit drugs.   Family History:  The patient's family history includes Cancer in his brother.    ROS:  Please see the history of present illness.    Review of Systems: Constitutional:  denies fever, chills, diaphoresis, appetite change and fatigue.  HEENT: denies photophobia, eye pain, redness, hearing loss, ear pain, congestion, sore throat, rhinorrhea, sneezing, neck pain, neck stiffness and tinnitus.  Respiratory: denies SOB, DOE, cough, chest tightness, and wheezing.  Cardiovascular: denies chest pain, palpitations and leg swelling.  Gastrointestinal: denies nausea, vomiting, abdominal pain, diarrhea, constipation, blood in stool.  Genitourinary: denies dysuria, urgency, frequency, hematuria, flank pain and difficulty urinating.  Musculoskeletal: denies  myalgias, back pain, joint swelling, arthralgias and gait problem.   Skin: denies pallor, rash and wound.  Neurological: denies dizziness, seizures, syncope, weakness, light-headedness, numbness and headaches.   Hematological: denies adenopathy, easy bruising, personal or family bleeding history.  Psychiatric/ Behavioral: denies suicidal ideation, mood changes, confusion, nervousness, sleep disturbance and agitation.       All other systems are reviewed and negative.    PHYSICAL EXAM: VS:  BP 154/100 mmHg  Pulse 68  Ht 6\' 2"  (1.88 m)  Wt 179 lb (81.194 kg)  BMI 22.97 kg/m2 , BMI Body mass index is 22.97 kg/(m^2). GEN: Well nourished, well developed, in no acute distress HEENT: normal Neck: no JVD, carotid bruits,  or masses Cardiac: RRR; no murmurs, rubs, or gallops,no edema  Respiratory:  clear to auscultation bilaterally, normal work of breathing GI: soft, nontender, nondistended, + BS MS: no deformity or atrophy Skin: warm and dry, no rash Neuro:  Strength and sensation are intact Psych: normal   EKG:  EKG is not ordered today.   Recent Labs: 12/18/2014: Hemoglobin 14.6    Lipid Panel    Component Value Date/Time   CHOL 104 09/12/2011 0610   TRIG 123 09/12/2011 0610      Wt Readings from Last 3 Encounters:  03/26/15 179 lb (81.194 kg)  12/18/14 174 lb (78.926 kg)  03/20/14 177 lb (80.287 kg)      Other studies Reviewed: Additional studies/ records that were  reviewed today include: . Review of the above records demonstrates:    ASSESSMENT AND PLAN:  1. 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 He's not having any recurrent symptoms of endocarditis.   He is well aware of the need to take SB prophylaxis prior to having any work done or other invasive procedures.  2. Hypertension - blood pressures Have been slightly elevated. We will add losartan 50 mg at night. We'll check a basic medical profile in 3 weeks. I'll see him in one year. He was sent in blood pressure readings in the next 3-4 weeks.  3. ED - will try Viagra 20 mg as needed.    I'll see him again in one year.  Current medicines are reviewed at length with the patient today.  The patient does not have concerns regarding medicines.  The following changes have been made:  See above   Signed, Nahser, Deloris Ping, MD  03/26/2015 2:46 PM    [facility] [address] [place], [place], [place]  [address] Phone: [phone]; Fax: [phone]

## 2015-04-08 ENCOUNTER — Other Ambulatory Visit: Payer: Self-pay | Admitting: *Deleted

## 2015-04-08 MED ORDER — METOPROLOL SUCCINATE ER 25 MG PO TB24: 75.0000 mg | ORAL_TABLET | Freq: Every morning | ORAL | Status: DC

## 2015-04-16 ENCOUNTER — Other Ambulatory Visit (INDEPENDENT_AMBULATORY_CARE_PROVIDER_SITE_OTHER): Payer: Managed Care, Other (non HMO) | Admitting: *Deleted

## 2015-04-16 DIAGNOSIS — I10 Essential (primary) hypertension: Secondary | ICD-10-CM | POA: Diagnosis not present

## 2015-04-16 LAB — BASIC METABOLIC PANEL
BUN: 21 mg/dL (ref 7–25)
CHLORIDE: 105 mmol/L (ref 98–110)
CO2: 29 mmol/L (ref 20–31)
CREATININE: 1.28 mg/dL — AB (ref 0.70–1.25)
Calcium: 9.6 mg/dL (ref 8.6–10.3)
Glucose, Bld: 69 mg/dL (ref 65–99)
POTASSIUM: 4.6 mmol/L (ref 3.5–5.3)
SODIUM: 139 mmol/L (ref 135–146)

## 2015-08-25 ENCOUNTER — Other Ambulatory Visit: Payer: Self-pay | Admitting: Cardiovascular Disease

## 2016-03-18 ENCOUNTER — Encounter: Payer: Self-pay | Admitting: Cardiovascular Disease

## 2016-03-24 ENCOUNTER — Other Ambulatory Visit: Payer: Self-pay | Admitting: Cardiovascular Disease

## 2016-03-27 NOTE — Progress Notes (Deleted)
Cardiology Office Note   Date:  03/27/2016   ID:  [name], DOB 20-Nov-1955, MRN [id number]  PCP:  No PCP Per Patient  Cardiologist:   Kristeen MissPhilip Tafari Humiston, MD   No chief complaint on file.    1. 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 Hypertension  History of Present Illness:  March 29, 2012:  [name] is seen today after a 2 year absence. He has done well. He is working out regularly - 3 days a week. No Cp or dyspnea. No syncope.   March 20, 2014:   [name] is a 61 y.o. male who presents for follow up of his mitral valve replacement  He is doing great.   Some concerns about ED. No CP Takes his BP regularly   March 26, 2015:  Doing well.   BP is a bit high today .  Some extra anxiety.    By night , seems to be normal.  No CP, no dyspnea Colonoscopy last week -  Got a good report.     March 28, 2016:  [name] is seen today for follow up visit of his MVP and MV  endocarditis  - he is s/p MV repair Tyrone Sage([name]) in 2005    Past Medical History:  Diagnosis Date  . Congenital hearing loss   . Diverticulosis of colon   . ED (erectile dysfunction)   . Grade III hemorrhoids   . Heart murmur   . Hemorrhoid   . History of bacterial endocarditis    2005--  poor dental hygiene   . History of diverticulitis of colon    s/p  colon resection for perferated diverticulitis 09-08-2011  . History of mitral valve prolapse    long hx mvp  w/ subsequent bacterial endocarditis 2005 with MV repair   . Hypertension   . Left inguinal hernia   . S/P MVR (mitral valve repair)    cardiologist-  dr Melburn Poppernasher  . Wears glasses   . Wears hearing aid    bilateral    Past Surgical History:  Procedure Laterality Date  . APPENDECTOMY  09/08/2011   Procedure: APPENDECTOMY;  Surgeon: Velora Hecklerodd M Gerkin, MD;  Location: [facility];  Service: General;  Laterality: N/A;  incidental  . CARDIAC CATHETERIZATION  10-01-2003  dr  Melburn Poppernasher   normal coronaries, mildly enlarged LV but well preserved LVSF  . COLOSTOMY CLOSURE  01/03/2012   Procedure: COLOSTOMY CLOSURE;  Surgeon: Velora Hecklerodd M Gerkin, MD;  [facility];  Service: General;  Laterality: N/A;  . HEMORRHOID SURGERY  1985  . INGUINAL HERNIA REPAIR Left 12/18/2014   Procedure: REPAIR LEFT INGUINAL HERNIA ;  Surgeon: Darnell Levelodd Gerkin, MD;  [facility];  Service: General;  Laterality: Left;  . INSERTION OF MESH N/A 12/18/2014   Procedure: INSERTION OF MESH;  Surgeon: Darnell Levelodd Gerkin, MD;  [facility] [place];  Service: General;  Laterality: N/A;  . LAPAROTOMY  09/08/2011   Procedure: EXPLORATORY LAPAROTOMY;  Surgeon: Velora Hecklerodd M Gerkin, MD;  Location: [facility];  Service: General;  Laterality: N/A;  sigmoid colectomy  . MITRAL VALVE ANNULOPLASTY  10-04-2003   dr gerhardt  . TRANSANAL HEMORRHOIDAL DEARTERIALIZATION  12/18/2014   Procedure: TRANSANAL HEMORRHOIDAL DEARTERIALIZATION;  Surgeon: [name], MD;  [facility] [place];  Service: General;;  . TRANSTHORACIC ECHOCARDIOGRAM  04-09-2012   dr  Melburn Poppernasher   mild focal basal hyperthrophy of the septum,  ef  55-60%,  grade 1 diastolic dysfunction/  small gradient across MV consistent with repair (mean gradient 7mm Hg, valve area 2.39cm^2) with mild MR/  mild LAE/  trivial TR and PR     Current Outpatient Prescriptions  Medication Sig Dispense Refill  . aspirin 81 MG tablet Take 81 mg by mouth daily.      . diazepam (VALIUM) 5 MG tablet Take 1 tablet (5 mg total) by mouth every 6 (six) hours as needed (inability to urinate). 20 tablet 0  . losartan (COZAAR) 50 MG tablet TAKE 1 TABLET (50 MG TOTAL) BY MOUTH DAILY. 90 tablet 0  . metoprolol succinate (TOPROL-XL) 25 MG 24 hr tablet Take 3 tablets (75 mg total) by mouth every morning. Take with or immediately following a meal. 270 tablet 3  . Multiple Vitamins-Minerals (MULTIVITAMIN WITH MINERALS) tablet Take 1 tablet by mouth daily.    .  Omega-3 Fatty Acids (OMEGA-3 FISH OIL) 1200 MG CAPS Take 1 capsule by mouth at bedtime.    [name] oxyCODONE (OXY IR/ROXICODONE) 5 MG immediate release tablet Take 1-2 tablets (5-10 mg total) by mouth every 4 (four) hours as needed. 50 tablet 0  . sildenafil (REVATIO) 20 MG tablet TAKE 1-5 TABLETS BY MOUTH ONCE DAILY (ONLY IF NEEDED) 260 tablet 0   No current facility-administered medications for this visit.     Allergies:   Patient has no known allergies.    Social History:  The patient  reports that he has never smoked. He has never used smokeless tobacco. He reports that he drinks about 7.0 oz of alcohol per week . He reports that he does not use drugs.   Family History:  The patient's family history includes Cancer in his brother.    ROS:  Please see the history of present illness.    Review of Systems: Constitutional:  denies fever, chills, diaphoresis, appetite change and fatigue.  HEENT: denies photophobia, eye pain, redness, hearing loss, ear pain, congestion, sore throat, rhinorrhea, sneezing, neck pain, neck stiffness and tinnitus.  Respiratory: denies SOB, DOE, cough, chest tightness, and wheezing.  Cardiovascular: denies chest pain, palpitations and leg swelling.  Gastrointestinal: denies nausea, vomiting, abdominal pain, diarrhea, constipation, blood in stool.  Genitourinary: denies dysuria, urgency, frequency, hematuria, flank pain and difficulty urinating.  Musculoskeletal: denies  myalgias, back pain, joint swelling, arthralgias and gait problem.   Skin: denies pallor, rash and wound.  Neurological: denies dizziness, seizures, syncope, weakness, light-headedness, numbness and headaches.   Hematological: denies adenopathy, easy bruising, personal or family bleeding history.  Psychiatric/ Behavioral: denies suicidal ideation, mood changes, confusion, nervousness, sleep disturbance and agitation.       All other systems are reviewed and negative.    PHYSICAL EXAM: VS:   There were no vitals taken for this visit. , BMI There is no height or weight on file to calculate BMI. GEN: Well nourished, well developed, in no acute distress  HEENT: normal  Neck: no JVD, carotid bruits, or masses Cardiac: RRR; no murmurs, rubs, or gallops,no edema  Respiratory:  clear to auscultation bilaterally, normal work of breathing GI: soft, nontender, nondistended, + BS MS: no deformity or atrophy  Skin: warm and dry, no rash Neuro:  Strength and sensation are intact Psych: normal   EKG:  EKG is not ordered today.   Recent Labs: 04/16/2015: BUN 21; Creat 1.28; Potassium 4.6; Sodium 139    Lipid Panel    Component Value Date/Time   CHOL 104 09/12/2011 0610   TRIG 123 09/12/2011  1610      Wt Readings from Last 3 Encounters:  03/26/15 179 lb (81.2 kg)  12/18/14 174 lb (78.9 kg)  03/20/14 177 lb (80.3 kg)      Other studies Reviewed: Additional studies/ records that were reviewed today include: . Review of the above records demonstrates:    ASSESSMENT AND PLAN:  1. 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 He's not having any recurrent symptoms of endocarditis.   He is well aware of the need to take SB prophylaxis prior to having any work done or other invasive procedures.  2. Hypertension - blood pressures Have been slightly elevated. We will add losartan 50 mg at night. We'll check a basic medical profile in 3 weeks. I'll see him in one year. He was sent in blood pressure readings in the next 3-4 weeks.  3. ED - will try Viagra 20 mg as needed.    I'll see him again in one year.  Current medicines are reviewed at length with the patient today.  The patient does not have concerns regarding medicines.  The following changes have been made:  See above   Signed, Kristeen Miss, MD  03/27/2016 6:52 PM    [facility] [address] [place],  [place], [place]  [address] Phone: [phone]; Fax: [phone]

## 2016-03-28 ENCOUNTER — Ambulatory Visit: Payer: 59 | Admitting: Cardiovascular Disease

## 2016-03-29 ENCOUNTER — Encounter: Payer: Self-pay | Admitting: Allergy and Immunology

## 2016-03-29 ENCOUNTER — Ambulatory Visit (INDEPENDENT_AMBULATORY_CARE_PROVIDER_SITE_OTHER): Payer: Managed Care, Other (non HMO) | Admitting: Allergy and Immunology

## 2016-03-29 VITALS — BP 144/90 | HR 80 | Temp 97.5°F | Resp 18 | Ht 72.0 in | Wt 182.2 lb

## 2016-03-29 DIAGNOSIS — K219 Gastro-esophageal reflux disease without esophagitis: Secondary | ICD-10-CM | POA: Diagnosis not present

## 2016-03-29 DIAGNOSIS — J3089 Other allergic rhinitis: Secondary | ICD-10-CM | POA: Diagnosis not present

## 2016-03-29 MED ORDER — MONTELUKAST SODIUM 10 MG PO TABS: ORAL_TABLET | ORAL | 5 refills | Status: DC

## 2016-03-29 MED ORDER — RANITIDINE HCL 300 MG PO TABS: ORAL_TABLET | ORAL | 5 refills | Status: DC

## 2016-03-29 MED ORDER — DEXLANSOPRAZOLE 60 MG PO CPDR: DELAYED_RELEASE_CAPSULE | ORAL | 5 refills | Status: DC

## 2016-03-29 NOTE — Progress Notes (Signed)
NEW PATIENT NOTE  Referring Provider: No ref. provider found Primary Provider: No PCP Per Patient Date of office visit: 03/29/2016    Subjective:   Chief Complaint:  [name] (DOB: 1955-03-08) is a 61 y.o. male who presents to the clinic on 03/29/2016 with a chief complaint of Nasal Congestion (x several months) and Hoarse (x several months) .  HPI: [name] presents this clinic in evaluation of persistent respiratory tract symptoms that have developed over the course of the past year. He has developed issues with nasal congestion and lots of postnasal drip. When he wakes up in the morning his nose is congested. He has no associated purulent nasal discharge or anosmia and there is very little issues with sneezing. When he wakes up in the morning he does have lots of drainage in his throat. He's been getting raspy voice in the morning. He develops voice fatigue as the day goes on. He's also had loss of throat clearing and inability to clear out his throat but no swallowing problems. He has seen Dr. Haroldine Lawsrossley and has been treated with steroids and several different antibiotics which has not resulted in good control of his symptoms. Apparently he has had a mirror exam of his laryngeal area which was normal. It does not sound as though he had rhinoscopy. In addition, he has had increased snoring over the course of the past 6-9 months.  There is no obvious provoking factor giving rise to this issue. There has not been a significant environmental change. There is no associated systemic or constitutional symptoms.  Past Medical History:  Diagnosis Date  . Congenital hearing loss   . Diverticulosis of colon   . ED (erectile dysfunction)   . Grade III hemorrhoids   . Heart murmur   . Hemorrhoid   . History of bacterial endocarditis    2005--  poor dental hygiene   . History of diverticulitis of colon    s/p  colon resection for perferated diverticulitis 09-08-2011  . History of mitral  valve prolapse    long hx mvp  w/ subsequent bacterial endocarditis 2005 with MV repair   . Hypertension   . Left inguinal hernia   . S/P MVR (mitral valve repair)    cardiologist-  dr Melburn Poppernasher  . Wears glasses   . Wears hearing aid    bilateral    Past Surgical History:  Procedure Laterality Date  . APPENDECTOMY  09/08/2011   Procedure: APPENDECTOMY;  Surgeon: Velora Hecklerodd M Gerkin, MD;  Location: [facility];  Service: General;  Laterality: N/A;  incidental  . CARDIAC CATHETERIZATION  10-01-2003  dr Melburn Poppernasher   normal coronaries, mildly enlarged LV but well preserved LVSF  . COLOSTOMY CLOSURE  01/03/2012   Procedure: COLOSTOMY CLOSURE;  Surgeon: Velora Hecklerodd M Gerkin, MD;  [facility];  Service: General;  Laterality: N/A;  . HEMORRHOID SURGERY  1985  . INGUINAL HERNIA REPAIR Left 12/18/2014   Procedure: REPAIR LEFT INGUINAL HERNIA ;  Surgeon: Darnell Levelodd Gerkin, MD;  [facility] [place];  Service: General;  Laterality: Left;  . INSERTION OF MESH N/A 12/18/2014   Procedure: INSERTION OF MESH;  Surgeon: Darnell Levelodd Gerkin, MD;  [facility] [place];  Service: General;  Laterality: N/A;  . LAPAROTOMY  09/08/2011   Procedure: EXPLORATORY LAPAROTOMY;  Surgeon: Velora Hecklerodd M Gerkin, MD;  Location: [facility];  Service: General;  Laterality: N/A;  sigmoid colectomy  . MITRAL VALVE ANNULOPLASTY  10-04-2003   dr gerhardt  .  TRANSANAL HEMORRHOIDAL DEARTERIALIZATION  12/18/2014   Procedure: TRANSANAL HEMORRHOIDAL DEARTERIALIZATION;  Surgeon: Romie Levee, MD;  Location: [facility];  Service: General;;  . TRANSTHORACIC ECHOCARDIOGRAM  04-09-2012   dr  Melburn Popper   mild focal basal hyperthrophy of the septum,  ef 55-60%,  grade 1 diastolic dysfunction/  small gradient across MV consistent with repair (mean gradient 7mm Hg, valve area 2.39cm^2) with mild MR/  mild LAE/  trivial TR and PR    Allergies as of 03/29/2016   No Known Allergies     Medication List      aspirin 81 MG tablet Take 81 mg  by mouth daily.   losartan 50 MG tablet Commonly known as:  COZAAR TAKE 1 TABLET (50 MG TOTAL) BY MOUTH DAILY.   metoprolol succinate 25 MG 24 hr tablet Commonly known as:  TOPROL-XL Take 3 tablets (75 mg total) by mouth every morning. Take with or immediately following a meal.   multivitamin with minerals tablet Take 1 tablet by mouth daily.   Omega-3 Fish Oil 1200 MG Caps Take 1 capsule by mouth at bedtime.       Review of systems negative except as noted in HPI / PMHx or noted below:  Review of Systems  Constitutional: Negative.   HENT: Negative.   Eyes: Negative.   Respiratory: Negative.   Cardiovascular: Negative.   Gastrointestinal: Negative.   Genitourinary: Negative.   Musculoskeletal: Negative.   Skin: Negative.   Neurological: Negative.   Endo/Heme/Allergies: Negative.   Psychiatric/Behavioral: Negative.     Family History  Problem Relation Age of Onset  . Cancer Brother     pancreatic    Social History   Social History  . Marital status: Married    Spouse name: N/A  . Number of children: N/A  . Years of education: N/A   Occupational History  . Not on file.   Social History Main Topics  . Smoking status: Never Smoker  . Smokeless tobacco: Never Used  . Alcohol use 7.0 oz/week    14 Standard drinks or equivalent per week     Comment: occasional  . Drug use: No  . Sexual activity: Not on file   Other Topics Concern  . Not on file   Social History Narrative  . No narrative on file    Environmental and Social history  Lives in a house with a dry environment, 2 dogs located inside the household, limited in the bedroom floor, plastic on both the bed and the pillow, and no smoking ongoing with inside the house. He is a Engineer, civil (consulting).  Objective:   Vitals:   03/29/16 1425  BP: (!) 144/90  Pulse: 80  Resp: 18  Temp: 97.5 F (36.4 C)   Height: 6' (182.9 cm) Weight: 182 lb 3.2 oz (82.6 kg)  Physical Exam  Constitutional: He  is well-developed, well-nourished, and in no distress.  Constant throat clearing  HENT:  Head: Normocephalic. Head is without right periorbital erythema and without left periorbital erythema.  Right Ear: Tympanic membrane, external ear and ear canal normal.  Left Ear: Tympanic membrane, external ear and ear canal normal.  Nose: Mucosal edema present. No rhinorrhea.  Mouth/Throat: Oropharynx is clear and moist and mucous membranes are normal. No oropharyngeal exudate.  Eyes: Conjunctivae and lids are normal. Pupils are equal, round, and reactive to light.  Neck: Trachea normal. No tracheal deviation present. No thyromegaly present.  Cardiovascular: Normal rate, regular rhythm, S1 normal, S2 normal and normal  heart sounds.   No murmur heard. Pulmonary/Chest: Effort normal. No stridor. No tachypnea. No respiratory distress. He has no wheezes. He has no rales. He exhibits no tenderness.  Abdominal: Soft. He exhibits no distension and no mass. There is no hepatosplenomegaly. There is no tenderness. There is no rebound and no guarding.  Musculoskeletal: He exhibits no edema or tenderness.  Lymphadenopathy:       Head (right side): No tonsillar adenopathy present.       Head (left side): No tonsillar adenopathy present.    He has no cervical adenopathy.    He has no axillary adenopathy.  Neurological: He is alert. Gait normal.  Skin: No rash noted. He is not diaphoretic. No erythema. No pallor. Nails show no clubbing.  Psychiatric: Mood and affect normal.    Diagnostics: Allergy skin tests were performed. He demonstrated slight hypersensitivity against mold.  Assessment and Plan:    1. Other allergic rhinitis   2. LPRD (laryngopharyngeal reflux disease)     1. Allergen avoidance measures  2. Treat and prevent inflammation:   A. OTC Rhinocort/Nasacort one spray each nostril one time per day  B. montelukast 10 mg one tablet one time per day    3. Treat and prevent reflux:   A.  consolidate caffeine consumption  B. Dexilant 60mg  one tablet in a.m.  C. ranitidine 300 mg one tablet in PM  4. Obtain limited sinus CT scan  5. . Return to clinic in 3 weeks or earlier if problem  I think we are obligated to rule out the possibility of chronic sinusitis given the fact that Dagen continues to have upper respiratory tract symptoms even after utilizing systemic steroids and several antibiotics. As well, his history is very consistent with LPR and I will treat him empirically for this condition while we await the results of his CT scan. He will use anti-inflammatory agents for his respiratory tract as stated above. I'll see him back in this clinic in 3 weeks or earlier if there is a problem.  Laurette Schimke, MD Allergy / Immunology [facility]

## 2016-03-29 NOTE — Patient Instructions (Addendum)
  1. Allergen avoidance measures  2. Treat and prevent inflammation:   A. OTC Rhinocort/Nasacort one spray each nostril one time per day  B. montelukast 10 mg one tablet one time per day    3. Treat and prevent reflux:   A. consolidate caffeine consumption  B. Dexilant 60mg  one tablet in a.m.  C. ranitidine 300 mg one tablet in PM  4. Obtain limited sinus CT scan  5. . Return to clinic in 3 weeks or earlier if problem

## 2016-04-01 ENCOUNTER — Other Ambulatory Visit: Payer: Self-pay | Admitting: Allergy and Immunology

## 2016-04-01 DIAGNOSIS — J329 Chronic sinusitis, unspecified: Secondary | ICD-10-CM

## 2016-04-05 ENCOUNTER — Telehealth: Payer: Self-pay | Admitting: Allergy and Immunology

## 2016-04-05 NOTE — Telephone Encounter (Signed)
Pt called to talk with a nurse about the Dexilant. [phone].

## 2016-04-05 NOTE — Telephone Encounter (Signed)
Patient states that rx from [facility] is 270 dollars. I have placed a copay card and sample up front for him.

## 2016-04-05 NOTE — Telephone Encounter (Signed)
Pt needs to talk with the nurse about the rx he pickup earlier. [phone].

## 2016-04-05 NOTE — Telephone Encounter (Signed)
Patient is still getting 55 dollars from [facility] on his Dexilant. He is going to call [facility] and [place] [facility] and see what they say as far as price. He also wants to know if the ct scan is 100% necessary. It is going to cost him 1100.00 out of pocket.

## 2016-04-05 NOTE — Telephone Encounter (Signed)
He is returning your call

## 2016-04-05 NOTE — Telephone Encounter (Signed)
Please inform patient that he can wait on a CT scan to see how he does with medical treatment that was prescribed during his visit but if he still continues to have persistent respiratory tract symptoms then he probably needs to obtain that scan.

## 2016-04-06 MED ORDER — DEXLANSOPRAZOLE 60 MG PO CPDR: DELAYED_RELEASE_CAPSULE | ORAL | 5 refills | Status: DC

## 2016-04-06 NOTE — Telephone Encounter (Signed)
Patient informed of CT scan. He wants prescription for dexilant sent to [facility] on [address]. I will get that sent shortly.

## 2016-04-07 ENCOUNTER — Ambulatory Visit (HOSPITAL_COMMUNITY): Payer: 59

## 2016-04-12 ENCOUNTER — Encounter: Payer: Self-pay | Admitting: Cardiovascular Disease

## 2016-04-12 ENCOUNTER — Ambulatory Visit (INDEPENDENT_AMBULATORY_CARE_PROVIDER_SITE_OTHER): Payer: 59 | Admitting: Cardiovascular Disease

## 2016-04-12 VITALS — BP 142/100 | HR 77 | Ht 72.0 in | Wt 176.8 lb

## 2016-04-12 DIAGNOSIS — Z9889 Other specified postprocedural states: Secondary | ICD-10-CM

## 2016-04-12 DIAGNOSIS — I10 Essential (primary) hypertension: Secondary | ICD-10-CM | POA: Diagnosis not present

## 2016-04-12 MED ORDER — HYDROCHLOROTHIAZIDE 25 MG PO TABS: 25.0000 mg | ORAL_TABLET | Freq: Every day | ORAL | 11 refills | Status: DC

## 2016-04-12 MED ORDER — POTASSIUM CHLORIDE ER 10 MEQ PO TBCR: 10.0000 meq | EXTENDED_RELEASE_TABLET | Freq: Every day | ORAL | 11 refills | Status: DC

## 2016-04-12 NOTE — Progress Notes (Signed)
Cardiology Office Note   Date:  04/12/2016   ID:  [name], DOB [birth year], MRN [id number]  PCP:  No PCP Per Patient  Cardiologist:   Ross Miss, MD   Chief Complaint  Patient presents with  . Follow-up    MV repair, HTN     1. 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 Hypertension  History of Present Illness:  March 29, 2012:  [name] is seen today after a 2 year absence. He has done well. He is working out regularly - 3 days a week. No Cp or dyspnea. No syncope.   March 20, 2014:   [name] is a 61 y.o. male who presents for follow up of his mitral valve replacement  He is doing great.   Some concerns about ED. No CP Takes his BP regularly   March 26, 2015:  Doing well.   BP is a bit high today .  Some extra anxiety.    By night , seems to be normal.  No CP, no dyspnea Colonoscopy last week -  Got a good report.     April 12, 2016:  Doing well.  No CP or dyspnea. BP is still elevated. Is having a hoarse, raspy voice.   Saw ENT.   Tried  steroid,  Abx Had allergy testing - not allergic to anything  Allergy thought that he may have had GERD , was started on Dexilant, singulair and Zantac.  BP at home is normal .   Works out 3 days a week - does cardio exercise   Past Medical History:  Diagnosis Date  . Congenital hearing loss   . Diverticulosis of colon   . ED (erectile dysfunction)   . Grade III hemorrhoids   . Heart murmur   . Hemorrhoid   . History of bacterial endocarditis    2005--  poor dental hygiene   . History of diverticulitis of colon    s/p  colon resection for perferated diverticulitis 09-08-2011  . History of mitral valve prolapse    long hx mvp  w/ subsequent bacterial endocarditis 2005 with MV repair   . Hypertension   . Left inguinal hernia   . S/P MVR (mitral valve repair)    cardiologist-  dr [name]  . Wears glasses   . Wears hearing aid      bilateral    Past Surgical History:  Procedure Laterality Date  . APPENDECTOMY  09/08/2011   Procedure: APPENDECTOMY;  Surgeon: Ross Heckler, MD;  Location: [facility];  Service: General;  Laterality: N/A;  incidental  . CARDIAC CATHETERIZATION  10-01-2003  dr [name]   normal coronaries, mildly enlarged LV but well preserved LVSF  . COLOSTOMY CLOSURE  01/03/2012   Procedure: COLOSTOMY CLOSURE;  Surgeon: Ross Heckler, MD;  [facility];  Service: General;  Laterality: N/A;  . HEMORRHOID SURGERY  1985  . INGUINAL HERNIA REPAIR Left 12/18/2014   Procedure: REPAIR LEFT INGUINAL HERNIA ;  Surgeon: Ross Level, MD;  Location: [facility];  Service: General;  Laterality: Left;  . INSERTION OF MESH N/A 12/18/2014   Procedure: INSERTION OF MESH;  Surgeon: Ross Level, MD;  Location: [facility];  Service: General;  Laterality: N/A;  . LAPAROTOMY  09/08/2011   Procedure: EXPLORATORY LAPAROTOMY;  Surgeon: Ross Heckler, MD;  Location: [facility];  Service: General;  Laterality: N/A;  sigmoid colectomy  .  MITRAL VALVE ANNULOPLASTY  10-04-2003   dr [name]  . TRANSANAL HEMORRHOIDAL DEARTERIALIZATION  12/18/2014   Procedure: TRANSANAL HEMORRHOIDAL DEARTERIALIZATION;  Surgeon: Ross Levee, MD;  Location: [facility];  Service: General;;  . TRANSTHORACIC ECHOCARDIOGRAM  04-09-2012   dr  [name]   mild focal basal hyperthrophy of the septum,  ef 55-60%,  grade 1 diastolic dysfunction/  small gradient across MV consistent with repair (mean gradient 7mm Hg, valve area 2.39cm^2) with mild MR/  mild LAE/  trivial TR and PR     Current Outpatient Prescriptions  Medication Sig Dispense Refill  . aspirin 81 MG tablet Take 81 mg by mouth daily.      [name] dexlansoprazole (DEXILANT) 60 MG capsule Take one capsule every morning before breakfast 30 capsule 5  . losartan (COZAAR) 50 MG tablet TAKE 1 TABLET (50 MG TOTAL) BY MOUTH DAILY. 90 tablet 0  . metoprolol succinate  (TOPROL-XL) 25 MG 24 hr tablet Take 3 tablets (75 mg total) by mouth every morning. Take with or immediately following a meal. 270 tablet 3  . montelukast (SINGULAIR) 10 MG tablet Take one tablet once daily as directed 30 tablet 5  . Multiple Vitamins-Minerals (MULTIVITAMIN WITH MINERALS) tablet Take 1 tablet by mouth daily.    . Omega-3 Fatty Acids (OMEGA-3 FISH OIL) 1200 MG CAPS Take 1 capsule by mouth at bedtime.    . ranitidine (ZANTAC) 300 MG tablet Take one tablet each evening as directed 30 tablet 5   No current facility-administered medications for this visit.     Allergies:   Patient has no known allergies.    Social History:  The patient  reports that he has never smoked. He has never used smokeless tobacco. He reports that he drinks about 7.0 oz of alcohol per week . He reports that he does not use drugs.   Family History:  The patient's family history includes Cancer in his brother.    ROS:  Please see the history of present illness.    Review of Systems: Constitutional:  denies fever, chills, diaphoresis, appetite change and fatigue.  HEENT: denies photophobia, eye pain, redness, hearing loss, ear pain, congestion, sore throat, rhinorrhea, sneezing, neck pain, neck stiffness and tinnitus.  Respiratory: denies SOB, DOE, cough, chest tightness, and wheezing.  Cardiovascular: denies chest pain, palpitations and leg swelling.  Gastrointestinal: denies nausea, vomiting, abdominal pain, diarrhea, constipation, blood in stool.  Genitourinary: denies dysuria, urgency, frequency, hematuria, flank pain and difficulty urinating.  Musculoskeletal: denies  myalgias, back pain, joint swelling, arthralgias and gait problem.   Skin: denies pallor, rash and wound.  Neurological: denies dizziness, seizures, syncope, weakness, light-headedness, numbness and headaches.   Hematological: denies adenopathy, easy bruising, personal or family bleeding history.  Psychiatric/ Behavioral: denies  suicidal ideation, mood changes, confusion, nervousness, sleep disturbance and agitation.       All other systems are reviewed and negative.    PHYSICAL EXAM: VS:  BP (!) 142/100 (BP Location: Left Arm, Patient Position: Sitting, Cuff Size: Normal)   Pulse 77   Ht 6' (1.829 m)   Wt 176 lb 12.8 oz (80.2 kg)   SpO2 97%   BMI 23.98 kg/m  , BMI Body mass index is 23.98 kg/m. GEN: Well nourished, well developed, in no acute distress  HEENT: normal  Neck: no JVD, carotid bruits, or masses Cardiac: RRR; no murmurs, rubs, or gallops,no edema  Respiratory:  clear to auscultation bilaterally, normal work of breathing GI: soft, nontender, nondistended, + BS MS:  no deformity or atrophy  Skin: warm and dry, no rash Neuro:  Strength and sensation are intact Psych: normal   EKG:  EKG is ordered today.  Reviewed by me Ross PringleMarcy 27, 2018:  NSR at 77 with occasional PVCs    Recent Labs: 04/16/2015: BUN 21; Creat 1.28; Potassium 4.6; Sodium 139    Lipid Panel    Component Value Date/Time   CHOL 104 09/12/2011 0610   TRIG 123 09/12/2011 0610      Wt Readings from Last 3 Encounters:  04/12/16 176 lb 12.8 oz (80.2 kg)  03/29/16 182 lb 3.2 oz (82.6 kg)  03/26/15 179 lb (81.2 kg)      Other studies Reviewed: Additional studies/ records that were reviewed today include: . Review of the above records demonstrates:    ASSESSMENT AND PLAN:  1. 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 He's not having any recurrent symptoms of endocarditis.   He is well aware of the need to take SB prophylaxis prior to having any work done or other invasive procedures.  2. Hypertension - blood pressures Have been slightly elevated. We will add a thiazide 25 mg a day. We'll also add potassium chloride 10 mg a day. We'll check a basic medical profile in 3 weeks. I'll see him again in 6 months.  3. ED - will  try Viagra 20 mg as needed.    Current medicines are reviewed at length with the patient today.  The patient does not have concerns regarding medicines.  The following changes have been made:  See above   Signed, Ross MissPhilip Cieanna Stormes, MD  04/12/2016 4:28 PM    [facility] [address], [place], [place]  [address] Phone: [phone]; Fax: [phone]

## 2016-04-12 NOTE — Patient Instructions (Addendum)
Medication Instructions:  START HCTZ (Hydrochlorothiazide) 25 mg once daily START Kdur (Potassium) 10 meq once daily   Labwork: Your physician recommends that you return for lab work on April 18 between 7:30 am and 4:30 pm for basic metabolic panel   Testing/Procedures: None Ordered   Follow-Up: Your physician wants you to follow-up in: 6 months with Dr. Elease HashimotoNahser.  You will receive a reminder letter in the mail two months in advance. If you don't receive a letter, please call our office to schedule the follow-up appointment.   If you need a refill on your cardiac medications before your next appointment, please call your pharmacy.   Thank you for choosing [facility]! Eligha BridegroomMichelle Erva Koke, RN [phone]

## 2016-04-18 ENCOUNTER — Other Ambulatory Visit: Payer: Self-pay | Admitting: Cardiovascular Disease

## 2016-05-04 ENCOUNTER — Other Ambulatory Visit: Payer: 59

## 2016-05-04 ENCOUNTER — Telehealth: Payer: Self-pay | Admitting: Nurse Practitioner

## 2016-05-04 DIAGNOSIS — Z9889 Other specified postprocedural states: Secondary | ICD-10-CM

## 2016-05-04 DIAGNOSIS — I10 Essential (primary) hypertension: Secondary | ICD-10-CM

## 2016-05-04 NOTE — Telephone Encounter (Signed)
Called patient and left message that BP readings that were dropped off at the office were reviewed by Dr. Elease Hashimoto who advised he continue current medications 3/30 122/75 mmHg, HR 84 bpm 4/6 129/76 mmHg, HR 92 bpm 4/9 140/92 mmHg, HR 88 bpm 4/15 134/93 mmHg, HR 87 bpm

## 2016-05-05 LAB — BASIC METABOLIC PANEL
BUN/Creatinine Ratio: 13 (ref 10–24)
BUN: 18 mg/dL (ref 8–27)
CALCIUM: 9.5 mg/dL (ref 8.6–10.2)
CHLORIDE: 98 mmol/L (ref 96–106)
CO2: 25 mmol/L (ref 18–29)
Creatinine, Ser: 1.4 mg/dL — ABNORMAL HIGH (ref 0.76–1.27)
GFR calc Af Amer: 62 mL/min/{1.73_m2} (ref >59)
GFR calc non Af Amer: 53 mL/min/{1.73_m2} — ABNORMAL LOW (ref >59)
GLUCOSE: 107 mg/dL — AB (ref 65–99)
POTASSIUM: 4.1 mmol/L (ref 3.5–5.2)
SODIUM: 137 mmol/L (ref 134–144)

## 2016-05-26 ENCOUNTER — Other Ambulatory Visit: Payer: Self-pay

## 2016-05-26 MED ORDER — RANITIDINE HCL 300 MG PO TABS: ORAL_TABLET | ORAL | 1 refills | Status: DC

## 2016-05-26 NOTE — Telephone Encounter (Signed)
Received fax from [facility] in regards to a 90 day supply. I sent in the 90 day supply of Ranitidine.

## 2016-05-31 ENCOUNTER — Other Ambulatory Visit: Payer: Self-pay | Admitting: *Deleted

## 2016-05-31 MED ORDER — HYDROCHLOROTHIAZIDE 25 MG PO TABS: 25.0000 mg | ORAL_TABLET | Freq: Every day | ORAL | 3 refills | Status: DC

## 2016-06-21 ENCOUNTER — Other Ambulatory Visit: Payer: Self-pay | Admitting: Cardiovascular Disease

## 2016-07-06 ENCOUNTER — Telehealth: Payer: Self-pay | Admitting: Allergy and Immunology

## 2016-07-06 NOTE — Telephone Encounter (Signed)
Patient wants to discuss bill. [id number]#[id number]. He did not give me a DOS or the question for calling.

## 2016-07-06 NOTE — Telephone Encounter (Signed)
Left message to call me back - kt °

## 2016-07-06 NOTE — Telephone Encounter (Signed)
Will pay $75/mo - set up payment plan - kt

## 2016-08-28 ENCOUNTER — Other Ambulatory Visit: Payer: Self-pay | Admitting: Allergy and Immunology

## 2016-11-23 ENCOUNTER — Other Ambulatory Visit: Payer: Self-pay | Admitting: Allergy and Immunology

## 2017-02-21 ENCOUNTER — Encounter: Payer: Self-pay | Admitting: Cardiovascular Disease

## 2017-02-21 ENCOUNTER — Ambulatory Visit (INDEPENDENT_AMBULATORY_CARE_PROVIDER_SITE_OTHER): Payer: 59 | Admitting: Cardiovascular Disease

## 2017-02-21 ENCOUNTER — Other Ambulatory Visit: Payer: Self-pay | Admitting: Allergy and Immunology

## 2017-02-21 VITALS — BP 132/90 | HR 73 | Ht 74.0 in | Wt 175.5 lb

## 2017-02-21 DIAGNOSIS — I10 Essential (primary) hypertension: Secondary | ICD-10-CM

## 2017-02-21 DIAGNOSIS — Z9889 Other specified postprocedural states: Secondary | ICD-10-CM

## 2017-02-21 NOTE — Progress Notes (Signed)
Cardiology Office Note   Date:  02/21/2017   ID:  [name] E [name], DOB [birth year], MRN [id number]  PCP:  Patient, No Pcp Per  Cardiologist:   Kristeen MissPhilip Lesli Issa, MD   Chief Complaint  Patient presents with  . Follow-up    mitral valve repair     1. 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 Hypertension  History of Present Illness:  March 29, 2012:  [name] is seen today after a 2 year absence. He has done well. He is working out regularly - 3 days a week. No Cp or dyspnea. No syncope.   March 20, 2014:   [name] E Clinkenbeard is a 62 y.o. male who presents for follow up of his mitral valve replacement  He is doing great.   Some concerns about ED. No CP Takes his BP regularly   March 26, 2015:  Doing well.   BP is a bit high today .  Some extra anxiety.    By night , seems to be normal.  No CP, no dyspnea Colonoscopy last week -  Got a good report.     April 12, 2016:  Doing well.  No CP or dyspnea. BP is still elevated. Is having a hoarse, raspy voice.   Saw ENT.   Tried  steroid,  Abx Had allergy testing - not allergic to anything  Allergy thought that he may have had GERD , was started on Dexilant, singulair and Zantac.  BP at home is normal .   Works out 3 days a week - does cardio exercise  Feb. 5, 2019:    Doing well , Is bruising easily .   Wants to stop ASA  Traveling quite a bit. 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    Past Medical History:  Diagnosis Date  . Congenital hearing loss   . Diverticulosis of colon   . ED (erectile dysfunction)   . Grade III hemorrhoids   . Heart murmur   . Hemorrhoid   . History of bacterial endocarditis    2005--  poor dental hygiene   . History of diverticulitis of colon    s/p  colon resection for perferated diverticulitis 09-08-2011  . History of mitral valve prolapse    long hx mvp  w/ subsequent bacterial endocarditis 2005 with MV repair   .  Hypertension   . Left inguinal hernia   . S/P MVR (mitral valve repair)    cardiologist-  dr Melburn Poppernasher  . Wears glasses   . Wears hearing aid    bilateral    Past Surgical History:  Procedure Laterality Date  . APPENDECTOMY  09/08/2011   Procedure: APPENDECTOMY;  Surgeon: Velora Hecklerodd M Gerkin, MD;  Location: [facility];  Service: General;  Laterality: N/A;  incidental  . CARDIAC CATHETERIZATION  10-01-2003  dr Melburn Poppernasher   normal coronaries, mildly enlarged LV but well preserved LVSF  . COLOSTOMY CLOSURE  01/03/2012   Procedure: COLOSTOMY CLOSURE;  Surgeon: Velora Hecklerodd M Gerkin, MD;  [facility];  Service: General;  Laterality: N/A;  . HEMORRHOID SURGERY  1985  . INGUINAL HERNIA REPAIR Left 12/18/2014   Procedure: REPAIR LEFT INGUINAL HERNIA ;  Surgeon: Darnell Levelodd Gerkin, MD;  [facility];  Service: General;  Laterality: Left;  . INSERTION OF MESH N/A 12/18/2014   Procedure: INSERTION OF MESH;  Surgeon: Darnell Levelodd Gerkin, MD;  [facility] [place];  Service: General;  Laterality: N/A;  .  LAPAROTOMY  09/08/2011   Procedure: EXPLORATORY LAPAROTOMY;  Surgeon: Velora Heckler, MD;  Location: [facility];  Service: General;  Laterality: N/A;  sigmoid colectomy  . MITRAL VALVE ANNULOPLASTY  10-04-2003   dr gerhardt  . TRANSANAL HEMORRHOIDAL DEARTERIALIZATION  12/18/2014   Procedure: TRANSANAL HEMORRHOIDAL DEARTERIALIZATION;  Surgeon: Romie Levee, MD;  Location[facility];  Service: General;;  . TRANSTHORACIC ECHOCARDIOGRAM  04-09-2012   dr  Melburn Popper   mild focal basal hyperthrophy of the septum,  ef 55-60%,  grade 1 diastolic dysfunction/  small gradient across MV consistent with repair (mean gradient 7mm Hg, valve area 2.39cm^2) with mild MR/  mild LAE/  trivial TR and PR     Current Outpatient Medications  Medication Sig Dispense Refill  . aspirin 81 MG tablet Take 81 mg by mouth daily.      [name] dexlansoprazole (DEXILANT) 60 MG capsule Take one capsule every morning before  breakfast 30 capsule 5  . KLOR-CON M10 10 MEQ tablet Take 1 tablet by mouth daily.    [name] losartan (COZAAR) 50 MG tablet TAKE 1 TABLET (50 MG TOTAL) BY MOUTH DAILY. 90 tablet 2  . metoprolol succinate (TOPROL-XL) 25 MG 24 hr tablet TAKE 3 TABLETS (75 MG TOTAL) BY MOUTH EVERY MORNING. TAKE WITH OR IMMEDIATELY FOLLOWING A MEAL. 270 tablet 3  . montelukast (SINGULAIR) 10 MG tablet TAKE ONE TABLET ONCE DAILY AS DIRECTED 30 tablet 0  . Multiple Vitamins-Minerals (MULTIVITAMIN WITH MINERALS) tablet Take 1 tablet by mouth daily.    . Omega-3 Fatty Acids (OMEGA-3 FISH OIL) 1200 MG CAPS Take 1 capsule by mouth at bedtime.    . hydrochlorothiazide (HYDRODIURIL) 25 MG tablet Take 1 tablet (25 mg total) by mouth daily. 90 tablet 3   No current facility-administered medications for this visit.     Allergies:   Patient has no known allergies.    Social History:  The patient  reports that  has never smoked. he has never used smokeless tobacco. He reports that he drinks about 7.0 oz of alcohol per week. He reports that he does not use drugs.   Family History:  The patient's family history includes Cancer in his brother.    ROS:  Please see the history of present illness.   [name]    Physical Exam: Blood pressure 132/90, pulse 73, height 6\' 2"  (1.88 m), weight 175 lb 8 oz (79.6 kg), SpO2 98 %.  GEN:  Well nourished, well developed in no acute distress HEENT: Normal NECK: No JVD; No carotid bruits LYMPHATICS: No lymphadenopathy CARDIAC: RR, no murmurs, rubs, gallops RESPIRATORY:  Clear to auscultation without rales, wheezing or rhonchi  ABDOMEN: Soft, non-tender, non-distended MUSCULOSKELETAL:  No edema; No deformity  SKIN: Warm and dry NEUROLOGIC:  Alert and oriented x 3    EKG:    Feb. 5, 2019:    NSR at 70 , occasional PVCs otherwise normal   Recent Labs: 05/04/2016: BUN 18; Creatinine, Ser 1.40; Potassium 4.1; Sodium 137    Lipid Panel    Component Value Date/Time   CHOL 104 09/12/2011  0610   TRIG 123 09/12/2011 0610      Wt Readings from Last 3 Encounters:  02/21/17 175 lb 8 oz (79.6 kg)  04/12/16 176 lb 12.8 oz (80.2 kg)  03/29/16 182 lb 3.2 oz (82.6 kg)      Other studies Reviewed: Additional studies/ records that were reviewed today include: . Review of the above records demonstrates:    ASSESSMENT AND  PLAN:  1. 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 Hypertension - stable,   Will check BMP   Screening for TSH.   3. ED -     Current medicines are reviewed at length with the patient today.  The patient does not have concerns regarding medicines.  The following changes have been made:  See above   Signed, Kristeen Miss, MD  02/21/2017 2:39 PM    [facility] [address] [place], [place], [place]  [address] Phone: [phone]; Fax: [phone]

## 2017-02-21 NOTE — Telephone Encounter (Signed)
Courtesy refill  

## 2017-02-21 NOTE — Patient Instructions (Signed)
Medication Instructions:  Your physician has recommended you make the following change in your medication:  STOP Aspirin   Labwork: TODAY - TSH, BMET   Testing/Procedures: None Ordered   Follow-Up: Your physician wants you to follow-up in: 1 year with Dr. Elease HashimotoNahser.  You will receive a reminder letter in the mail two months in advance. If you don't receive a letter, please call our office to schedule the follow-up appointment.   If you need a refill on your cardiac medications before your next appointment, please call your pharmacy.   Thank you for choosing [facility]! Eligha BridegroomMichelle Swinyer, RN [phone]

## 2017-02-22 ENCOUNTER — Telehealth: Payer: Self-pay | Admitting: Cardiovascular Disease

## 2017-02-22 LAB — BASIC METABOLIC PANEL
BUN / CREAT RATIO: 14 (ref 10–24)
BUN: 19 mg/dL (ref 8–27)
CALCIUM: 10 mg/dL (ref 8.6–10.2)
CHLORIDE: 102 mmol/L (ref 96–106)
CO2: 24 mmol/L (ref 20–29)
Creatinine, Ser: 1.37 mg/dL — ABNORMAL HIGH (ref 0.76–1.27)
GFR, EST AFRICAN AMERICAN: 63 mL/min/{1.73_m2} (ref >59)
GFR, EST NON AFRICAN AMERICAN: 55 mL/min/{1.73_m2} — AB (ref >59)
Glucose: 88 mg/dL (ref 65–99)
POTASSIUM: 4.3 mmol/L (ref 3.5–5.2)
SODIUM: 141 mmol/L (ref 134–144)

## 2017-02-22 LAB — TSH: TSH: 2.61 u[IU]/mL (ref 0.450–4.500)

## 2017-02-22 NOTE — Telephone Encounter (Signed)
New Message   Patient is returning call in reference to lab results. Please call to discuss.  

## 2017-02-22 NOTE — Telephone Encounter (Signed)
Reviewed lab results and talked with patient about his medications. He states he very rarely takes anti-inflammatory medication. I advised that creatinine is very similar to last year and per Dr. Elease HashimotoNahser, he should continue current medications (I verified this with Dr. Elease HashimotoNahser who is in the office). He verbalized understanding and agreemetn. Patient states he is in the process of finding a PCP. He thanked me for the call.

## 2017-03-14 ENCOUNTER — Other Ambulatory Visit: Payer: Self-pay | Admitting: Cardiovascular Disease

## 2017-04-25 ENCOUNTER — Other Ambulatory Visit: Payer: Self-pay | Admitting: Cardiovascular Disease

## 2017-05-17 ENCOUNTER — Other Ambulatory Visit: Payer: Self-pay | Admitting: Cardiovascular Disease

## 2017-07-06 ENCOUNTER — Other Ambulatory Visit: Payer: Self-pay | Admitting: Cardiovascular Disease

## 2017-10-03 ENCOUNTER — Other Ambulatory Visit: Payer: Self-pay | Admitting: Cardiovascular Disease

## 2017-10-03 NOTE — Telephone Encounter (Signed)
Pt's pharmacy is requesting a refill on sildenafil. Would Dr. Nahser like to refill this medication? Please address 

## 2018-02-08 ENCOUNTER — Encounter: Payer: Self-pay | Admitting: Cardiovascular Disease

## 2018-02-27 ENCOUNTER — Encounter: Payer: Self-pay | Admitting: Cardiovascular Disease

## 2018-02-27 ENCOUNTER — Ambulatory Visit (INDEPENDENT_AMBULATORY_CARE_PROVIDER_SITE_OTHER): Payer: 59 | Admitting: Cardiovascular Disease

## 2018-02-27 VITALS — BP 126/80 | HR 102 | Ht 74.0 in | Wt 178.0 lb

## 2018-02-27 DIAGNOSIS — Z9889 Other specified postprocedural states: Secondary | ICD-10-CM | POA: Diagnosis not present

## 2018-02-27 DIAGNOSIS — I10 Essential (primary) hypertension: Secondary | ICD-10-CM | POA: Diagnosis not present

## 2018-02-27 DIAGNOSIS — R000 Tachycardia, unspecified: Secondary | ICD-10-CM

## 2018-02-27 MED ORDER — METOPROLOL SUCCINATE ER 100 MG PO TB24: 100.0000 mg | ORAL_TABLET | Freq: Every day | ORAL | 3 refills | Status: DC

## 2018-02-27 NOTE — Progress Notes (Signed)
Cardiology Office Note   Date:  02/27/2018   ID:  [name], DOB 09/21/1955, MRN [id number]  PCP:  Patient, No Pcp Per  Cardiologist:   Kristeen Miss, MD   Chief Complaint  Patient presents with  . Mitral Valve Prolapse     1. 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 Hypertension  History of Present Illness:  March 29, 2012:  [name] is seen today after a 2 year absence. He has done well. He is working out regularly - 3 days a week. No Cp or dyspnea. No syncope.   March 20, 2014:   [name] is a 63 y.o. male who presents for follow up of his mitral valve replacement  He is doing great.   Some concerns about ED. No CP Takes his BP regularly   March 26, 2015:  Doing well.   BP is a bit high today .  Some extra anxiety.    By night , seems to be normal.  No CP, no dyspnea Colonoscopy last week -  Got a good report.     April 12, 2016:  Doing well.  No CP or dyspnea. BP is still elevated. Is having a hoarse, raspy voice.   Saw ENT.   Tried  steroid,  Abx Had allergy testing - not allergic to anything  Allergy thought that he may have had GERD , was started on Dexilant, singulair and Zantac.  BP at home is normal .   Works out 3 days a week - does cardio exercise  Feb. 5, 2019:    Doing well , Is bruising easily .   Wants to stop ASA  Traveling quite a bit. Working in Clinical research associate.    Feb. 11, 2020: HR is a bit fast today  HR is been lower at his home readings.  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   Has 2 condos that he rents himself.     Past Medical History:  Diagnosis Date  . Congenital hearing loss   . Diverticulosis of colon   . ED (erectile dysfunction)   . Grade III hemorrhoids   . Heart murmur   . Hemorrhoid   . History of bacterial endocarditis    2005--  poor dental hygiene   . History of diverticulitis of colon     s/p  colon resection for perferated diverticulitis 09-08-2011  . History of mitral valve prolapse    long hx mvp  w/ subsequent bacterial endocarditis 2005 with MV repair   . Hypertension   . Left inguinal hernia   . S/P MVR (mitral valve repair)    cardiologist-  [name]  . Wears glasses   . Wears hearing aid    bilateral    Past Surgical History:  Procedure Laterality Date  . APPENDECTOMY  09/08/2011   Procedure: APPENDECTOMY;  Surgeon: Velora Heckler, MD;  Location: [facility];  Service: General;  Laterality: N/A;  incidental  . CARDIAC CATHETERIZATION  10-01-2003  [name]   normal coronaries, mildly enlarged LV but well preserved LVSF  . COLOSTOMY CLOSURE  01/03/2012   Procedure: COLOSTOMY CLOSURE;  Surgeon: Velora Heckler, MD;  Location: [facility];  Service: General;  Laterality: N/A;  . HEMORRHOID SURGERY  1985  . INGUINAL HERNIA REPAIR Left 12/18/2014   Procedure: REPAIR LEFT INGUINAL HERNIA ;  Surgeon: [name], MD;  [facility];  Service: General;  Laterality: Left;  . INSERTION OF MESH N/A 12/18/2014   Procedure: INSERTION OF MESH;  Surgeon: [name], MD;  Location: [facility];  Service: General;  Laterality: N/A;  . LAPAROTOMY  09/08/2011   Procedure: EXPLORATORY LAPAROTOMY;  Surgeon: Velora Heckler, MD;  Location: [facility];  Service: General;  Laterality: N/A;  sigmoid colectomy  . MITRAL VALVE ANNULOPLASTY  10-04-2003   dr gerhardt  . TRANSANAL HEMORRHOIDAL DEARTERIALIZATION  12/18/2014   Procedure: TRANSANAL HEMORRHOIDAL DEARTERIALIZATION;  Surgeon: Romie Levee, MD;  Location: [facility];  Service: General;;  . TRANSTHORACIC ECHOCARDIOGRAM  04-09-2012   dr  Melburn Popper   mild focal basal hyperthrophy of the septum,  ef 55-60%,  grade 1 diastolic dysfunction/  small gradient across MV consistent with repair (mean gradient 72mm Hg, valve area 2.39cm^2) with mild MR/  mild LAE/  trivial TR and PR     Current Outpatient  Medications  Medication Sig Dispense Refill  . amoxicillin (AMOXIL) 500 MG capsule Take 4 capsules by mouth. 4 Capsules by mouth 1 hour before dental appt    . dexlansoprazole (DEXILANT) 60 MG capsule Take one capsule every morning before breakfast 30 capsule 5  . hydrochlorothiazide (HYDRODIURIL) 25 MG tablet TAKE 1 TABLET BY MOUTH EVERY DAY 90 tablet 2  . KLOR-CON M10 10 MEQ tablet TAKE 1 TABLET DAILY 30 tablet 11  . losartan (COZAAR) 50 MG tablet TAKE 1 TABLET (50 MG TOTAL) BY MOUTH DAILY. 90 tablet 3  . metoprolol succinate (TOPROL-XL) 25 MG 24 hr tablet TAKE 3 TABLETS (75 MG TOTAL) BY MOUTH EVERY MORNING. TAKE WITH OR IMMEDIATELY FOLLOWING A MEAL. 270 tablet 2  . montelukast (SINGULAIR) 10 MG tablet TAKE ONE TABLET ONCE DAILY AS DIRECTED 30 tablet 0  . Multiple Vitamins-Minerals (MULTIVITAMIN WITH MINERALS) tablet Take 1 tablet by mouth daily.    . Omega-3 Fatty Acids (OMEGA-3 FISH OIL) 1200 MG CAPS Take 1 capsule by mouth at bedtime.    . sildenafil (REVATIO) 20 MG tablet TAKE 1-5 TABLETS BY MOUTH AS NEEDED FOR SEXUAL ACTIVITY 260 tablet 0   No current facility-administered medications for this visit.     Allergies:   Patient has no known allergies.    Social History:  The patient  reports that he has never smoked. He has never used smokeless tobacco. He reports current alcohol use of about 14.0 standard drinks of alcohol per week. He reports that he does not use drugs.   Family History:  The patient's family history includes Cancer in his brother.    ROS:  Please see the history of present illness.   [name]    Physical Exam: Blood pressure 126/80, pulse (!) 102, height 6\' 2"  (1.88 m), weight 178 lb (80.7 kg), SpO2 95 %.  GEN:  Well nourished, well developed in no acute distress HEENT: Normal NECK: No JVD; No carotid bruits LYMPHATICS: No lymphadenopathy CARDIAC: RRR  ,  Mild tachycardia.   Frequent premature ventricular contractions soft systolic murmur  RESPIRATORY:  Clear to  auscultation without rales, wheezing or rhonchi  ABDOMEN: Soft, non-tender, non-distended MUSCULOSKELETAL:  No edema; No deformity  SKIN: Warm and dry NEUROLOGIC:  Alert and oriented x 3   EKG:     Feb.   11, 2020 :   Sinus tach at 102.  Frequent PVCs  Recent Labs: No results found for requested labs within last 8760 hours.    Lipid Panel    Component Value Date/Time  CHOL 104 09/12/2011 0610   TRIG 123 09/12/2011 0610      Wt Readings from Last 3 Encounters:  02/27/18 178 lb (80.7 kg)  02/21/17 175 lb 8 oz (79.6 kg)  04/12/16 176 lb 12.8 oz (80.2 kg)      Other studies Reviewed: Additional studies/ records that were reviewed today include: . Review of the above records demonstrates:    ASSESSMENT AND PLAN:  1. 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  2. Hypertension -pressures fairly well controlled.  He wanted to stop his HCTZ but I think his diastolic blood pressure will increase too much.  3.  Tachycardia: We will increase his Toprol-XL to 100 mg a day.  He still having lots of PVCs.  We will see him again in 3 months to recheck his heart rate and his PVCs.. We will check a TSH, basic metabolic profile, CBC today.     Current medicines are reviewed at length with the patient today.  The patient does not have concerns regarding medicines.  The following changes have been made:  See above   Signed, Kristeen MissPhilip Caliber Landess, MD  02/27/2018 4:33 PM    [facility] [address], [place], [place]  [address] Phone: [phone]; Fax: [phone]) [phone]

## 2018-02-27 NOTE — Patient Instructions (Signed)
Medication Instructions:  Your physician has recommended you make the following change in your medication:  INCREASE Toprol XL (Metoprolol) to 100 mg once daily  If you need a refill on your cardiac medications before your next appointment, please call your pharmacy.   Lab work: TODAY - CBC, TSH, BMET  If you have labs (blood work) drawn today and your tests are completely normal, you will receive your results only by: [name]. MyChart Message (if you have MyChart) OR . A paper copy in the mail If you have any lab test that is abnormal or we need to change your treatment, we will call you to review the results.  Testing/Procedures: None Ordered   Follow-Up: Your physician recommends that you schedule a follow-up appointment in: 3 months with Dr. Elease HashimotoNahser

## 2018-02-28 ENCOUNTER — Telehealth: Payer: Self-pay

## 2018-02-28 LAB — CBC
Hematocrit: 43.6 % (ref 37.5–51.0)
Hemoglobin: 14.3 g/dL (ref 13.0–17.7)
MCH: 31.8 pg (ref 26.6–33.0)
MCHC: 32.8 g/dL (ref 31.5–35.7)
MCV: 97 fL (ref 79–97)
PLATELETS: 220 10*3/uL (ref 150–450)
RBC: 4.5 x10E6/uL (ref 4.14–5.80)
RDW: 13.3 % (ref 11.6–15.4)
WBC: 9.2 10*3/uL (ref 3.4–10.8)

## 2018-02-28 LAB — BASIC METABOLIC PANEL
BUN / CREAT RATIO: 14 (ref 10–24)
BUN: 21 mg/dL (ref 8–27)
CALCIUM: 9.7 mg/dL (ref 8.6–10.2)
CO2: 26 mmol/L (ref 20–29)
Chloride: 102 mmol/L (ref 96–106)
Creatinine, Ser: 1.5 mg/dL — ABNORMAL HIGH (ref 0.76–1.27)
GFR calc non Af Amer: 49 mL/min/{1.73_m2} — ABNORMAL LOW (ref >59)
GFR, EST AFRICAN AMERICAN: 56 mL/min/{1.73_m2} — AB (ref >59)
Glucose: 88 mg/dL (ref 65–99)
POTASSIUM: 4.5 mmol/L (ref 3.5–5.2)
Sodium: 142 mmol/L (ref 134–144)

## 2018-02-28 LAB — HEPATIC FUNCTION PANEL
ALT: 20 IU/L (ref 0–44)
AST: 22 IU/L (ref 0–40)
Albumin: 4.5 g/dL (ref 3.8–4.8)
Alkaline Phosphatase: 43 IU/L (ref 39–117)
BILIRUBIN TOTAL: 0.5 mg/dL (ref 0.0–1.2)
BILIRUBIN, DIRECT: 0.14 mg/dL (ref 0.00–0.40)
TOTAL PROTEIN: 6.8 g/dL (ref 6.0–8.5)

## 2018-02-28 MED ORDER — HYDROCHLOROTHIAZIDE 12.5 MG PO CAPS: 12.5000 mg | ORAL_CAPSULE | Freq: Every day | ORAL | 3 refills | Status: DC

## 2018-02-28 NOTE — Telephone Encounter (Signed)
Pt verbalized understanding of Dr. Harvie Bridge recommendation to decrease his HCTZ to 12.5mg  a day... he will keep track of his BP and will call if he develops any problems... will keep OV 06/15/18 with Dr. Elease Hashimoto.

## 2018-02-28 NOTE — Telephone Encounter (Signed)
-----   Message from Vesta Mixer, MD sent at 02/28/2018  1:05 PM EST ----- BUN and creatinine are slightly elevated.  He thought that he was a bid dehydrated ( was urinating more than he wanted )  and he was tachycardic. Given his BUN and Cr. Results, I would agree that he may have been volume depleted.   reduce his HCTZ to 12. 5 mg a day . Have him continue to check his BP regularly

## 2018-04-07 ENCOUNTER — Other Ambulatory Visit: Payer: Self-pay | Admitting: Cardiovascular Disease

## 2018-04-27 ENCOUNTER — Other Ambulatory Visit: Payer: Self-pay | Admitting: Cardiovascular Disease

## 2018-05-03 ENCOUNTER — Telehealth: Payer: Self-pay | Admitting: Cardiovascular Disease

## 2018-05-03 ENCOUNTER — Other Ambulatory Visit: Payer: Self-pay | Admitting: Cardiovascular Disease

## 2018-05-03 NOTE — Telephone Encounter (Signed)
Called pt and left message informing pt that I called his pharmacy about his refills on metoprolol 100 mg tablet daily and his pharmacy is getting his medication ready for him to pick up and that is why his pharmacy kept saying that he did not have refills, because pt had been trying to refill the old Rx metoprolol 25 mg tablets.

## 2018-05-20 ENCOUNTER — Other Ambulatory Visit: Payer: Self-pay | Admitting: Cardiovascular Disease

## 2018-05-21 ENCOUNTER — Other Ambulatory Visit: Payer: Self-pay | Admitting: Cardiovascular Disease

## 2018-05-21 MED ORDER — HYDROCHLOROTHIAZIDE 12.5 MG PO CAPS: 12.5000 mg | ORAL_CAPSULE | Freq: Every day | ORAL | 3 refills | Status: DC

## 2018-05-21 NOTE — Telephone Encounter (Signed)
Pt's medication was sent to pt's pharmacy as requested. Confirmation received.  °

## 2018-06-15 ENCOUNTER — Ambulatory Visit: Payer: 59 | Admitting: Cardiovascular Disease

## 2018-06-23 ENCOUNTER — Other Ambulatory Visit: Payer: Self-pay | Admitting: Cardiovascular Disease

## 2018-09-30 NOTE — Progress Notes (Signed)
Cardiology Office Note   Date:  10/01/2018   ID:  [name], DOB [birth year], MRN [id number]  PCP:  Patient, No Pcp Per  Cardiologist:   Kristeen MissPhilip Nahser, MD   Chief Complaint  Patient presents with  . Mitral Valve Prolapse     1. 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 Hypertension     March 29, 2012:  [name] is seen today after a 2 year absence. He has done well. He is working out regularly - 3 days a week. No Cp or dyspnea. No syncope.   March 20, 2014:   [name] is a 63 y.o. male who presents for follow up of his mitral valve replacement  He is doing great.   Some concerns about ED. No CP Takes his BP regularly   March 26, 2015:  Doing well.   BP is a bit high today .  Some extra anxiety.    By night , seems to be normal.  No CP, no dyspnea Colonoscopy last week -  Got a good report.     April 12, 2016:  Doing well.  No CP or dyspnea. BP is still elevated. Is having a hoarse, raspy voice.   Saw ENT.   Tried  steroid,  Abx Had allergy testing - not allergic to anything  Allergy thought that he may have had GERD , was started on Dexilant, singulair and Zantac.  BP at home is normal .   Works out 3 days a week - does cardio exercise  Feb. 5, 2019:    Doing well , Is bruising easily .   Wants to stop ASA  Traveling quite a bit. 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    Feb. 11, 2020: HR is a bit fast today  HR is been lower at his home readings.  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   Has 2 condos that he rents himself.    Sept. 14, 2020   [name] is seen today for follow up of endocardis [place] since I last saw him.  Is doing well.    [place] house in [place], [place]   Completely gutted the house and has redone it .  No CP or dyspnea.   Feels very healthy. Does lots of his own hard work .     Past Medical  History:  Diagnosis Date  . Congenital hearing loss   . Diverticulosis of colon   . ED (erectile dysfunction)   . Grade III hemorrhoids   . Heart murmur   . Hemorrhoid   . History of bacterial endocarditis    2005--  poor dental hygiene   . History of diverticulitis of colon    s/p  colon resection for perferated diverticulitis 09-08-2011  . History of mitral valve prolapse    long hx mvp  w/ subsequent bacterial endocarditis 2005 with MV repair   . Hypertension   . Left inguinal hernia   . S/P MVR (mitral valve repair)    cardiologist-  dr Melburn Poppernasher  . Wears glasses   . Wears hearing aid    bilateral    Past Surgical History:  Procedure Laterality Date  . APPENDECTOMY  09/08/2011   Procedure: APPENDECTOMY;  Surgeon: Velora Hecklerodd M Gerkin, MD;  Location: [facility];  Service: General;  Laterality: N/A;  incidental  . CARDIAC CATHETERIZATION  10-01-2003  dr Melburn Poppernasher  normal coronaries, mildly enlarged LV but well preserved LVSF  . COLOSTOMY CLOSURE  01/03/2012   Procedure: COLOSTOMY CLOSURE;  Surgeon: Velora Heckler, MD;  Location: [facility];  Service: General;  Laterality: N/A;  . HEMORRHOID SURGERY  1985  . INGUINAL HERNIA REPAIR Left 12/18/2014   Procedure: REPAIR LEFT INGUINAL HERNIA ;  Surgeon: [name], MD;  Location: [facility];  Service: General;  Laterality: Left;  . INSERTION OF MESH N/A 12/18/2014   Procedure: INSERTION OF MESH;  Surgeon: [name], MD;  Location: [facility];  Service: General;  Laterality: N/A;  . LAPAROTOMY  09/08/2011   Procedure: EXPLORATORY LAPAROTOMY;  Surgeon: Velora Heckler, MD;  Location: [facility];  Service: General;  Laterality: N/A;  sigmoid colectomy  . MITRAL VALVE ANNULOPLASTY  10-04-2003   dr gerhardt  . TRANSANAL HEMORRHOIDAL DEARTERIALIZATION  12/18/2014   Procedure: TRANSANAL HEMORRHOIDAL DEARTERIALIZATION;  Surgeon: Romie Levee, MD;  Location: [facility];  Service: General;;  . TRANSTHORACIC  ECHOCARDIOGRAM  04-09-2012   dr  Melburn Popper   mild focal basal hyperthrophy of the septum,  ef 55-60%,  grade 1 diastolic dysfunction/  small gradient across MV consistent with repair (mean gradient 17mm Hg, valve area 2.39cm^2) with mild MR/  mild LAE/  trivial TR and PR     Current Outpatient Medications  Medication Sig Dispense Refill  . amoxicillin (AMOXIL) 500 MG capsule Take 4 capsules by mouth. 4 Capsules by mouth 1 hour before dental appt    . hydrochlorothiazide (HYDRODIURIL) 25 MG tablet Take 25 mg by mouth daily.    [name] KLOR-CON M10 10 MEQ tablet TAKE 1 TABLET DAILY 90 tablet 2  . losartan (COZAAR) 25 MG tablet TAKE 2 TABLETS BY MOUTH EVERY DAY 180 tablet 3  . metoprolol succinate (TOPROL-XL) 100 MG 24 hr tablet Take 1 tablet (100 mg total) by mouth daily. Take with or immediately following a meal. 90 tablet 3  . montelukast (SINGULAIR) 10 MG tablet TAKE ONE TABLET ONCE DAILY AS DIRECTED 30 tablet 0  . Multiple Vitamins-Minerals (MULTIVITAMIN WITH MINERALS) tablet Take 1 tablet by mouth daily.    . Omega-3 Fatty Acids (OMEGA-3 FISH OIL) 1200 MG CAPS Take 1 capsule by mouth at bedtime.    . sildenafil (REVATIO) 20 MG tablet TAKE 1-5 TABLETS BY MOUTH AS NEEDED FOR SEXUAL ACTIVITY 260 tablet 0   No current facility-administered medications for this visit.     Allergies:   Patient has no known allergies.    Social History:  The patient  reports that he has never smoked. He has never used smokeless tobacco. He reports current alcohol use of about 14.0 standard drinks of alcohol per week. He reports that he does not use drugs.   Family History:  The patient's family history includes Cancer in his brother.    ROS:  Please see the history of present illness.   [name]  Physical Exam: Blood pressure 138/88, pulse 79, height 6\' 2"  (1.88 m), weight 173 lb 1.9 oz (78.5 kg), SpO2 99 %.  GEN:  Well nourished, well developed in no acute distress HEENT: Normal NECK: No JVD; No carotid bruits  LYMPHATICS: No lymphadenopathy CARDIAC: RRR ,  Occasional premature beats.   RESPIRATORY:  Clear to auscultation without rales, wheezing or rhonchi  ABDOMEN: Soft, non-tender, non-distended MUSCULOSKELETAL:  No edema; No deformity  SKIN: Warm and dry NEUROLOGIC:  Alert and oriented x 3    EKG:    Recent Labs: 02/27/2018:  ALT 20; BUN 21; Creatinine, Ser 1.50; Hemoglobin 14.3; Platelets 220; Potassium 4.5; Sodium 142    Lipid Panel    Component Value Date/Time   CHOL 104 09/12/2011 0610   TRIG 123 09/12/2011 0610      Wt Readings from Last 3 Encounters:  10/01/18 173 lb 1.9 oz (78.5 kg)  02/27/18 178 lb (80.7 kg)  02/21/17 175 lb 8 oz (79.6 kg)      Other studies Reviewed: Additional studies/ records that were reviewed today include: . Review of the above records demonstrates:    ASSESSMENT AND PLAN:  1. 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    2. Hypertension -  BP is well controlled.  Bmp today  Continue meds.    3.  Tachycardia: resolved.       Current medicines are reviewed at length with the patient today.  The patient does not have concerns regarding medicines.  The following changes have been made:  See above   Signed, Mertie Moores, MD  10/01/2018 3:45 PM    [facility] [address], [place], [place]  [address] Phone: [phone]; Fax: [phone]

## 2018-10-01 ENCOUNTER — Ambulatory Visit (INDEPENDENT_AMBULATORY_CARE_PROVIDER_SITE_OTHER): Payer: 59 | Admitting: Cardiovascular Disease

## 2018-10-01 ENCOUNTER — Encounter: Payer: Self-pay | Admitting: Cardiovascular Disease

## 2018-10-01 ENCOUNTER — Other Ambulatory Visit: Payer: Self-pay

## 2018-10-01 VITALS — BP 138/88 | HR 79 | Ht 74.0 in | Wt 173.1 lb

## 2018-10-01 DIAGNOSIS — I10 Essential (primary) hypertension: Secondary | ICD-10-CM

## 2018-10-01 DIAGNOSIS — Z9889 Other specified postprocedural states: Secondary | ICD-10-CM | POA: Diagnosis not present

## 2018-10-01 NOTE — Patient Instructions (Signed)
Medication Instructions:  Your physician recommends that you continue on your current medications as directed. Please refer to the Current Medication list given to you today.  If you need a refill on your cardiac medications before your next appointment, please call your pharmacy.   Lab work: TODAY - basic metabolic panel  If you have labs (blood work) drawn today and your tests are completely normal, you will receive your results only by: . MyChart Message (if you have MyChart) OR . A paper copy in the mail If you have any lab test that is abnormal or we need to change your treatment, we will call you to review the results.   Testing/Procedures: None Ordered   Follow-Up: At [facility], you and your health needs are our priority.  As part of our continuing mission to provide you with exceptional heart care, we have created designated Provider Care Teams.  These Care Teams include your primary Cardiologist (physician) and Advanced Practice Providers (APPs -  Physician Assistants and Nurse Practitioners) who all work together to provide you with the care you need, when you need it. You will need a follow up appointment in:  1 years.  Please call our office 2 months in advance to schedule this appointment.  You may see Philip Nahser, MD or one of the following Advanced Practice Providers on your designated Care Team: Scott Weaver, PA-C Vin Bhagat, PA-C . Janine Hammond, NP    

## 2018-10-02 LAB — BASIC METABOLIC PANEL
BUN/Creatinine Ratio: 15 (ref 10–24)
BUN: 23 mg/dL (ref 8–27)
CO2: 25 mmol/L (ref 20–29)
Calcium: 9.7 mg/dL (ref 8.6–10.2)
Chloride: 99 mmol/L (ref 96–106)
Creatinine, Ser: 1.49 mg/dL — ABNORMAL HIGH (ref 0.76–1.27)
GFR calc Af Amer: 57 mL/min/{1.73_m2} — ABNORMAL LOW (ref >59)
GFR calc non Af Amer: 49 mL/min/{1.73_m2} — ABNORMAL LOW (ref >59)
Glucose: 91 mg/dL (ref 65–99)
Potassium: 4.8 mmol/L (ref 3.5–5.2)
Sodium: 140 mmol/L (ref 134–144)

## 2018-12-26 ENCOUNTER — Telehealth: Payer: Self-pay | Admitting: *Deleted

## 2018-12-26 NOTE — Telephone Encounter (Addendum)
   [facility] Pre-operative Risk Assessment    Request for surgical clearance:  1. What type of surgery is being performed? SEPTOPLASTY & B/L INFERIOR TURBINATE SUBMUCOUS RESECTION  2. When is this surgery scheduled? 01/03/19   3. What type of clearance is required (medical clearance vs. Pharmacy clearance to hold med vs. Both)? MEDICAL  4. Are there any medications that need to be held prior to surgery and how long? NONE LISTED  5. Practice name and name of physician performing surgery? [facility]; DR. Laurell Roof  6. What is your office phone number [phone]    7.   What is your office fax number [phone]  8.   Anesthesia type (None, local, MAC, general) ? GENERAL   Julaine Hua 12/26/2018, 4:50 PM  _________________________________________________________________   (provider comments below)

## 2018-12-27 ENCOUNTER — Telehealth: Payer: Self-pay | Admitting: Cardiovascular Disease

## 2018-12-27 NOTE — Telephone Encounter (Signed)
New Message   Pt is returning call to [facility]     Please call back

## 2018-12-27 NOTE — Telephone Encounter (Signed)
   Primary Cardiologist: Mertie Moores, MD  Chart reviewed and patient contacted by phone today as part of pre-operative protocol coverage. Given past medical history and time since last visit, based on ACC/AHA guidelines, CARRICK RIJOS would be at acceptable risk for the planned procedure without further cardiovascular testing.   I will route this recommendation to the requesting party via Epic fax function and remove from pre-op pool.  Please call with questions.  Kerin Ransom, PA-C 12/27/2018, 11:46 AM

## 2018-12-27 NOTE — Telephone Encounter (Signed)
I s/w pt today in regards to surgeon's name who will be performing his surgery. Pt states name of the surgeon is Dr. Laurell Roof. I will update the surgery clearance request.

## 2018-12-27 NOTE — Telephone Encounter (Signed)
Left message to call back  

## 2018-12-27 NOTE — Telephone Encounter (Signed)
You are correct.  The current guidelines suggest SBE prophylaxis only for dental procedures.

## 2018-12-27 NOTE — Telephone Encounter (Signed)
Pharmacy can you comment on SBE prophylaxis in this patient with a history of endocarditis and prior MV repair prior to ENT surgery.  My research suggested no SBE required- just want to confirm this.  Kerin Ransom PA-C 12/27/2018 9:38 AM

## 2019-01-01 ENCOUNTER — Telehealth: Payer: Self-pay | Admitting: Cardiovascular Disease

## 2019-01-01 NOTE — Telephone Encounter (Signed)
Anderson Malta from [facility] in [place], [place] was calling to request records for this patient. The patient is set to have a surgical procedure at this office Thursday 01/03/19. [facility] simply needs any test results (echo, stress test, ekg) and the most recent office note sent to them so they can clear him for his procedure Thursday.  Records can be faxed to their office at [phone]

## 2019-02-01 ENCOUNTER — Telehealth: Payer: Self-pay | Admitting: Cardiovascular Disease

## 2019-02-01 MED ORDER — AMOXICILLIN 500 MG PO CAPS: ORAL_CAPSULE | ORAL | 4 refills | Status: AC

## 2019-02-01 NOTE — Telephone Encounter (Signed)
Patient calling stating that he is changing dentists and that his new dentist wants Dr. Elease Hashimoto to call in the amoxicillin (AMOXIL) 500 MG capsule prescription to the patient's pharmacy the first time to get it established.

## 2019-02-01 NOTE — Telephone Encounter (Signed)
Refill of patient's amoxicillin has been sent to his pharmacy.

## 2019-03-08 ENCOUNTER — Other Ambulatory Visit: Payer: Self-pay | Admitting: Nurse Practitioner

## 2019-03-08 DIAGNOSIS — R000 Tachycardia, unspecified: Secondary | ICD-10-CM

## 2019-03-08 DIAGNOSIS — Z9889 Other specified postprocedural states: Secondary | ICD-10-CM

## 2019-03-08 DIAGNOSIS — I10 Essential (primary) hypertension: Secondary | ICD-10-CM

## 2019-03-12 ENCOUNTER — Other Ambulatory Visit: Payer: 59

## 2019-03-12 ENCOUNTER — Other Ambulatory Visit: Payer: Self-pay

## 2019-03-12 DIAGNOSIS — I10 Essential (primary) hypertension: Secondary | ICD-10-CM

## 2019-03-12 DIAGNOSIS — R000 Tachycardia, unspecified: Secondary | ICD-10-CM

## 2019-03-12 DIAGNOSIS — Z9889 Other specified postprocedural states: Secondary | ICD-10-CM

## 2019-03-13 LAB — BASIC METABOLIC PANEL
BUN/Creatinine Ratio: 17 (ref 10–24)
BUN: 23 mg/dL (ref 8–27)
CO2: 23 mmol/L (ref 20–29)
Calcium: 9.6 mg/dL (ref 8.6–10.2)
Chloride: 102 mmol/L (ref 96–106)
Creatinine, Ser: 1.36 mg/dL — ABNORMAL HIGH (ref 0.76–1.27)
GFR calc Af Amer: 63 mL/min/{1.73_m2} (ref >59)
GFR calc non Af Amer: 55 mL/min/{1.73_m2} — ABNORMAL LOW (ref >59)
Glucose: 157 mg/dL — ABNORMAL HIGH (ref 65–99)
Potassium: 4.1 mmol/L (ref 3.5–5.2)
Sodium: 140 mmol/L (ref 134–144)

## 2019-03-13 LAB — PRO B NATRIURETIC PEPTIDE: NT-Pro BNP: 426 pg/mL — ABNORMAL HIGH (ref 0–210)

## 2019-04-11 ENCOUNTER — Other Ambulatory Visit: Payer: Self-pay | Admitting: Nurse Practitioner

## 2019-04-11 MED ORDER — POTASSIUM CHLORIDE CRYS ER 10 MEQ PO TBCR: 10.0000 meq | EXTENDED_RELEASE_TABLET | Freq: Every day | ORAL | 3 refills | Status: AC

## 2019-04-25 MED ORDER — LOSARTAN POTASSIUM 25 MG PO TABS: 50.0000 mg | ORAL_TABLET | Freq: Every day | ORAL | 1 refills | Status: DC

## 2019-05-22 ENCOUNTER — Other Ambulatory Visit: Payer: Self-pay

## 2019-05-22 MED ORDER — METOPROLOL SUCCINATE ER 100 MG PO TB24: 100.0000 mg | ORAL_TABLET | Freq: Every day | ORAL | 1 refills | Status: DC

## 2019-05-22 NOTE — Telephone Encounter (Signed)
Pt's medication was sent to pt's pharmacy as requested. Confirmation received.  °

## 2019-05-23 ENCOUNTER — Other Ambulatory Visit: Payer: Self-pay

## 2019-08-16 ENCOUNTER — Other Ambulatory Visit: Payer: Self-pay

## 2019-08-16 MED ORDER — HYDROCHLOROTHIAZIDE 25 MG PO TABS: 25.0000 mg | ORAL_TABLET | Freq: Every day | ORAL | 0 refills | Status: DC

## 2019-10-07 ENCOUNTER — Ambulatory Visit: Payer: 59 | Admitting: Cardiovascular Disease

## 2019-10-10 ENCOUNTER — Encounter: Payer: Self-pay | Admitting: Cardiovascular Disease

## 2019-10-10 NOTE — Progress Notes (Signed)
Cardiology Office Note   Date:  10/12/2019   ID:  [name], DOB Aug 31, 1955, MRN [id number]  PCP:  Patient, No Pcp Per  Cardiologist:   Kristeen Miss, MD   Chief Complaint  Patient presents with  . Hypertension  . Mitral Regurgitation     1. 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 Hypertension     March 29, 2012:  [name] is seen today after a 2 year absence. He has done well. He is working out regularly - 3 days a week. No Cp or dyspnea. No syncope.   March 20, 2014:   FERNANDEZ KENLEY is a 64 y.o. male who presents for follow up of his mitral valve replacement  He is doing great.   Some concerns about ED. No CP Takes his BP regularly   March 26, 2015:  Doing well.   BP is a bit high today .  Some extra anxiety.    By night , seems to be normal.  No CP, no dyspnea Colonoscopy last week -  Got a good report.     April 12, 2016:  Doing well.  No CP or dyspnea. BP is still elevated. Is having a hoarse, raspy voice.   Saw ENT.   Tried  steroid,  Abx Had allergy testing - not allergic to anything  Allergy thought that he may have had GERD , was started on Dexilant, singulair and Zantac.  BP at home is normal .   Works out 3 days a week - does cardio exercise  Feb. 5, 2019:    Doing well , Is bruising easily .   Wants to stop ASA  Traveling quite a bit. Working in Clinical research associate.    Feb. 11, 2020: HR is a bit fast today  HR is been lower at his home readings.  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   Has 2 condos that he rents himself.    Sept. 14, 2020   [name] is seen today for follow up of endocardis Has moved to [facility] since I last saw him.  Is doing well.    Bought a dated house in [place], [place].   Completely gutted the house and has redone it .  No CP or dyspnea.   Feels very healthy. Does lots of his own yard work .     Sept. 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    Past Medical History:  Diagnosis Date  . Congenital hearing loss   . Diverticulosis of colon   . ED (erectile dysfunction)   . Grade III hemorrhoids   . Heart murmur   . Hemorrhoid   . History of bacterial endocarditis    2005--  poor dental hygiene   . History of diverticulitis of colon    s/p  colon resection for perferated diverticulitis 09-08-2011  . History of mitral valve prolapse    long hx mvp  w/ subsequent bacterial endocarditis 2005 with MV repair   . Hypertension   . Left inguinal hernia   . S/P MVR (mitral valve repair)    cardiologist-  [name]  . Wears glasses   . Wears hearing aid  bilateral    Past Surgical History:  Procedure Laterality Date  . APPENDECTOMY  09/08/2011   Procedure: APPENDECTOMY;  Surgeon: Velora Heckler, MD;  Location: [facility];  Service: General;  Laterality: N/A;  incidental  . CARDIAC CATHETERIZATION  10-01-2003  [name]   normal coronaries, mildly enlarged LV but well preserved LVSF  . COLOSTOMY CLOSURE  01/03/2012   Procedure: COLOSTOMY CLOSURE;  Surgeon: Velora Heckler, MD;  Location[facility];  Service: General;  Laterality: N/A;  . HEMORRHOID SURGERY  1985  . INGUINAL HERNIA REPAIR Left 12/18/2014   Procedure: REPAIR LEFT INGUINAL HERNIA ;  Surgeon: [name], MD;  Location: [facility];  Service: General;  Laterality: Left;  . INSERTION OF MESH N/A 12/18/2014   Procedure: INSERTION OF MESH;  Surgeon: [name], MD;  Location: [facility];  Service: General;  Laterality: N/A;  . LAPAROTOMY  09/08/2011   Procedure: EXPLORATORY LAPAROTOMY;  Surgeon: Velora Heckler, MD;  Location: [facility];  Service: General;  Laterality: N/A;  sigmoid colectomy  . MITRAL VALVE  ANNULOPLASTY  10-04-2003   dr gerhardt  . TRANSANAL HEMORRHOIDAL DEARTERIALIZATION  12/18/2014   Procedure: TRANSANAL HEMORRHOIDAL DEARTERIALIZATION;  Surgeon: Romie Levee, MD;  Location: [facility];  Service: General;;  . TRANSTHORACIC ECHOCARDIOGRAM  04-09-2012   dr  Melburn Popper   mild focal basal hyperthrophy of the septum,  ef 55-60%,  grade 1 diastolic dysfunction/  small gradient across MV consistent with repair (mean gradient 70mm Hg, valve area 2.39cm^2) with mild MR/  mild LAE/  trivial TR and PR     Current Outpatient Medications  Medication Sig Dispense Refill  . amoxicillin (AMOXIL) 500 MG capsule 4 Capsules by mouth 1 hour before dental appt 4 capsule 4  . hydrochlorothiazide (HYDRODIURIL) 25 MG tablet Take 1 tablet (25 mg total) by mouth daily. 30 tablet 11  . losartan (COZAAR) 50 MG tablet Take 1 tablet (50 mg total) by mouth daily. 30 tablet 11  . metoprolol succinate (TOPROL-XL) 100 MG 24 hr tablet Take 1 tablet (100 mg total) by mouth daily. Take with or immediately following a meal. 30 tablet 11  . Multiple Vitamins-Minerals (MULTIVITAMIN WITH MINERALS) tablet Take 1 tablet by mouth daily.    . Omega-3 Fatty Acids (OMEGA-3 FISH OIL) 1200 MG CAPS Take 1 capsule by mouth at bedtime.    . potassium chloride (KLOR-CON M10) 10 MEQ tablet Take 1 tablet (10 mEq total) by mouth daily. 90 tablet 3  . sildenafil (REVATIO) 20 MG tablet TAKE 1-5 TABLETS BY MOUTH AS NEEDED FOR SEXUAL ACTIVITY 260 tablet 0   No current facility-administered medications for this visit.    Allergies:   Patient has no known allergies.    Social History:  The patient  reports that he has never smoked. He has never used smokeless tobacco. He reports current alcohol use of about 14.0 standard drinks of alcohol per week. He reports that he does not use drugs.   Family History:  The patient's family history includes Cancer in his brother.    ROS:  Please see the history of present illness.    Physical Exam: Blood pressure 104/80, pulse 80, height 6\' 2"  (1.88 m), weight 174 lb (78.9 kg), SpO2 96 %.  GEN:  Middle age, man.  NAD  HEENT: Normal NECK: No JVD; No carotid bruits LYMPHATICS: No lymphadenopathy CARDIAC: RRR , no murmurs, rubs, gallops RESPIRATORY:  Clear to auscultation without rales, wheezing or rhonchi  ABDOMEN: Soft, non-tender, non-distended MUSCULOSKELETAL:  No edema; No deformity  SKIN: Warm and dry NEUROLOGIC:  Alert and oriented x 3    EKG: October 11, 2019: Normal sinus rhythm at 78.  Occasional premature ventricular contraction.  Recent Labs: 03/12/2019: NT-Pro BNP 426 10/11/2019: ALT 16; BUN 30; Creatinine, Ser 1.78; Potassium 4.1; Sodium 136    Lipid Panel    Component Value Date/Time   CHOL 170 10/11/2019 1511   TRIG 177 (H) 10/11/2019 1511   HDL 58 10/11/2019 1511   CHOLHDL 2.9 10/11/2019 1511   LDLCALC 82 10/11/2019 1511      Wt Readings from Last 3 Encounters:  10/11/19 174 lb (78.9 kg)  10/01/18 173 lb 1.9 oz (78.5 kg)  02/27/18 178 lb (80.7 kg)      Other studies Reviewed: Additional studies/ records that were reviewed today include: . Review of the above records demonstrates:    ASSESSMENT AND PLAN:  1. 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  Valve sounds great.  He will continue to take SBE prophylaxis prior to dental surgery.     2. Hypertension -   blood pressure is well controlled.  Continue current medications.  We will check a basic metabolic profile.  We will also get liver enzymes and lipid levels as for screening purposes.   3.  Tachycardia: Heart rate is well controlled.     Current medicines are reviewed at length with the patient today.  The patient does not have concerns regarding medicines.  The following changes have been made:  See above   Signed, Kristeen Miss, MD  10/12/2019 9:35 AM    [facility] [address] [place], [place], [place]  [address] Phone: [phone]; Fax: [phone]

## 2019-10-11 ENCOUNTER — Ambulatory Visit (INDEPENDENT_AMBULATORY_CARE_PROVIDER_SITE_OTHER): Payer: 59 | Admitting: Cardiovascular Disease

## 2019-10-11 ENCOUNTER — Other Ambulatory Visit: Payer: Self-pay

## 2019-10-11 ENCOUNTER — Encounter: Payer: Self-pay | Admitting: Cardiovascular Disease

## 2019-10-11 VITALS — BP 104/80 | HR 80 | Ht 74.0 in | Wt 174.0 lb

## 2019-10-11 DIAGNOSIS — Z13220 Encounter for screening for lipoid disorders: Secondary | ICD-10-CM

## 2019-10-11 DIAGNOSIS — Z9889 Other specified postprocedural states: Secondary | ICD-10-CM

## 2019-10-11 DIAGNOSIS — I10 Essential (primary) hypertension: Secondary | ICD-10-CM | POA: Diagnosis not present

## 2019-10-11 MED ORDER — LOSARTAN POTASSIUM 50 MG PO TABS
50.0000 mg | ORAL_TABLET | Freq: Every day | ORAL | 11 refills | Status: DC
Start: 2019-10-11 — End: 2020-10-19

## 2019-10-11 MED ORDER — HYDROCHLOROTHIAZIDE 25 MG PO TABS
25.0000 mg | ORAL_TABLET | Freq: Every day | ORAL | 11 refills | Status: DC
Start: 2019-10-11 — End: 2019-10-21

## 2019-10-11 MED ORDER — METOPROLOL SUCCINATE ER 100 MG PO TB24: 100.0000 mg | ORAL_TABLET | Freq: Every day | ORAL | 11 refills | Status: AC

## 2019-10-11 NOTE — Patient Instructions (Signed)
Medication Instructions:  Your physician recommends that you continue on your current medications as directed. Please refer to the Current Medication list given to you today.  *If you need a refill on your cardiac medications before your next appointment, please call your pharmacy*   Lab Work: TODAY - cholesterol, liver panel, basic metabolic panel If you have labs (blood work) drawn today and your tests are completely normal, you will receive your results only by: MyChart Message (if you have MyChart) OR A paper copy in the mail If you have any lab test that is abnormal or we need to change your treatment, we will call you to review the results.   Testing/Procedures: None Ordered   Follow-Up: At [facility], you and your health needs are our priority.  As part of our continuing mission to provide you with exceptional heart care, we have created designated Provider Care Teams.  These Care Teams include your primary Cardiologist (physician) and Advanced Practice Providers (APPs -  Physician Assistants and Nurse Practitioners) who all work together to provide you with the care you need, when you need it.  Your next appointment:   1 year(s)  The format for your next appointment:   In Person  Provider:   You may see Philip Nahser, MD or one of the following Advanced Practice Providers on your designated Care Team:   Scott Weaver, PA-C Vin Bhagat, PA-C   

## 2019-10-12 LAB — HEPATIC FUNCTION PANEL
ALT: 16 IU/L (ref 0–44)
AST: 20 IU/L (ref 0–40)
Albumin: 4.1 g/dL (ref 3.8–4.8)
Alkaline Phosphatase: 42 IU/L — ABNORMAL LOW (ref 44–121)
Bilirubin Total: 0.5 mg/dL (ref 0.0–1.2)
Bilirubin, Direct: 0.16 mg/dL (ref 0.00–0.40)
Total Protein: 6.5 g/dL (ref 6.0–8.5)

## 2019-10-12 LAB — BASIC METABOLIC PANEL
BUN/Creatinine Ratio: 17 (ref 10–24)
BUN: 30 mg/dL — ABNORMAL HIGH (ref 8–27)
CO2: 27 mmol/L (ref 20–29)
Calcium: 9.3 mg/dL (ref 8.6–10.2)
Chloride: 98 mmol/L (ref 96–106)
Creatinine, Ser: 1.78 mg/dL — ABNORMAL HIGH (ref 0.76–1.27)
GFR calc Af Amer: 45 mL/min/{1.73_m2} — ABNORMAL LOW (ref >59)
GFR calc non Af Amer: 39 mL/min/{1.73_m2} — ABNORMAL LOW (ref >59)
Glucose: 102 mg/dL — ABNORMAL HIGH (ref 65–99)
Potassium: 4.1 mmol/L (ref 3.5–5.2)
Sodium: 136 mmol/L (ref 134–144)

## 2019-10-12 LAB — LIPID PANEL
Chol/HDL Ratio: 2.9 ratio (ref 0.0–5.0)
Cholesterol, Total: 170 mg/dL (ref 100–199)
HDL: 58 mg/dL (ref >39)
LDL Chol Calc (NIH): 82 mg/dL (ref 0–99)
Triglycerides: 177 mg/dL — ABNORMAL HIGH (ref 0–149)
VLDL Cholesterol Cal: 30 mg/dL (ref 5–40)

## 2019-10-21 MED ORDER — HYDROCHLOROTHIAZIDE 25 MG PO TABS: 12.5000 mg | ORAL_TABLET | Freq: Every day | ORAL | 3 refills | Status: AC

## 2019-10-21 NOTE — Addendum Note (Signed)
Addended by: Levi Aland on: 10/21/2019 04:12 PM   Modules accepted: Orders

## 2019-10-31 ENCOUNTER — Other Ambulatory Visit: Payer: Self-pay | Admitting: Cardiovascular Disease

## 2019-11-13 ENCOUNTER — Other Ambulatory Visit: Payer: 59 | Admitting: *Deleted

## 2019-11-13 ENCOUNTER — Other Ambulatory Visit: Payer: Self-pay

## 2019-11-13 DIAGNOSIS — Z9889 Other specified postprocedural states: Secondary | ICD-10-CM

## 2019-11-13 DIAGNOSIS — I10 Essential (primary) hypertension: Secondary | ICD-10-CM

## 2019-11-14 LAB — BASIC METABOLIC PANEL
BUN/Creatinine Ratio: 14 (ref 10–24)
BUN: 20 mg/dL (ref 8–27)
CO2: 27 mmol/L (ref 20–29)
Calcium: 9.4 mg/dL (ref 8.6–10.2)
Chloride: 100 mmol/L (ref 96–106)
Creatinine, Ser: 1.45 mg/dL — ABNORMAL HIGH (ref 0.76–1.27)
GFR calc Af Amer: 58 mL/min/{1.73_m2} — ABNORMAL LOW (ref >59)
GFR calc non Af Amer: 50 mL/min/{1.73_m2} — ABNORMAL LOW (ref >59)
Glucose: 192 mg/dL — ABNORMAL HIGH (ref 65–99)
Potassium: 3.9 mmol/L (ref 3.5–5.2)
Sodium: 140 mmol/L (ref 134–144)

## 2020-10-19 ENCOUNTER — Other Ambulatory Visit: Payer: Self-pay | Admitting: Cardiovascular Disease

## 2021-07-22 ENCOUNTER — Encounter: Payer: Self-pay | Admitting: Cardiovascular Disease
